# Patient Record
Sex: Female | Born: 1985 | Race: White | Hispanic: No | Marital: Single | Smoking: Never smoker
Health system: Southern US, Community
[De-identification: ages and names within clinical notes are randomized; demographics above are authoritative.]

## PROBLEM LIST (undated history)

## (undated) DIAGNOSIS — F319 Bipolar disorder, unspecified: Secondary | ICD-10-CM

## (undated) DIAGNOSIS — J189 Pneumonia, unspecified organism: Secondary | ICD-10-CM

## (undated) DIAGNOSIS — K219 Gastro-esophageal reflux disease without esophagitis: Secondary | ICD-10-CM

## (undated) DIAGNOSIS — D649 Anemia, unspecified: Secondary | ICD-10-CM

## (undated) DIAGNOSIS — T7840XA Allergy, unspecified, initial encounter: Secondary | ICD-10-CM

## (undated) DIAGNOSIS — F32A Depression, unspecified: Secondary | ICD-10-CM

## (undated) DIAGNOSIS — F419 Anxiety disorder, unspecified: Secondary | ICD-10-CM

## (undated) HISTORY — PX: TONSILLECTOMY: SUR1361

## (undated) HISTORY — DX: Depression, unspecified: F32.A

## (undated) HISTORY — PX: CHOLECYSTECTOMY: SHX55

## (undated) HISTORY — DX: Gastro-esophageal reflux disease without esophagitis: K21.9

## (undated) HISTORY — PX: TUBAL LIGATION: SHX77

## (undated) HISTORY — PX: APPENDECTOMY: SHX54

## (undated) HISTORY — PX: ABDOMINAL HYSTERECTOMY: SHX81

## (undated) HISTORY — DX: Allergy, unspecified, initial encounter: T78.40XA

---

## 2003-09-02 ENCOUNTER — Other Ambulatory Visit: Payer: Self-pay

## 2003-12-08 ENCOUNTER — Other Ambulatory Visit: Payer: Self-pay

## 2004-09-10 ENCOUNTER — Emergency Department: Payer: Self-pay | Admitting: Emergency Medicine

## 2004-10-05 ENCOUNTER — Emergency Department: Payer: Self-pay | Admitting: Internal Medicine

## 2004-12-16 ENCOUNTER — Emergency Department: Payer: Self-pay | Admitting: Emergency Medicine

## 2005-12-30 ENCOUNTER — Emergency Department: Payer: Self-pay | Admitting: Emergency Medicine

## 2007-02-09 ENCOUNTER — Emergency Department: Payer: Self-pay | Admitting: Emergency Medicine

## 2007-04-13 ENCOUNTER — Emergency Department: Payer: Self-pay | Admitting: Emergency Medicine

## 2007-04-15 ENCOUNTER — Emergency Department: Payer: Self-pay | Admitting: General Practice

## 2007-10-06 ENCOUNTER — Emergency Department: Payer: Self-pay | Admitting: Emergency Medicine

## 2008-01-13 ENCOUNTER — Ambulatory Visit: Payer: Self-pay | Admitting: Advanced Practice Midwife

## 2008-06-12 ENCOUNTER — Observation Stay: Payer: Self-pay | Admitting: Obstetrics and Gynecology

## 2008-07-03 ENCOUNTER — Inpatient Hospital Stay: Payer: Self-pay

## 2008-10-20 ENCOUNTER — Emergency Department: Payer: Self-pay | Admitting: Emergency Medicine

## 2008-12-23 ENCOUNTER — Ambulatory Visit: Payer: Self-pay | Admitting: Advanced Practice Midwife

## 2009-01-04 ENCOUNTER — Emergency Department: Payer: Self-pay | Admitting: Emergency Medicine

## 2009-04-18 ENCOUNTER — Observation Stay: Payer: Self-pay | Admitting: Unknown Physician Specialty

## 2009-05-14 ENCOUNTER — Observation Stay: Payer: Self-pay

## 2009-05-30 ENCOUNTER — Observation Stay: Payer: Self-pay | Admitting: Unknown Physician Specialty

## 2009-07-07 ENCOUNTER — Observation Stay: Payer: Self-pay | Admitting: Obstetrics & Gynecology

## 2009-07-13 ENCOUNTER — Observation Stay: Payer: Self-pay

## 2009-07-16 ENCOUNTER — Observation Stay: Payer: Self-pay | Admitting: Unknown Physician Specialty

## 2009-07-22 ENCOUNTER — Observation Stay: Payer: Self-pay | Admitting: Obstetrics and Gynecology

## 2009-07-26 ENCOUNTER — Inpatient Hospital Stay: Payer: Self-pay

## 2009-09-09 ENCOUNTER — Inpatient Hospital Stay: Payer: Self-pay | Admitting: Surgery

## 2009-10-06 ENCOUNTER — Emergency Department: Payer: Self-pay | Admitting: Emergency Medicine

## 2009-10-19 ENCOUNTER — Observation Stay: Payer: Self-pay | Admitting: Internal Medicine

## 2010-02-09 ENCOUNTER — Emergency Department: Payer: Self-pay | Admitting: Emergency Medicine

## 2010-03-03 ENCOUNTER — Emergency Department: Payer: Self-pay | Admitting: Emergency Medicine

## 2010-03-21 ENCOUNTER — Emergency Department: Payer: Self-pay | Admitting: Emergency Medicine

## 2010-03-22 ENCOUNTER — Emergency Department: Payer: Self-pay | Admitting: Emergency Medicine

## 2010-04-01 ENCOUNTER — Emergency Department: Payer: Self-pay | Admitting: Emergency Medicine

## 2010-05-01 ENCOUNTER — Emergency Department: Payer: Self-pay | Admitting: Emergency Medicine

## 2010-05-03 ENCOUNTER — Emergency Department: Payer: Self-pay | Admitting: Emergency Medicine

## 2010-05-07 ENCOUNTER — Emergency Department: Payer: Self-pay | Admitting: Emergency Medicine

## 2010-05-15 ENCOUNTER — Inpatient Hospital Stay: Payer: Self-pay | Admitting: Surgery

## 2010-05-19 LAB — PATHOLOGY REPORT

## 2010-05-22 ENCOUNTER — Emergency Department: Payer: Self-pay | Admitting: Unknown Physician Specialty

## 2010-05-31 ENCOUNTER — Emergency Department: Payer: Self-pay | Admitting: Emergency Medicine

## 2010-07-17 ENCOUNTER — Emergency Department: Payer: Self-pay | Admitting: Unknown Physician Specialty

## 2010-07-29 ENCOUNTER — Emergency Department: Payer: Self-pay | Admitting: Internal Medicine

## 2010-09-18 ENCOUNTER — Emergency Department: Payer: Self-pay | Admitting: Emergency Medicine

## 2010-10-29 ENCOUNTER — Emergency Department: Payer: Self-pay | Admitting: Emergency Medicine

## 2011-01-23 ENCOUNTER — Emergency Department: Payer: Self-pay | Admitting: Emergency Medicine

## 2011-02-02 ENCOUNTER — Emergency Department: Payer: Self-pay | Admitting: Emergency Medicine

## 2011-03-26 ENCOUNTER — Emergency Department: Payer: Self-pay | Admitting: Emergency Medicine

## 2011-03-31 ENCOUNTER — Emergency Department: Payer: Self-pay | Admitting: Emergency Medicine

## 2011-08-04 IMAGING — CT CT ABD-PELV W/ CM
1 of 3 series · 15 of 32 positions shown, 19 images · non-contrast
Comparison: none

REASON FOR EXAM: (1) r sided pain; (2) r sided pain
COMMENTS:

[Series 2: 3mm soft tissue · axial · 0.80mm/px · z∈[-769,-355]mm · 15 of 151 slices shown, 19 images]
[im 7/151  soft-tissue]
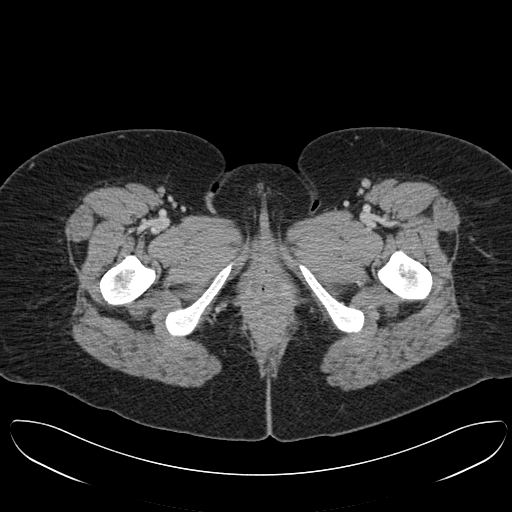
[im 7/151  bone]
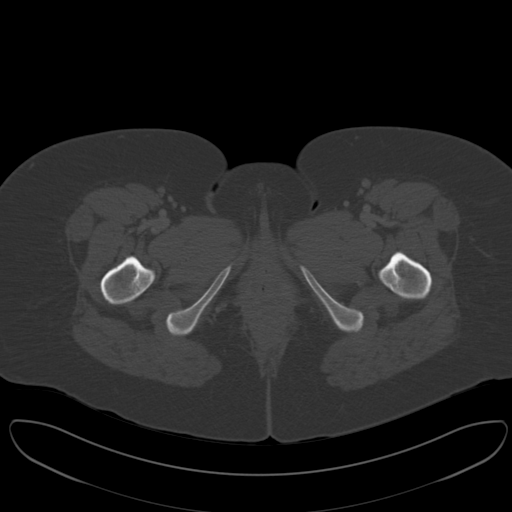
[im 19/151  soft-tissue]
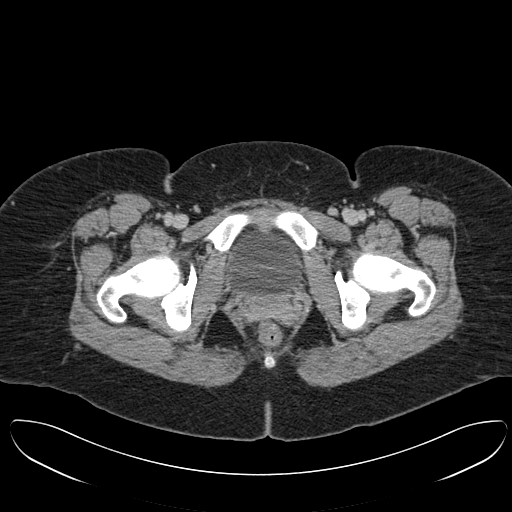
[im 31/151  soft-tissue]
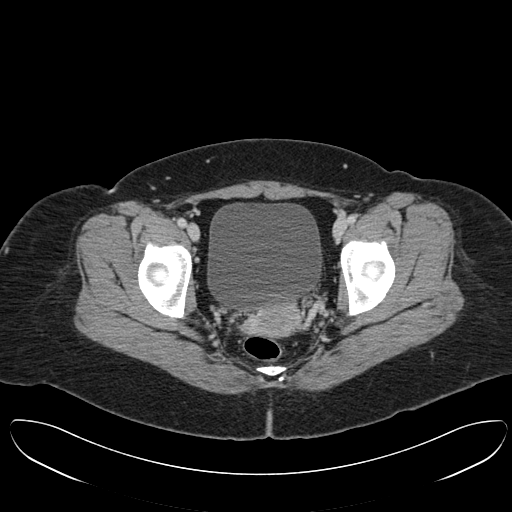
[im 43/151  soft-tissue]
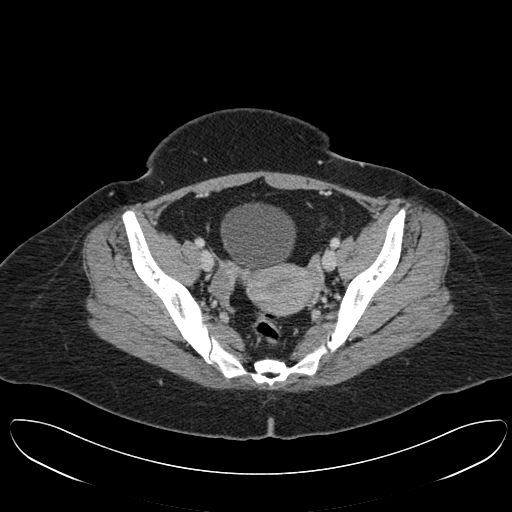
[im 55/151  soft-tissue]
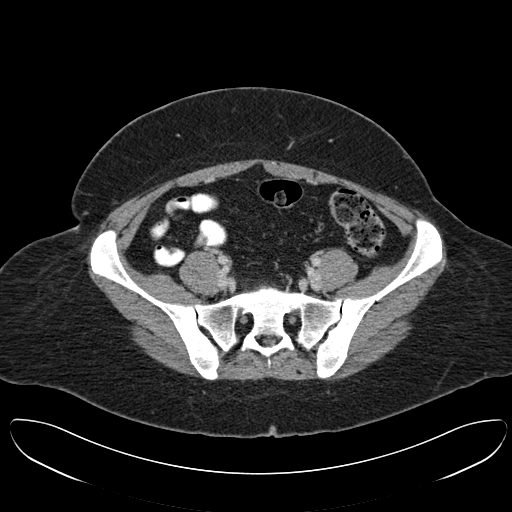
[im 67/151  soft-tissue]
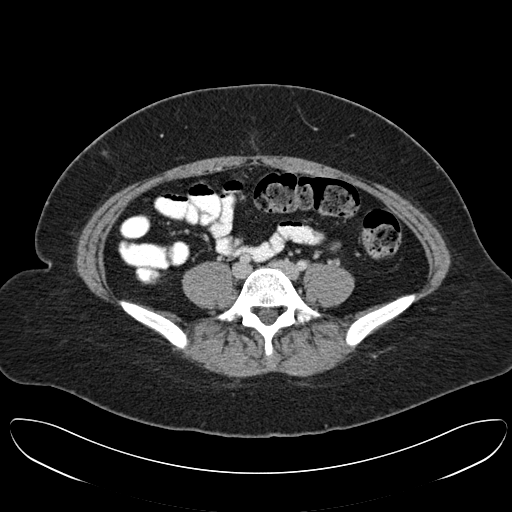
[im 79/151  soft-tissue]
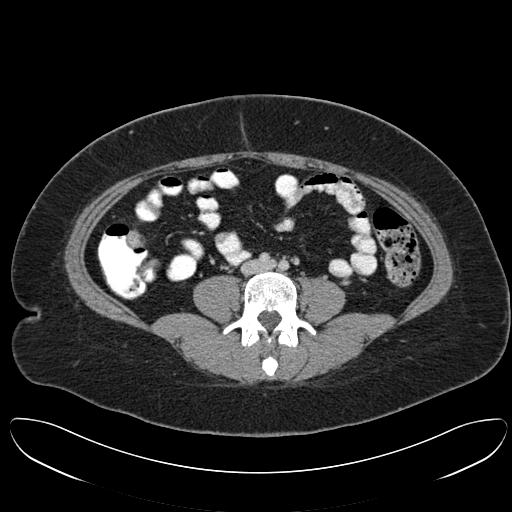
[im 85/151  soft-tissue]
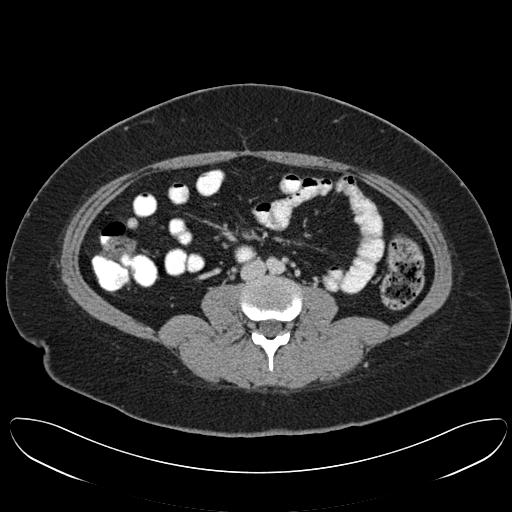
[im 97/151  soft-tissue]
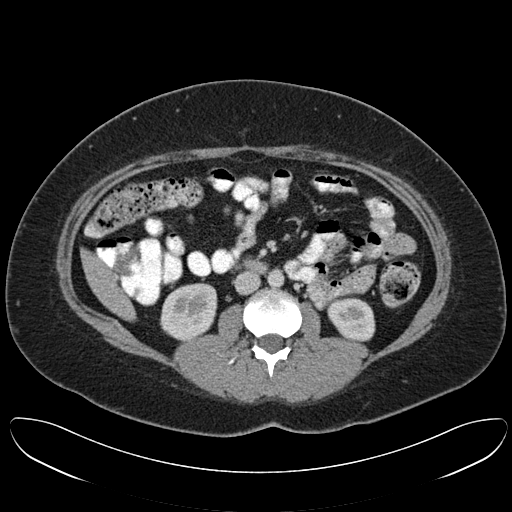
[im 97/151  bone]
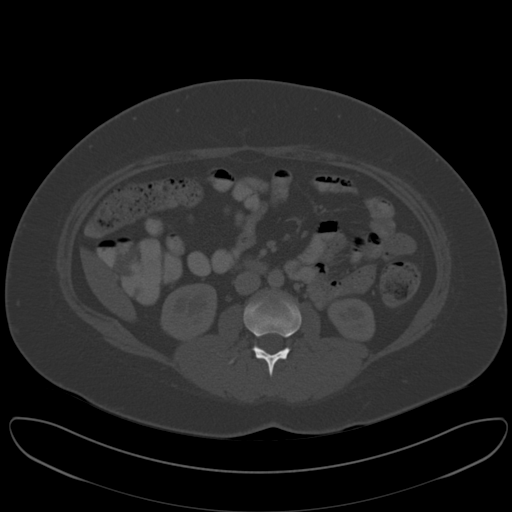
[im 109/151  soft-tissue]
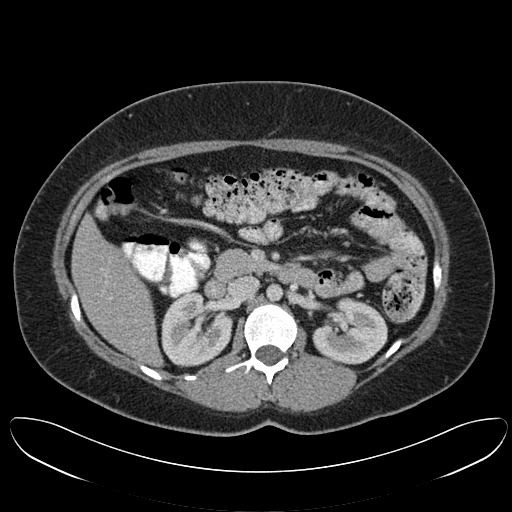
[im 121/151  soft-tissue]
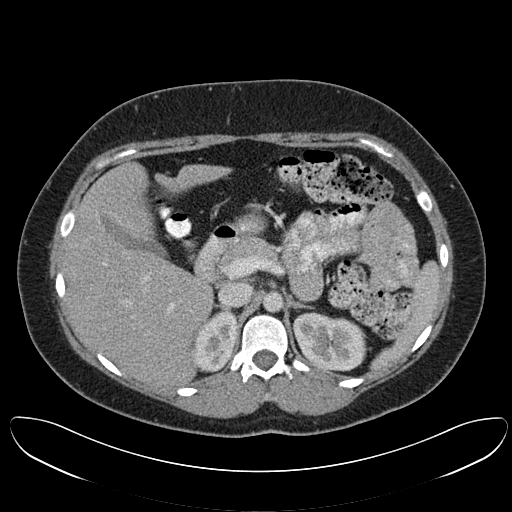
[im 127/151  lung]
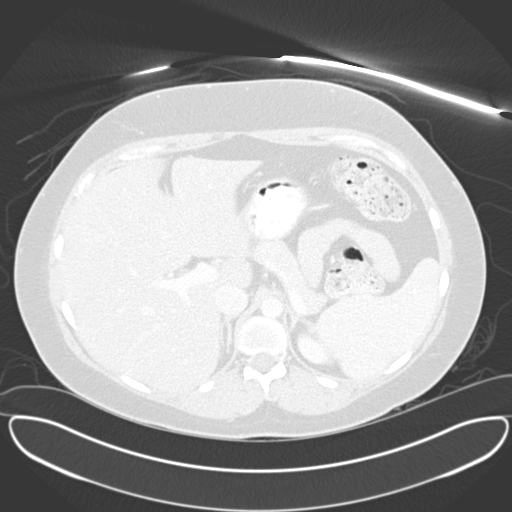
[im 133/151  soft-tissue]
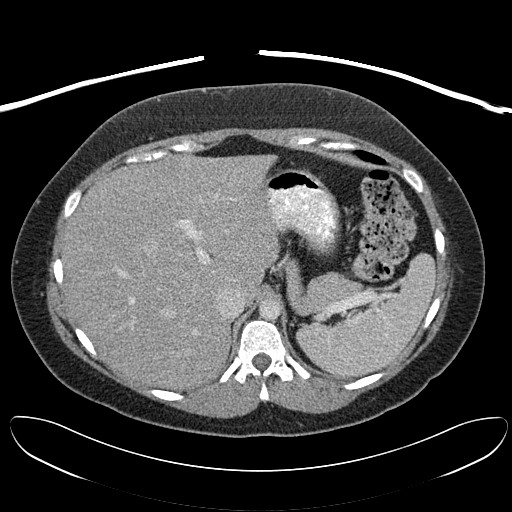
[im 133/151  lung]
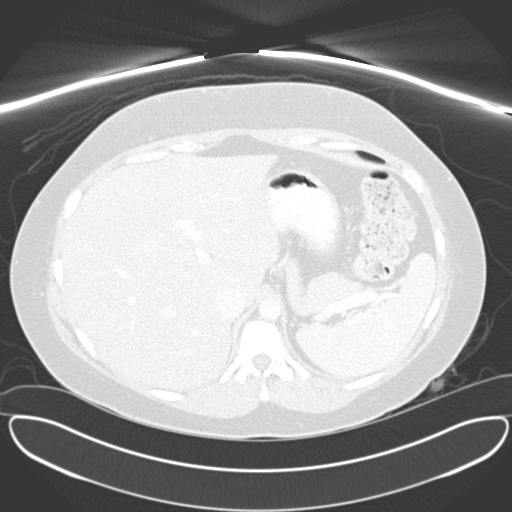
[im 139/151  lung]
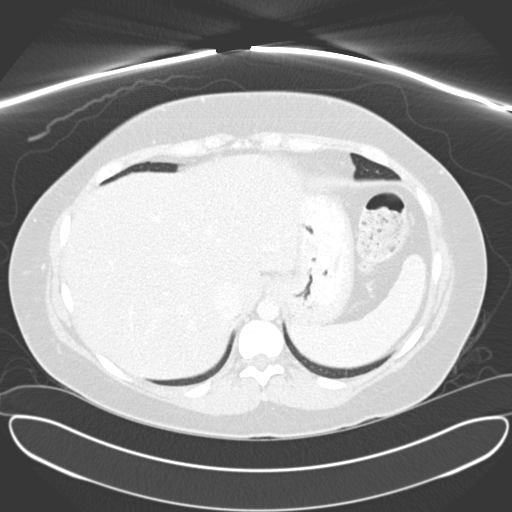
[im 145/151  soft-tissue]
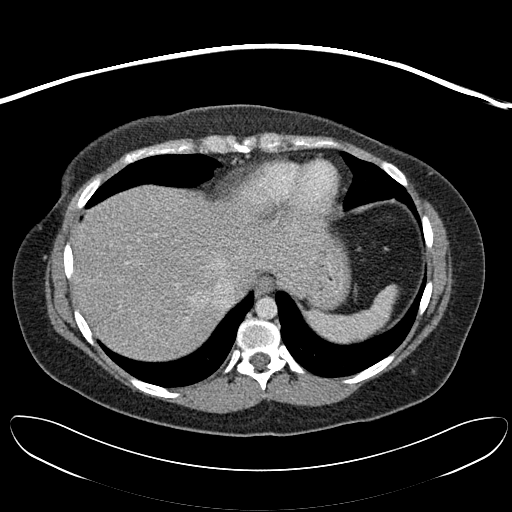
[im 145/151  lung]
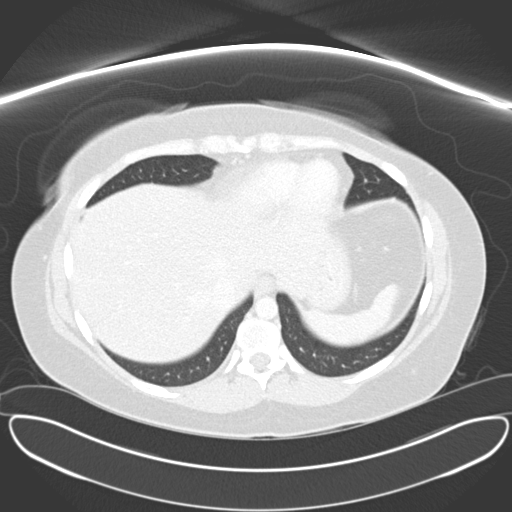

[15 of 32 positions shown; findings below may reference images not displayed]

PROCEDURE:     CT  - CT ABDOMEN / PELVIS  W  - October 19, 2009  [DATE]

RESULT:     CT of the abdomen and pelvis is performed utilizing oral
contrast and 100 ml of Msovue-LYL iodinated intravenous contrast. Images are
reconstructed in the axial plane at 3 mm slice thickness. Comparison is made
to the study of 09/09/2009.

The patient has undergone appendectomy since the previous exam. There is no
abscess or drainable loculated fluid collection. There is no significant
free fluid. The uterus and urinary bladder appear unremarkable. The adnexal
structures are within normal limits. The lung bases appear clear. The liver,
spleen, pancreas, gallbladder, adrenal glands and kidneys appear to be
unremarkable. There may be slightly decreased attenuation of the liver but
no diffuse severe fatty infiltration is present. No adrenal masses are seen.
The abdominal aorta is normal in caliber. There is no abnormal bowel
distention or abnormal bowel wall thickening. There is a moderate amount of
fecal material within the colon especially in the transverse and descending
colonic regions.
IMPRESSION: No significant abdominal or pelvic abnormality. There may
be some mild constipation. Minimal fatty infiltration of the liver is not
excluded.

## 2011-08-27 ENCOUNTER — Emergency Department: Payer: Self-pay | Admitting: *Deleted

## 2011-08-30 ENCOUNTER — Emergency Department: Payer: Self-pay | Admitting: Emergency Medicine

## 2011-09-07 ENCOUNTER — Emergency Department: Payer: Self-pay | Admitting: Emergency Medicine

## 2011-10-06 ENCOUNTER — Emergency Department: Payer: Self-pay | Admitting: Unknown Physician Specialty

## 2012-01-15 ENCOUNTER — Emergency Department: Payer: Self-pay | Admitting: Emergency Medicine

## 2012-01-15 LAB — BASIC METABOLIC PANEL
Anion Gap: 14 (ref 7–16)
BUN: 8 mg/dL (ref 7–18)
Calcium, Total: 8 mg/dL — ABNORMAL LOW (ref 8.5–10.1)
Chloride: 106 mmol/L (ref 98–107)
Creatinine: 0.44 mg/dL — ABNORMAL LOW (ref 0.60–1.30)
EGFR (African American): >60
EGFR (Non-African Amer.): >60
Potassium: 3.3 mmol/L — ABNORMAL LOW (ref 3.5–5.1)
Sodium: 139 mmol/L (ref 136–145)

## 2012-01-15 LAB — URINALYSIS, COMPLETE
Bacteria: NONE SEEN
Blood: NEGATIVE
Glucose,UR: NEGATIVE mg/dL (ref 0–75)
Leukocyte Esterase: NEGATIVE
Nitrite: NEGATIVE
Ph: 6 (ref 4.5–8.0)
Protein: 30
Specific Gravity: 1.028 (ref 1.003–1.030)
Squamous Epithelial: 3
WBC UR: 2 /HPF (ref 0–5)

## 2012-01-15 LAB — CBC
HCT: 33.9 % — ABNORMAL LOW (ref 35.0–47.0)
HGB: 11.6 g/dL — ABNORMAL LOW (ref 12.0–16.0)
MCV: 89 fL (ref 80–100)
Platelet: 247 10*3/uL (ref 150–440)
RBC: 3.82 10*6/uL (ref 3.80–5.20)
RDW: 13.9 % (ref 11.5–14.5)

## 2012-01-15 LAB — PREGNANCY, URINE: Pregnancy Test, Urine: POSITIVE m[IU]/mL

## 2012-01-16 LAB — TSH: Thyroid Stimulating Horm: 0.263 u[IU]/mL — ABNORMAL LOW

## 2012-02-07 ENCOUNTER — Observation Stay: Payer: Self-pay

## 2012-02-14 IMAGING — US ABDOMEN ULTRASOUND
1 series · 17 of 25 positions shown · non-contrast
Comparison: none

REASON FOR EXAM: FLAVIO ROBERTO abd pain
COMMENTS:   May transport without cardiac monitor

[Series 1: abdomen ultrasound · 17 of 50 slices shown]
[im 1/50]
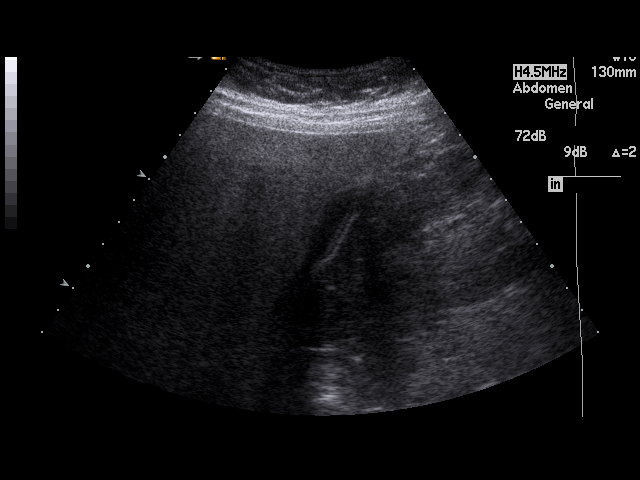
[im 5/50]
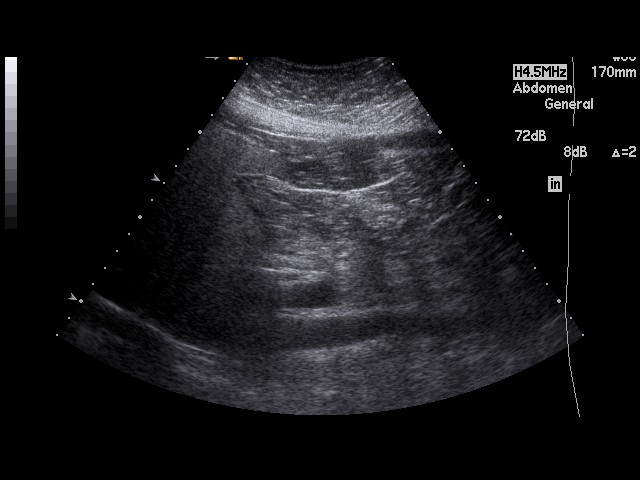
[im 7/50]
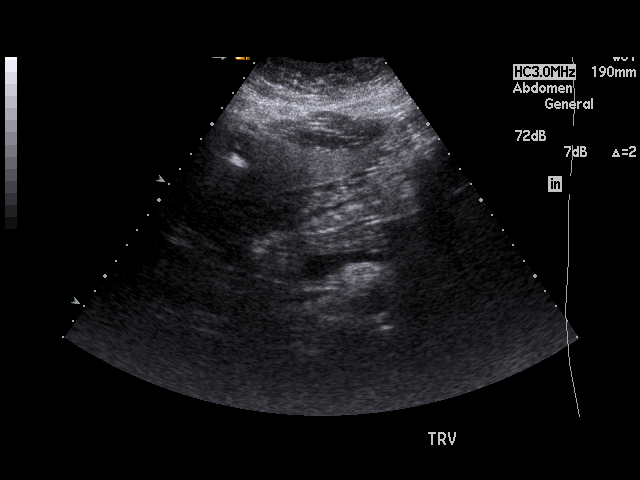
[im 11/50]
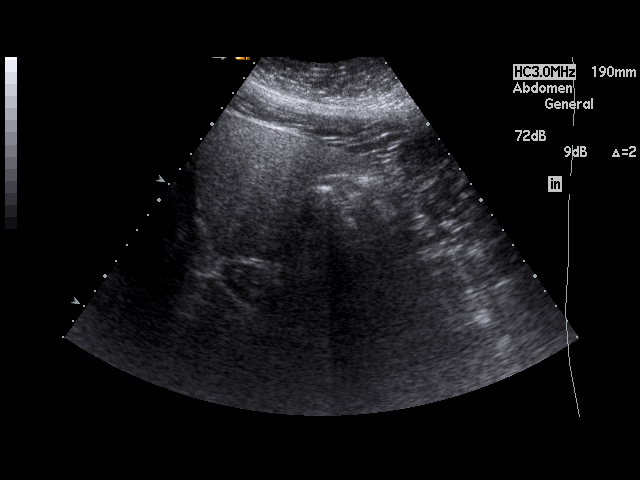
[im 13/50]
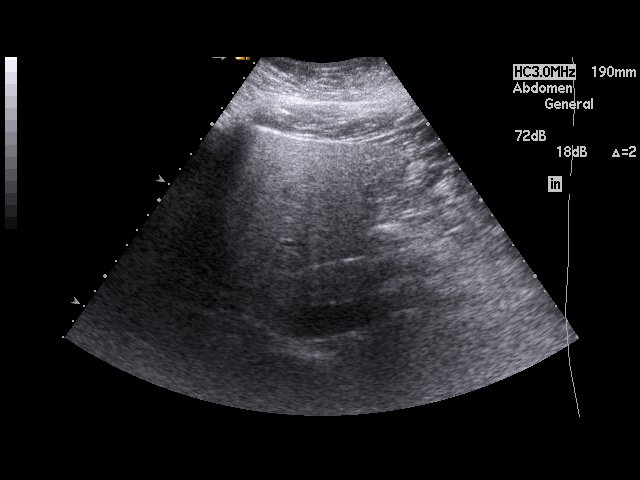
[im 17/50]
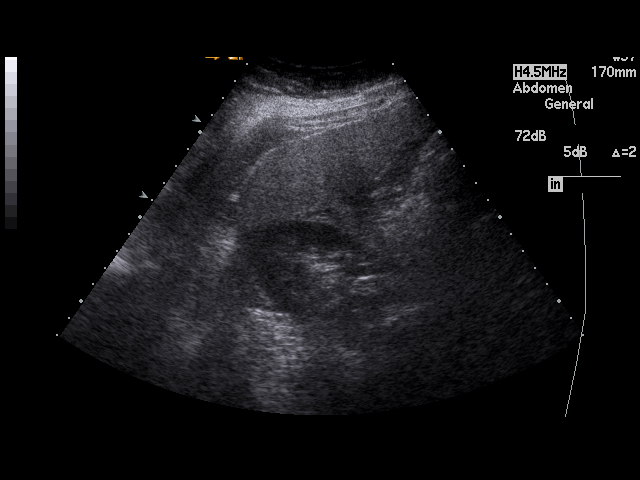
[im 19/50]
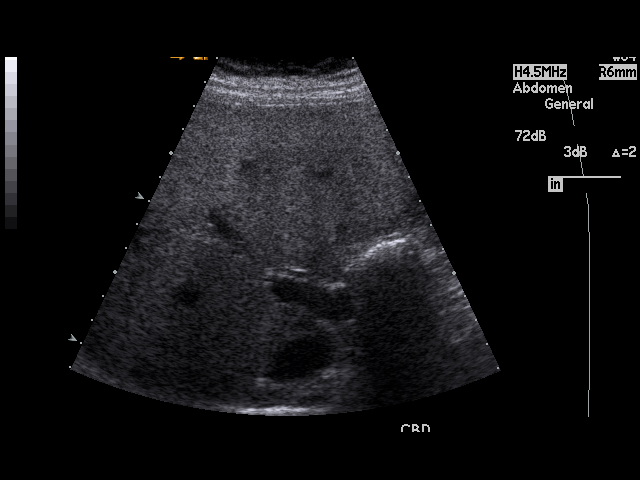
[im 23/50]
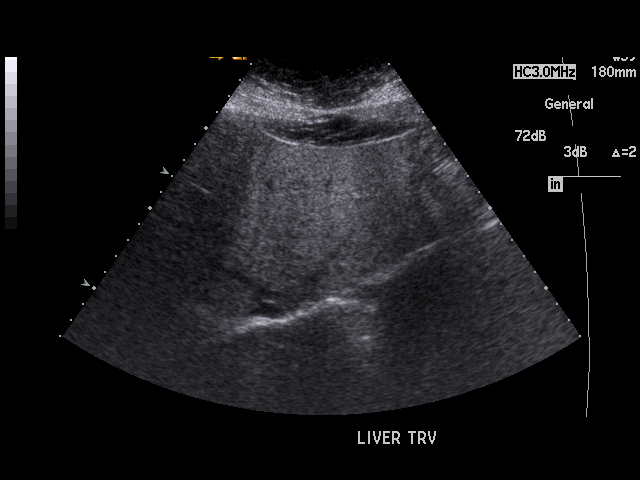
[im 25/50]
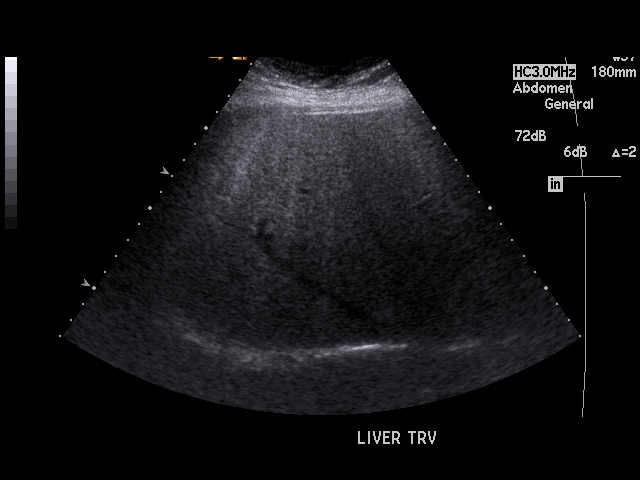
[im 27/50]
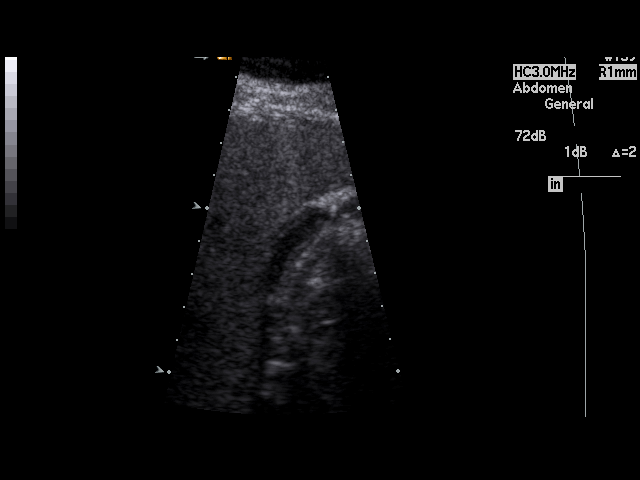
[im 31/50]
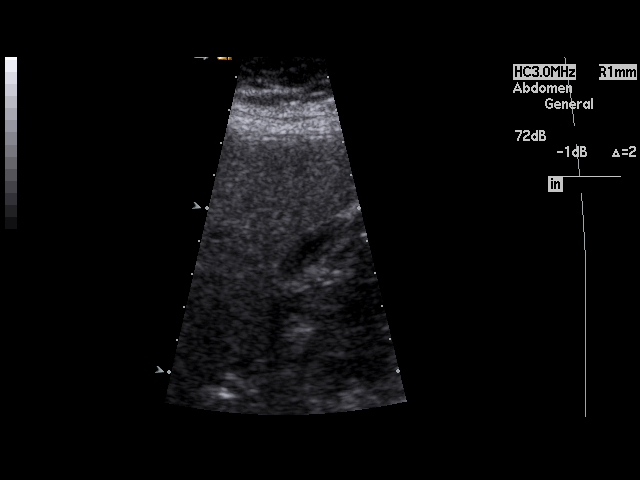
[im 33/50]
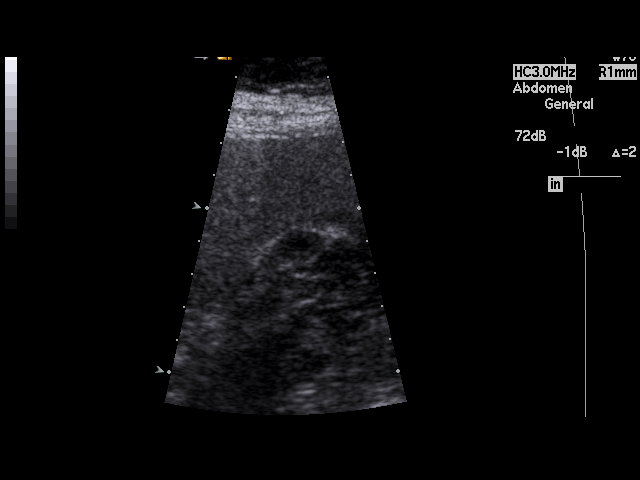
[im 37/50]
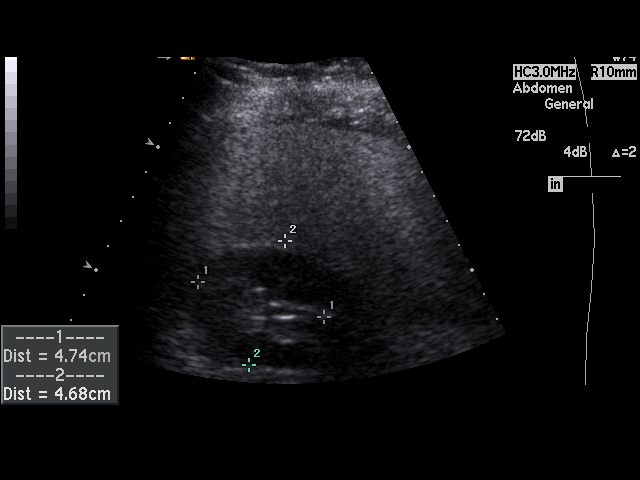
[im 39/50]
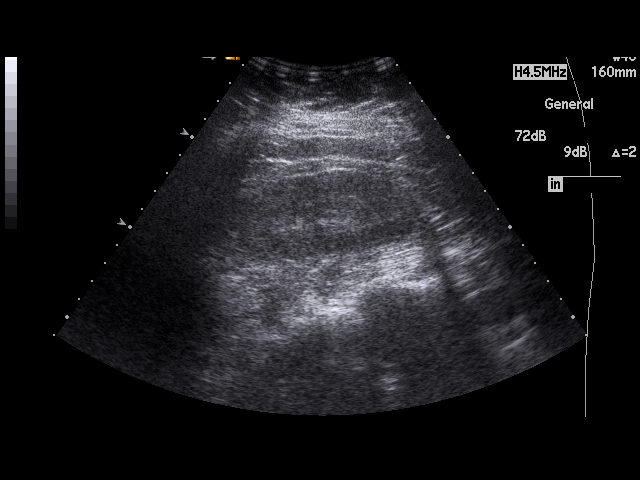
[im 43/50]
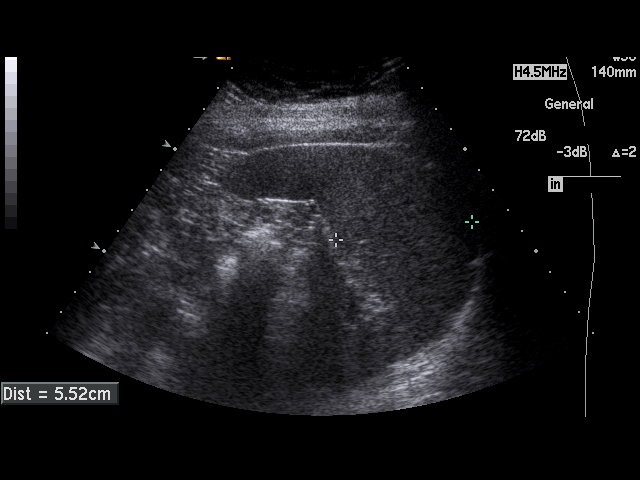
[im 45/50]
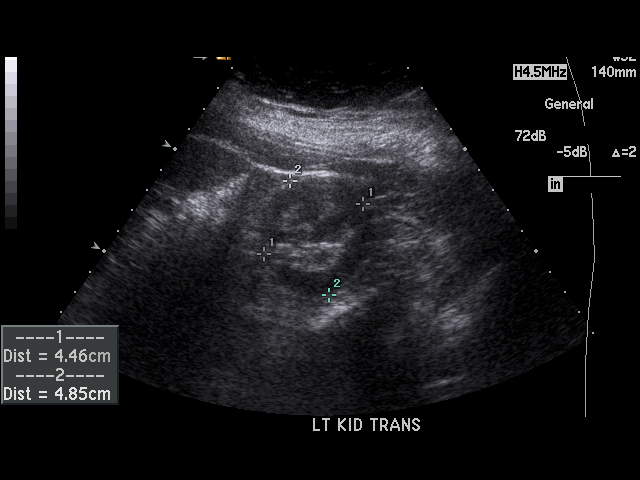
[im 50/50]
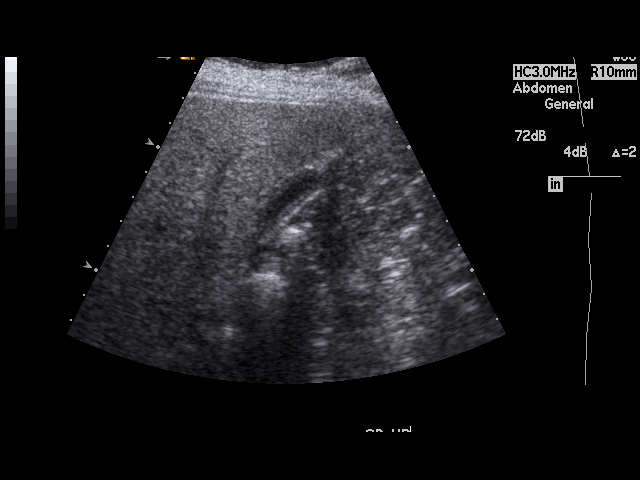

[17 of 25 positions shown; findings below may reference images not displayed]

PROCEDURE:     US  - US ABDOMEN GENERAL SURVEY  - May 01, 2010 [DATE]

RESULT:     Comparison: 09/09/2009.

Technique and Findings:
Multiple grayscale and color Doppler images were obtained of the abdomen.
The visualized aorta and IVC are normal. Visualized pancreas unremarkable.
The liver is relatively echogenic, which can be seen with fatty
infiltration. The common bile duct measures 4 mm in diameter. The
gallbladder is contracted which somewhat limits evaluation. However, there
is no gall bladder wall thickening. The sonographic Murphy's sign was
negative. Limited views of the kidneys show no hydronephrosis. Spleen is
normal in appearance.
IMPRESSION: The gallbladder is contracted, somewhat limiting evaluation. However, no
ultrasound evidence of cholecystitis.

## 2012-02-17 IMAGING — CT CT ABD-PELV W/ CM
1 of 2 series · 15 of 32 positions shown, 19 images · IV contrast (isovue)
Comparison: 10/19/2009

REASON FOR EXAM: (1) severe RUQ abdominal pain; (2) severe abdominal pain
COMMENTS:

PROCEDURE:     CT  - CT ABDOMEN / PELVIS  W  - May 04, 2010  [DATE]
RESULT:     History: Right upper quadrant pain
TECHNIQUE: Multiple axial images of the abdomen and pelvis were performed
from the lung bases to the pubic symphysis, with p.o. contrast and with 100
ml of Isovue 370 intravenous contrast.

[Series 2: abdomen · axial · 0.83mm/px · z∈[-503,-23]mm · 15 of 104 slices shown, 19 images]
[im 4/104  soft-tissue]
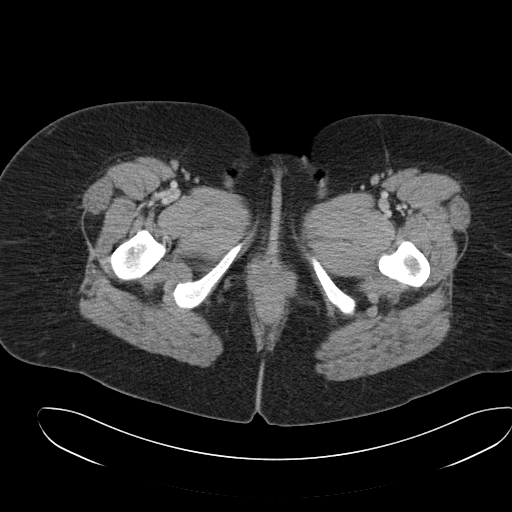
[im 4/104  bone]
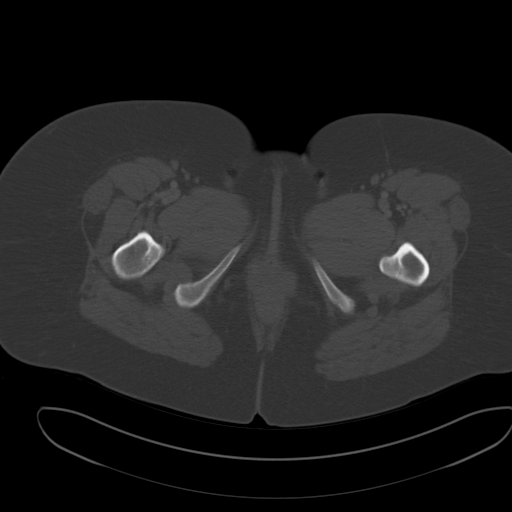
[im 12/104  soft-tissue]
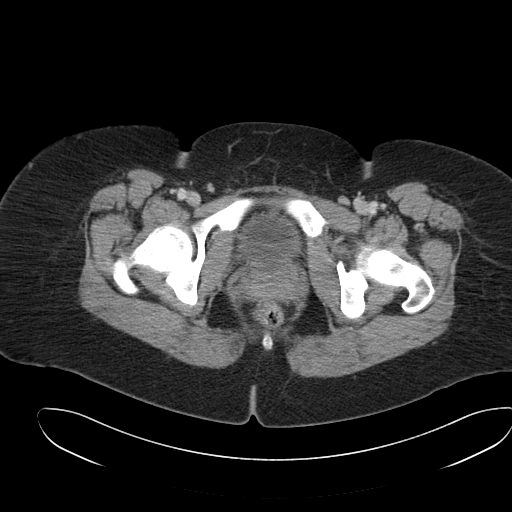
[im 20/104  soft-tissue]
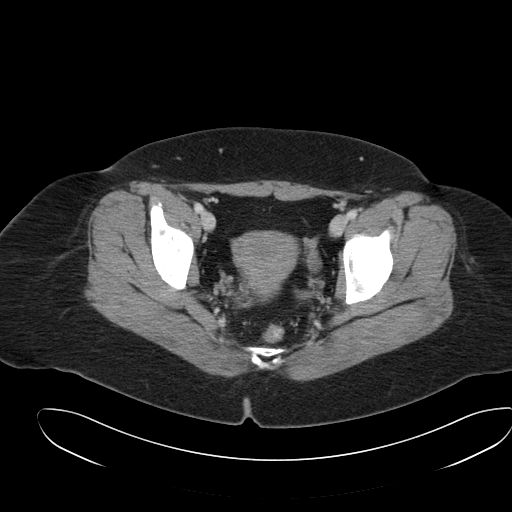
[im 28/104  soft-tissue]
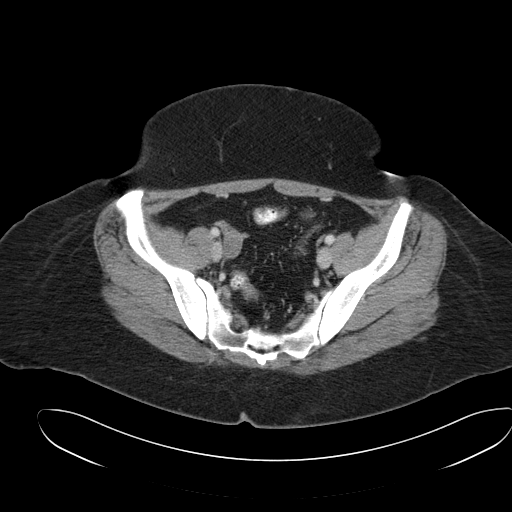
[im 36/104  soft-tissue]
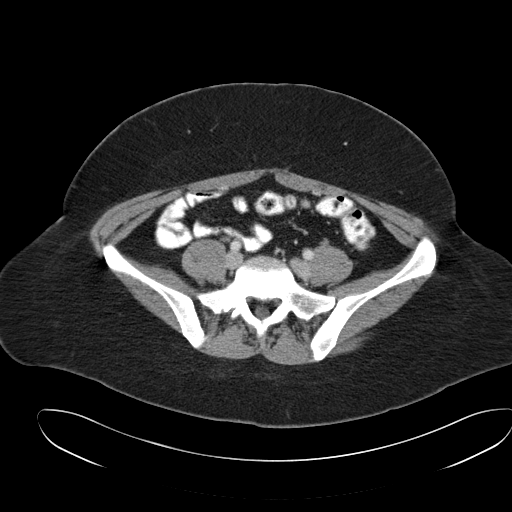
[im 44/104  soft-tissue]
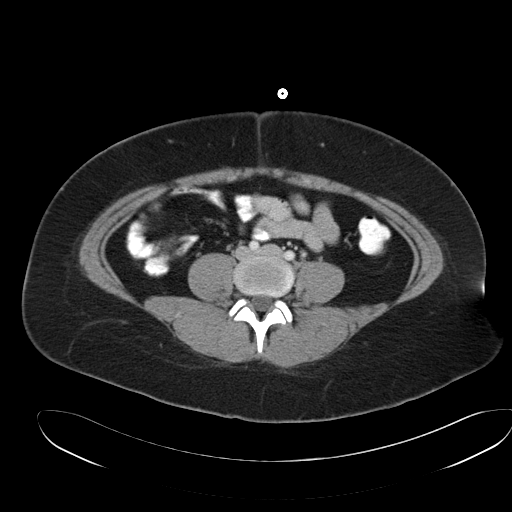
[im 52/104  soft-tissue]
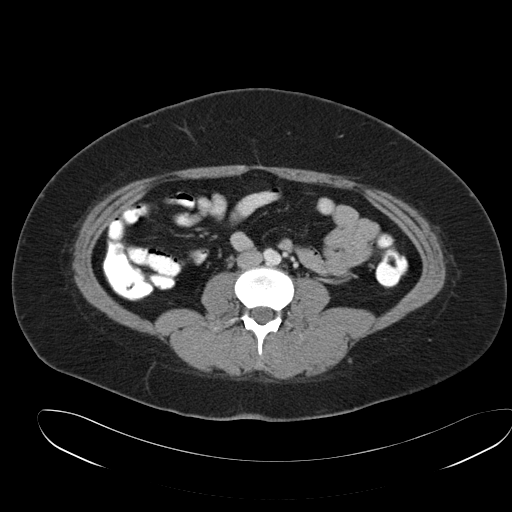
[im 60/104  soft-tissue]
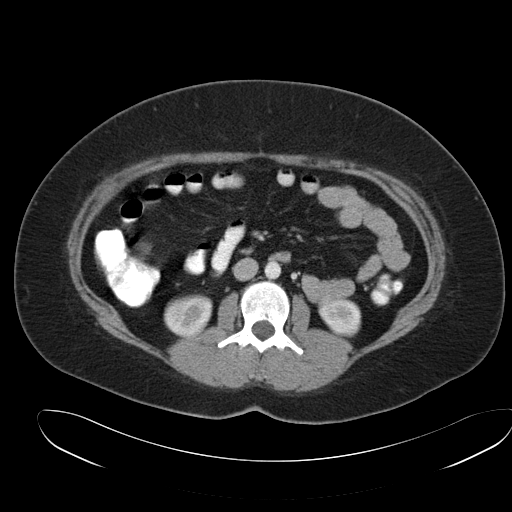
[im 68/104  soft-tissue]
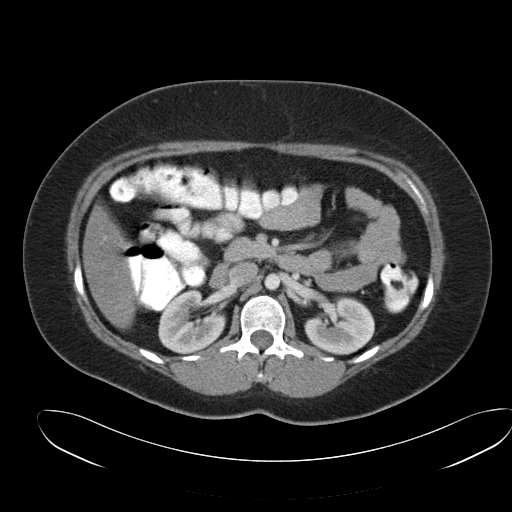
[im 68/104  bone]
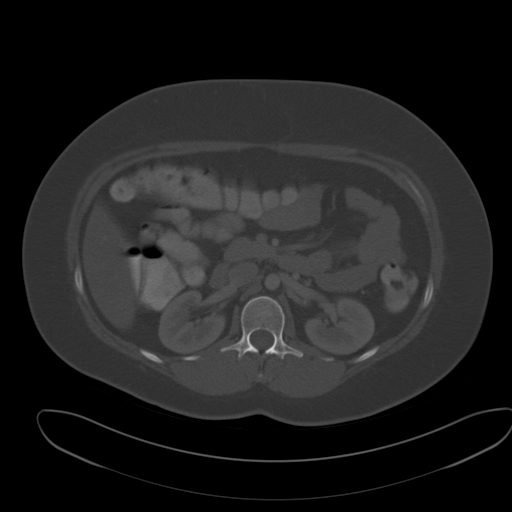
[im 76/104  soft-tissue]
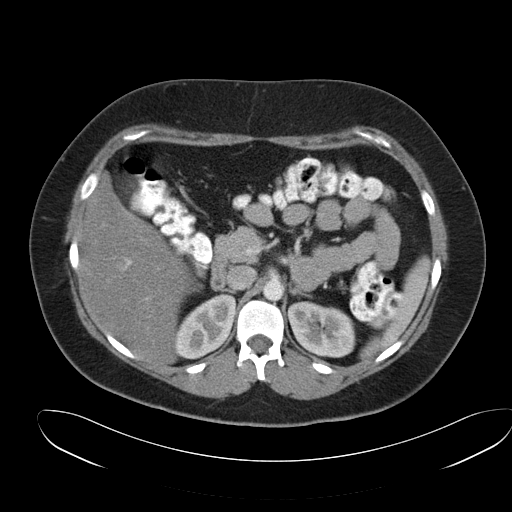
[im 84/104  soft-tissue]
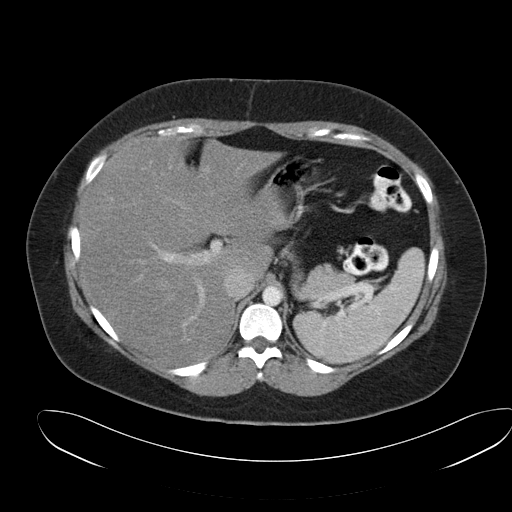
[im 88/104  lung]
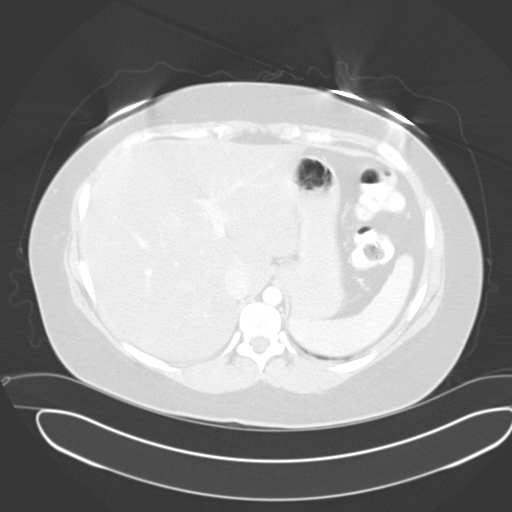
[im 92/104  soft-tissue]
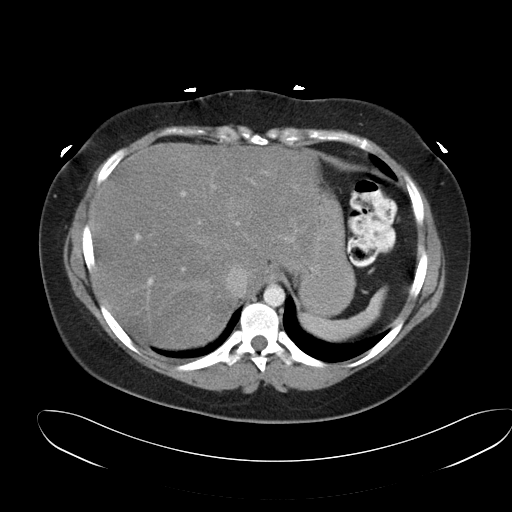
[im 92/104  lung]
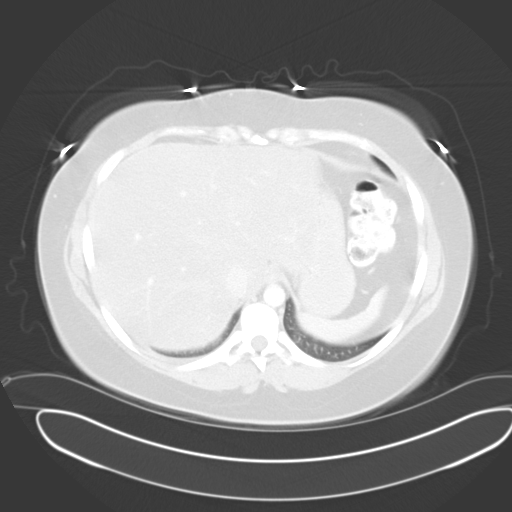
[im 96/104  lung]
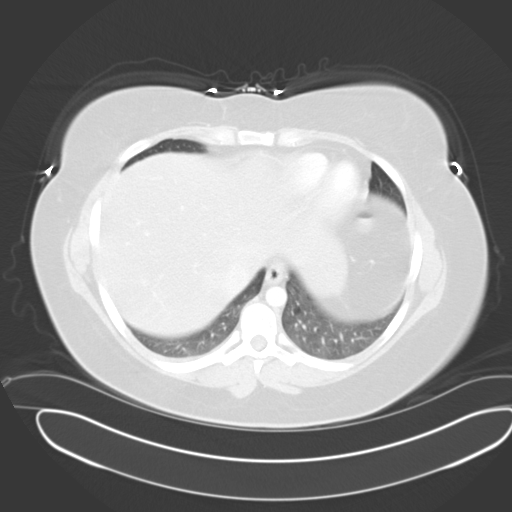
[im 100/104  soft-tissue]
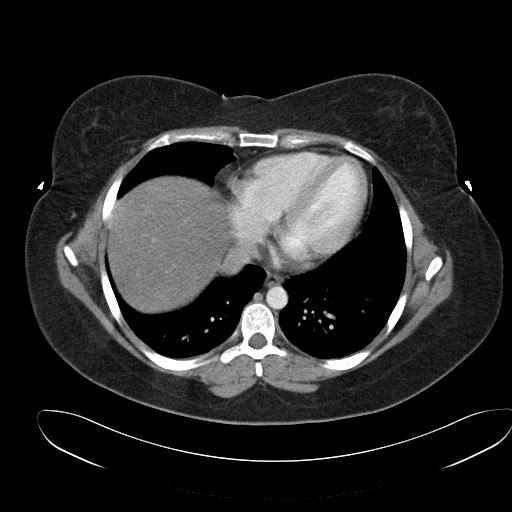
[im 100/104  lung]
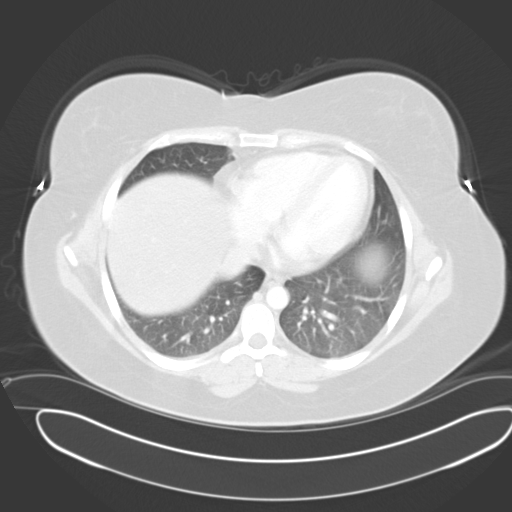

[15 of 32 positions shown; findings below may reference images not displayed]

FINDINGS: The lung bases are clear. There is no pneumothorax. The heart size is
normal.

The liver is diffusely low in attenuation likely secondary to hepatic
steatosis. There is a 5 mm echogenic focus within the distal common bile
duct at the level of the pancreatic head which may be artifactual versus
choledocholithiasis. There is no intrahepatic or extrahepatic biliary ductal
dilatation. The gallbladder is unremarkable. The spleen demonstrates no
focal abnormality. The kidneys, adrenal glands, and pancreas are normal. The
bladder is unremarkable.

The stomach, duodenum, small intestine, and large intestine demonstrate no
contrast extravasation or dilatation.  There is no pneumoperitoneum,
pneumatosis, or portal venous gas. There is no abdominal or pelvic free
fluid. There is no lymphadenopathy.

The abdominal aorta is normal in caliber.

The osseous structures are unremarkable.
IMPRESSION: There is a 5 mm echogenic focus within the distal common bile duct at the
level of the pancreatic head which may be artifactual versus
choledocholithiasis. Recommend further evaluation with M.R.C.P.

Hepatic steatosis.

## 2012-03-06 ENCOUNTER — Observation Stay: Payer: Self-pay | Admitting: Obstetrics and Gynecology

## 2012-03-23 ENCOUNTER — Observation Stay: Payer: Self-pay | Admitting: Advanced Practice Midwife

## 2012-03-23 LAB — URINALYSIS, COMPLETE
Bilirubin,UR: NEGATIVE
Blood: NEGATIVE
Glucose,UR: NEGATIVE mg/dL (ref 0–75)
Ketone: NEGATIVE
Leukocyte Esterase: NEGATIVE
Protein: NEGATIVE

## 2012-03-26 ENCOUNTER — Observation Stay: Payer: Self-pay | Admitting: Obstetrics and Gynecology

## 2012-03-30 ENCOUNTER — Observation Stay: Payer: Self-pay | Admitting: Obstetrics and Gynecology

## 2012-04-04 ENCOUNTER — Observation Stay: Payer: Self-pay

## 2012-04-08 ENCOUNTER — Observation Stay: Payer: Self-pay

## 2012-04-08 LAB — PIH PROFILE
Calcium, Total: 9.5 mg/dL (ref 8.5–10.1)
Creatinine: 0.54 mg/dL — ABNORMAL LOW (ref 0.60–1.30)
EGFR (African American): >60
EGFR (Non-African Amer.): >60
HGB: 11.4 g/dL — ABNORMAL LOW (ref 12.0–16.0)
MCH: 28.8 pg (ref 26.0–34.0)
MCHC: 32.9 g/dL (ref 32.0–36.0)
MCV: 88 fL (ref 80–100)
Osmolality: 275 (ref 275–301)
RBC: 3.95 10*6/uL (ref 3.80–5.20)
RDW: 15 % — ABNORMAL HIGH (ref 11.5–14.5)
SGOT(AST): 12 U/L — ABNORMAL LOW (ref 15–37)
Sodium: 140 mmol/L (ref 136–145)
Uric Acid: 4.3 mg/dL (ref 2.6–6.0)
WBC: 10.2 10*3/uL (ref 3.6–11.0)

## 2012-04-08 LAB — PROTEIN / CREATININE RATIO, URINE
Protein, Random Urine: 8 mg/dL (ref 0–12)
Protein/Creat. Ratio: 303 mg/gCREAT — ABNORMAL HIGH (ref 0–200)

## 2012-04-12 ENCOUNTER — Observation Stay: Payer: Self-pay

## 2012-04-17 ENCOUNTER — Observation Stay: Payer: Self-pay | Admitting: Obstetrics and Gynecology

## 2012-04-21 ENCOUNTER — Observation Stay: Payer: Self-pay | Admitting: Obstetrics and Gynecology

## 2012-04-26 ENCOUNTER — Inpatient Hospital Stay: Payer: Self-pay

## 2012-04-26 LAB — CBC WITH DIFFERENTIAL/PLATELET
Basophil #: 0 10*3/uL (ref 0.0–0.1)
Eosinophil #: 0.1 10*3/uL (ref 0.0–0.7)
Eosinophil %: 1.3 %
HGB: 11.3 g/dL — ABNORMAL LOW (ref 12.0–16.0)
Lymphocyte #: 2.9 10*3/uL (ref 1.0–3.6)
Lymphocyte %: 25.2 %
MCH: 28.8 pg (ref 26.0–34.0)
MCHC: 33.2 g/dL (ref 32.0–36.0)
MCV: 87 fL (ref 80–100)
Monocyte %: 6.6 %
Neutrophil #: 7.7 10*3/uL — ABNORMAL HIGH (ref 1.4–6.5)
Neutrophil %: 66.5 %
RBC: 3.92 10*6/uL (ref 3.80–5.20)
WBC: 11.6 10*3/uL — ABNORMAL HIGH (ref 3.6–11.0)

## 2012-04-27 LAB — HEMATOCRIT: HCT: 32.2 % — ABNORMAL LOW (ref 35.0–47.0)

## 2012-10-04 ENCOUNTER — Emergency Department: Payer: Self-pay | Admitting: Emergency Medicine

## 2012-10-04 LAB — CK TOTAL AND CKMB (NOT AT ARMC)
CK, Total: 77 U/L (ref 21–215)
CK-MB: <0.5 ng/mL — ABNORMAL LOW (ref 0.5–3.6)

## 2012-10-04 LAB — COMPREHENSIVE METABOLIC PANEL
Albumin: 4 g/dL (ref 3.4–5.0)
Alkaline Phosphatase: 114 U/L (ref 50–136)
Anion Gap: 8 (ref 7–16)
Bilirubin,Total: 0.3 mg/dL (ref 0.2–1.0)
Chloride: 108 mmol/L — ABNORMAL HIGH (ref 98–107)
Co2: 24 mmol/L (ref 21–32)
Creatinine: 0.69 mg/dL (ref 0.60–1.30)
Osmolality: 278 (ref 275–301)
Potassium: 3.9 mmol/L (ref 3.5–5.1)
SGOT(AST): 30 U/L (ref 15–37)
Total Protein: 7.9 g/dL (ref 6.4–8.2)

## 2012-10-04 LAB — CBC
HCT: 39.2 % (ref 35.0–47.0)
HGB: 13.9 g/dL (ref 12.0–16.0)
MCHC: 35.4 g/dL (ref 32.0–36.0)
Platelet: 296 10*3/uL (ref 150–440)
RBC: 4.61 10*6/uL (ref 3.80–5.20)

## 2012-10-04 LAB — TROPONIN I: Troponin-I: <0.02 ng/mL

## 2012-12-08 ENCOUNTER — Emergency Department: Payer: Self-pay | Admitting: Emergency Medicine

## 2012-12-08 LAB — URINALYSIS, COMPLETE
Bilirubin,UR: NEGATIVE
Nitrite: NEGATIVE
Ph: 7 (ref 4.5–8.0)
Protein: 100
Specific Gravity: 1.019 (ref 1.003–1.030)
Squamous Epithelial: 17

## 2012-12-08 LAB — CBC
HCT: 39.8 % (ref 35.0–47.0)
MCH: 29 pg (ref 26.0–34.0)
MCHC: 33.3 g/dL (ref 32.0–36.0)
Platelet: 307 10*3/uL (ref 150–440)
RBC: 4.57 10*6/uL (ref 3.80–5.20)
RDW: 14.6 % — ABNORMAL HIGH (ref 11.5–14.5)
WBC: 9.5 10*3/uL (ref 3.6–11.0)

## 2012-12-08 LAB — WET PREP, GENITAL

## 2013-02-08 ENCOUNTER — Emergency Department: Payer: Self-pay | Admitting: Emergency Medicine

## 2013-05-27 ENCOUNTER — Emergency Department: Payer: Self-pay | Admitting: Emergency Medicine

## 2013-07-17 ENCOUNTER — Emergency Department: Payer: Self-pay | Admitting: Emergency Medicine

## 2014-06-12 ENCOUNTER — Emergency Department: Payer: Self-pay | Admitting: Emergency Medicine

## 2014-08-01 ENCOUNTER — Emergency Department: Payer: Self-pay | Admitting: Emergency Medicine

## 2015-02-12 NOTE — Op Note (Signed)
PATIENT NAME:  Melanie Whitaker, Melanie Whitaker MR#:  [id number] DATE OF BIRTH:  Nov 03, 1985  DATE OF PROCEDURE:  04/27/2012  PREOPERATIVE DIAGNOSIS: Desires sterility.   POSTOPERATIVE DIAGNOSIS: Desires sterility.   PROCEDURE: Postpartum tubal ligation, modified [place] method.   SURGEON: Donzetta Matters, M.D.   ASSISTANT: None.   ANESTHESIA:  General.   ESTIMATED BLOOD LOSS: Minimal.  OPERATIVE FLUIDS: 300 mL.  COMPLICATIONS: None.   FINDINGS: Normal-appearing tubes.  SPECIMEN TYPE: Portion of right and left tube.   INDICATIONS: The patient is a 29 year old who is postpartum day one from vaginal delivery who desires sterility. Risks, benefits, indications, and alternatives of the procedure were explained and informed consent was obtained.   PROCEDURE: The patient was taken to the Operating Room with IV fluids running. She was prepped and draped in the usual sterile fashion in the supine position. The infraumbilical region was injected with 0.5% Sensorcaine. A horizontal infraumbilical incision was made. The fascia was grasped with Kocher's and entered sharply. Two pieces of #0 Vicryl were placed on the fascia for repair at the end of the case. The peritoneum was entered bluntly. The patient's left tube was grasped with a Babcock and followed up to the fimbriated end. The Kary Kos was used to grasp an area 2 to 3 cm lateral to the uterine cornu where an avascular window in the mesosalpinx was identified and entered with Bovie cautery. Two pieces of plain gut suture were passed through this opening and the tube was tied 2 to 3 cm lateral to the uterine cornu. The tube was cut. The cut edge was made hemostatic and the tube was returned to the abdomen. This was repeated on the patient's right tube. The previously placed #0 Vicryl was used to repair the fascia and the skin was closed with 4-0 Vicryl in a subcuticular fashion. The patient tolerated the procedure well. Sponge, needle and instrument counts were  correct x2. The patient was awakened from anesthesia and taken to the recovery room in stable condition.  ____________________________ Rolm Gala Ferne Reus, MD law:ap D: 04/27/2012 16:26:06 ET T: 04/28/2012 08:28:31 ET JOB#: [id number]  cc: Sherlynn Carbon A. Ferne Reus, MD, <Dictator>  Rolm Gala WEAVER LEE MD ELECTRONICALLY SIGNED 06/05/2012 10:22

## 2015-02-28 NOTE — H&P (Signed)
L&D Evaluation:  History Expanded:   HPI 29 yo G3P2002 with EDD of 04/24/12 per 17 week [place]. Presented with c/o contractions at 35 1/7 weeks. Per RN her cervix was 1.5 cm. Ctx resolved for about 1 hour after terbutaline injection and then resumed. IV fluid bolus given and stadol injection. Contractions are currently sporadic, pt feeling "drunk" from stadol, but does not feel intense contractions. Erythromycin 900 mg given IV for GBS prophylaxis. + FM, no VB.    Blood Type O positive    Group B Strep Results (Result >5wks must be treated as unknown) unknown/result > 5 weeks ago     Maternal HIV Negative    Maternal Syphilis Ab Nonreactive    Maternal Varicella Immune    Rubella Results immune    Maternal T-Dap Immune    Presents with contractions    Patient's Medical History depression with hospitalization, HSV     Patient's Surgical History tonsillectomy     Medications Pre Natal Vitamins     Allergies PCN, Sulfa, Amoxicillin, Phenergan    Social History none    ROS:   ROS see HPI   Exam:   Vital Signs stable     General no apparent distress    Mental Status clear     Abdomen gravid, non-tender    Pelvic no external lesions, 1.5-2/80/-2    Mebranes Intact    FHT normal rate with no decels    Ucx irregular   Impression:   Impression preterm labor - resolved   Plan:   Plan discharge   Electronic Signatures: Ander Purpura (CNM)  (Signed 03-Jun-13 08:52)  Authored: L&D Evaluation   Last Updated: 03-Jun-13 08:52 by Ander Purpura (CNM)

## 2015-02-28 NOTE — H&P (Signed)
L&D Evaluation:  History Expanded:   HPI 29 yo [facility] patient who comes in to labor and delivery thinking she is in labor cervix 2-3 but contracting. 37 mweeks Pt has a history of depression with suicide attempt.no prob since 20054, stable bow, but set up for PP depression, she haS a history of herpes, she has been on Valtrex for 2 weeks and does not currently have an outbreak Desires BTL,    [place] 3    Term 2    PreTerm 0    Abortion 0    Living 2    Blood Type O positive    Group B Strep Results (Result >5wks must be treated as unknown) negative    Maternal HIV Negative    Maternal Syphilis Ab Nonreactive    Maternal Varicella Immune    Rubella Results immune    Maternal T-Dap Immune    [facility] 24-Apr-2012    Presents with contractions    Patient's Medical History No Chronic Illness    Patient's Surgical History Appendectomy  Colecystectomy  tonsils    Medications Pre Natal Vitamins    Allergies PCN, Sulfa, phenergan causes hallucinations    Social History none    Family History Non-Contributory   ROS:   ROS All systems were reviewed.  HEENT, CNS, GI, GU, Respiratory, CV, Renal and Musculoskeletal systems were found to be normal.   Exam:   Vital Signs stable    General no apparent distress    Mental Status clear    Chest clear    Heart normal sinus rhythm    Abdomen gravid, tender with contractions    Estimated Fetal Weight Small for gestational age    Fetal Position vertex    Pelvic no external lesions, 2-3    Mebranes Intact    FHT normal rate with no decels   Impression:   Impression early labor   Plan:   Plan monitor contractions and for cervical change    Comments allow to labor and see if cervix changes.    Follow Up Appointment already scheduled   Electronic Signatures: Erik Obey (MD)  (Signed 07-Jul-13 17:24)  Authored: L&D Evaluation   Last Updated: 07-Jul-13 17:24 by Erik Obey (MD)

## 2015-02-28 NOTE — H&P (Signed)
L&D Evaluation:  History Expanded:   HPI 29 yo G3P2002 with EDD of 04/24/12 per 17 week [place]. Presented at 37 4/7 weeks for [facility] evaluation from [facility]. BP 140/100 and 136/90 today at office with 1+ protein urine dip, recent PC Ratio around 300 (per report, lab not available for review today). Pt denies ha, reports occasional "spots" in vision. Decreased but regular FM. Also c/o regular, mild-moderate contractions. No LOF of VB. PNC at [facility] notable for early entry to care, elevated TSH with normal repeat, elevated 1 hr with normal 3 hour and labile BPs, HSV - taking Acyclovir for prophylaxis. OB Hx: SVD x 2    Blood Type O positive    Group B Strep Results (Result >5wks must be treated as unknown) unknown/result > 5 weeks ago    Maternal HIV Negative    Maternal Syphilis Ab Nonreactive    Maternal Varicella Immune    Rubella Results immune    Maternal T-Dap Immune    Presents with contractions    Patient's Medical History depression with hospitalization, HSV    Patient's Surgical History Appendectomy  Colecystectomy  tonsillectomy    Medications Pre Natal Vitamins  Acyclovir    Allergies PCN, Sulfa, Amoxicillin, Phenergan    Social History none   ROS:   ROS see HPI   Exam:   Vital Signs BP 107/73, 4113/76, 115/75, 126/64, 141/91, 142/85    General no apparent distress    Mental Status clear    Chest clear    Heart no murmur/gallop/rubs    Abdomen gravid, non-tender    Edema no edema    Reflexes 2+    Clonus negative    Pelvic no external lesions, 3/75/-2, ballotable    Mebranes Intact    FHT normal rate with no decels, BL 135, mod variability, + accels    Ucx regular, q 2-3 min    Other PC Ratio: 303   Impression:   Impression early labor, evaluation for PIH   Plan:   Plan discharge    Comments Modified bedrest for the next 2 days Pre-e precautions    Follow Up Appointment need to schedule. [facility] on 6/21 for BP check   Electronic  Signatures: Ander Purpura (CNM)  (Signed 19-Jun-13 20:02)  Authored: L&D Evaluation   Last Updated: 19-Jun-13 20:02 by Ander Purpura (CNM)

## 2016-12-12 ENCOUNTER — Encounter: Payer: Self-pay | Admitting: Emergency Medicine

## 2016-12-12 ENCOUNTER — Emergency Department: Payer: Self-pay

## 2016-12-12 ENCOUNTER — Emergency Department
Admission: EM | Admit: 2016-12-12 | Discharge: 2016-12-13 | Disposition: A | Discharge status: Home / Self Care | Payer: Self-pay | Attending: Emergency Medicine | Admitting: Emergency Medicine

## 2016-12-12 DIAGNOSIS — J111 Influenza due to unidentified influenza virus with other respiratory manifestations: Secondary | ICD-10-CM | POA: Insufficient documentation

## 2016-12-12 NOTE — ED Triage Notes (Signed)
Pt ambulatory to triage in NAD, report cough since Monday, productive with yellow sputum, reports sore throat, rib and back pain.

## 2016-12-13 MED ORDER — HYDROCOD POLST-CPM POLST ER 10-8 MG/5ML PO SUER
5.0000 mL | Freq: Once | ORAL | Status: AC
  Administered 2016-12-13: 5 mL via ORAL

## 2016-12-13 MED ORDER — HYDROCOD POLST-CPM POLST ER 10-8 MG/5ML PO SUER: 5.0000 mL | Freq: Two times a day (BID) | ORAL | 0 refills | Status: DC | PRN

## 2016-12-13 MED ORDER — HYDROCOD POLST-CPM POLST ER 10-8 MG/5ML PO SUER
ORAL | Status: AC
  Filled 2016-12-13: qty 5

## 2016-12-13 NOTE — ED Notes (Addendum)
Pt. Verbalizes understanding of d/c instructions, prescriptions, and follow-up. VS stable.Pt. In NAD at time of d/c and denies further concerns regarding this visit. Pt. Stable at the time of departure from the unit, departing unit by the safest and most appropriate manner per that pt condition and limitations. Pt advised to return to the ED at any time for emergent concerns, or for new/worsening symptoms.   

## 2016-12-13 NOTE — ED Provider Notes (Signed)
[facility] Emergency Department Provider Note   First MD Initiated Contact with Patient 12/12/16 2343     (approximate)  I have reviewed the triage vital signs and the nursing notes.   HISTORY  Chief Complaint Cough    HPI Melanie Whitaker is a 31 y.o. female presents with 4 day history of cough congestion and fever. 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 Patient denies any vomiting or diarrhea. Patient admits to chest discomfort with coughing.   Past medical history Appendicitis Cholelithiasis There are no active problems to display for this patient.   History reviewed. No pertinent surgical history.  Prior to Admission medications   Not on File    Allergies Sulfa antibiotics and Phenergan [promethazine hcl]  History reviewed. No pertinent family history.  Social History Social History  Substance Use Topics  . Smoking status: Never Smoker  . Smokeless tobacco: Never Used  . Alcohol use No    Review of Systems Constitutional: Positive for fever/chills Eyes: No visual changes. ENT: No sore throat. Cardiovascular: Denies chest pain. Respiratory: Denies shortness of breath.Positive for cough Gastrointestinal: No abdominal pain.  No nausea, no vomiting.  No diarrhea.  No constipation. Genitourinary: Negative for dysuria. Musculoskeletal: Negative for back pain. Skin: Negative for rash. Neurological: Negative for headaches, focal weakness or numbness.  10-point ROS otherwise negative.  ____________________________________________   PHYSICAL EXAM:  VITAL SIGNS: ED Triage Vitals [12/12/16 2243]  Enc Vitals Group     BP 135/83     Pulse Rate (!) 102     Resp 18     Temp 98.7 F (37.1 C)     Temp Source Oral     SpO2 99 %     Weight 190 lb (86.2 kg)     Height 5\' 10"  (1.778 m)     Head Circumference      Peak Flow      Pain Score 10     Pain Loc      Pain Edu?      Excl. in [place]?     Constitutional:  Alert and oriented. Well appearing and in no acute distress. Eyes: Conjunctivae are normal. PERRL. EOMI. Head: Atraumatic. Ears:  Healthy appearing ear canals and TMs bilaterally Nose: No congestion/rhinnorhea. Mouth/Throat: Mucous membranes are moist. Oropharynx non-erythematous. Neck: No stridor.  No meningeal signs.  No cervical spine tenderness to palpation. Cardiovascular: Normal rate, regular rhythm. Good peripheral circulation. Grossly normal heart sounds. Respiratory: Normal respiratory effort.  No retractions. Lungs CTAB. Gastrointestinal: Soft and nontender. No distention.  Musculoskeletal: No lower extremity tenderness nor edema. No gross deformities of extremities. Neurologic:  Normal speech and language. No gross focal neurologic deficits are appreciated.  Skin:  Skin is warm, dry and intact. No rash noted. Psychiatric: Mood and affect are normal. Speech and behavior are normal.    RADIOLOGY I, [place], personally viewed and evaluated these images (plain radiographs) as part of my medical decision making, as well as reviewing the written report by the radiologist.  Dg Chest 2 View  Result Date: 12/12/2016 CLINICAL DATA:  Acute onset of productive cough and sore throat. Shortness of breath. Upper back pain and rib pain. Initial encounter. EXAM: CHEST  2 VIEW COMPARISON:  Chest radiograph performed 05/31/2010 FINDINGS: The lungs are well-aerated and clear. There is no evidence of focal opacification, pleural effusion or pneumothorax. The heart is normal in size; the mediastinal contour is within normal limits. No acute osseous  abnormalities are seen. Clips are noted within the right upper quadrant, reflecting prior cholecystectomy. IMPRESSION: No acute cardiopulmonary process seen. Electronically Signed   By: Garald Balding M.D.   On: 12/12/2016 23:09     Procedures    INITIAL IMPRESSION / ASSESSMENT AND PLAN / ED COURSE  Pertinent labs & imaging results that were  available during my care of the patient were reviewed by me and considered in my medical decision making (see chart for details).        ____________________________________________  FINAL CLINICAL IMPRESSION(S) / ED DIAGNOSES  Final diagnoses:  Influenza     MEDICATIONS GIVEN DURING THIS VISIT:  Medications  chlorpheniramine-HYDROcodone (TUSSIONEX) 10-8 MG/5ML suspension 5 mL (not administered)     NEW OUTPATIENT MEDICATIONS STARTED DURING THIS VISIT:  New Prescriptions   No medications on file    Modified Medications   No medications on file    Discontinued Medications   No medications on file     Note:  This document was prepared using Dragon voice recognition software and may include unintentional dictation errors.    Gregor Hams, MD 12/13/16 [phone]

## 2017-05-07 ENCOUNTER — Emergency Department
Admission: EM | Admit: 2017-05-07 | Discharge: 2017-05-07 | Disposition: A | Discharge status: Home / Self Care | Payer: Self-pay | Attending: Emergency Medicine | Admitting: Emergency Medicine

## 2017-05-07 ENCOUNTER — Encounter: Payer: Self-pay | Admitting: Emergency Medicine

## 2017-05-07 DIAGNOSIS — L237 Allergic contact dermatitis due to plants, except food: Secondary | ICD-10-CM | POA: Insufficient documentation

## 2017-05-07 MED ORDER — TRIAMCINOLONE ACETONIDE 40 MG/ML IJ SUSP
40.0000 mg | Freq: Once | INTRAMUSCULAR | Status: AC
  Administered 2017-05-07: 40 mg via INTRAMUSCULAR
  Filled 2017-05-07: qty 1

## 2017-05-07 MED ORDER — TRIAMCINOLONE ACETONIDE 0.1 % EX CREA: 1.0000 "application " | TOPICAL_CREAM | Freq: Four times a day (QID) | CUTANEOUS | 0 refills | Status: DC

## 2017-05-07 NOTE — ED Triage Notes (Signed)
Pt reports getting into poison ivy.  Rash to left arm and left leg. Itching in nature. No respiratory distress.Marland Kitchen

## 2017-05-07 NOTE — ED Provider Notes (Signed)
[facility] Emergency Department Provider Note  ____________________________________________  Time seen: Approximately 6:08 PM  I have reviewed the triage vital signs and the nursing notes.   HISTORY  Chief Complaint Rash    HPI Melanie Whitaker is a 31 y.o. female who presents emergency department for complaint of poison oak. Per the patient, she and her daughter were outside at her mother's place when they came into contact with poison oak. At that time, they were unsure of the plant became to contact with. Mother broke into a pruritic rash to the left forearm, left thigh. Patient has tried Benadryl, calamine lotion with minimal relief. No other complaints at this time.   History reviewed. No pertinent past medical history.  There are no active problems to display for this patient.   History reviewed. No pertinent surgical history.  Prior to Admission medications   Medication Sig Start Date End Date Taking? Authorizing Provider  chlorpheniramine-HYDROcodone (TUSSIONEX PENNKINETIC ER) 10-8 MG/5ML SUER Take 5 mLs by mouth every 12 (twelve) hours as needed. 12/13/16   Gregor Hams, MD    Allergies Sulfa antibiotics and Phenergan [promethazine hcl]  History reviewed. No pertinent family history.  Social History Social History  Substance Use Topics  . Smoking status: Never Smoker  . Smokeless tobacco: Never Used  . Alcohol use No     Review of Systems  Constitutional: No fever/chills Eyes: No visual changes. No discharge ENT: No upper respiratory complaints. Cardiovascular: no chest pain. Respiratory: no cough. No SOB. Gastrointestinal: No abdominal pain.  No nausea, no vomiting.   Musculoskeletal: Negative for musculoskeletal pain. Skin: Positive for pruritic rash to the left forearm and left thigh. Neurological: Negative for headaches, focal weakness or numbness. 10-point ROS otherwise  negative.  ____________________________________________   PHYSICAL EXAM:  VITAL SIGNS: ED Triage Vitals  Enc Vitals Group     BP 05/07/17 1738 118/86     Pulse Rate 05/07/17 1738 78     Resp 05/07/17 1738 18     Temp 05/07/17 1738 98.7 F (37.1 C)     Temp Source 05/07/17 1738 Oral     SpO2 05/07/17 1738 98 %     Weight 05/07/17 1739 190 lb (86.2 kg)     Height 05/07/17 1739 5\' 10"  (1.778 m)     Head Circumference --      Peak Flow --      Pain Score --      Pain Loc --      Pain Edu? --      Excl. in [place]? --      Constitutional: Alert and oriented. Well appearing and in no acute distress. Eyes: Conjunctivae are normal. PERRL. EOMI. Head: Atraumatic. Neck: No stridor.    Cardiovascular: Normal rate, regular rhythm. Normal S1 and S2.  Good peripheral circulation. Respiratory: Normal respiratory effort without tachypnea or retractions. Lungs CTAB. Good air entry to the bases with no decreased or absent breath sounds. Musculoskeletal: Full range of motion to all extremities. No gross deformities appreciated. Neurologic:  Normal speech and language. No gross focal neurologic deficits are appreciated.  Skin:  Skin is warm, dry and intact. Erythematous vesicular rash noted to the left forearm and left medial thigh. Excoriations from scratching. No pustular drainage. No surrounding cellulitis. Rash is consistent with poison oak/ivy Psychiatric: Mood and affect are normal. Speech and behavior are normal. Patient exhibits appropriate insight and judgement.   ____________________________________________   LABS (all labs ordered are listed, but only abnormal  results are displayed)  Labs Reviewed - No data to display ____________________________________________  EKG   ____________________________________________  RADIOLOGY   No results found.  ____________________________________________    PROCEDURES  Procedure(s) performed:    Procedures    Medications   triamcinolone acetonide (KENALOG-40) injection 40 mg (not administered)     ____________________________________________   INITIAL IMPRESSION / ASSESSMENT AND PLAN / ED COURSE  Pertinent labs & imaging results that were available during my care of the patient were reviewed by me and considered in my medical decision making (see chart for details).  Review of the [place] CSRS was performed in accordance of the [place] prior to dispensing any controlled drugs.     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 Patient is given a shot of Kenalog emergency department. Triamcinolone ointment is prescribed for at home use. Patient follow-up with primary care as needed..  Patient is given ED precautions to return to the ED for any worsening or new symptoms.     ____________________________________________  FINAL CLINICAL IMPRESSION(S) / ED DIAGNOSES  Final diagnoses:  None      NEW MEDICATIONS STARTED DURING THIS VISIT:  New Prescriptions   No medications on file        This chart was dictated using voice recognition software/Dragon. Despite best efforts to proofread, errors can occur which can change the meaning. Any change was purely unintentional.    Darletta Moll, PA-C 05/07/17 1840    Darel Hong, MD 05/07/17 [phone]

## 2017-11-02 ENCOUNTER — Emergency Department
Admission: EM | Admit: 2017-11-02 | Discharge: 2017-11-02 | Disposition: A | Discharge status: Home / Self Care | Payer: Self-pay | Attending: Emergency Medicine | Admitting: Emergency Medicine

## 2017-11-02 ENCOUNTER — Encounter: Payer: Self-pay | Admitting: Emergency Medicine

## 2017-11-02 ENCOUNTER — Other Ambulatory Visit: Payer: Self-pay

## 2017-11-02 DIAGNOSIS — Y9389 Activity, other specified: Secondary | ICD-10-CM | POA: Insufficient documentation

## 2017-11-02 DIAGNOSIS — Y998 Other external cause status: Secondary | ICD-10-CM | POA: Insufficient documentation

## 2017-11-02 DIAGNOSIS — T162XXA Foreign body in left ear, initial encounter: Secondary | ICD-10-CM | POA: Insufficient documentation

## 2017-11-02 DIAGNOSIS — Y929 Unspecified place or not applicable: Secondary | ICD-10-CM | POA: Insufficient documentation

## 2017-11-02 DIAGNOSIS — X58XXXA Exposure to other specified factors, initial encounter: Secondary | ICD-10-CM | POA: Insufficient documentation

## 2017-11-02 NOTE — ED Provider Notes (Signed)
[facility] Emergency Department Provider Note  ____________________________________________   First MD Initiated Contact with Patient 11/02/17 1234     (approximate)  I have reviewed the triage vital signs and the nursing notes.   HISTORY  Chief Complaint Foreign Body in Ear    HPI Melanie Whitaker is a 32 y.o. female complains of a Q-tip being in the left ear, she states she was cleaning her ears last night and the Q-tip got stuck, she states it does become painful overnight, she did try to use water to get it out without any relief, she denies any other problems  History reviewed. No pertinent past medical history.  There are no active problems to display for this patient.   History reviewed. No pertinent surgical history.  Prior to Admission medications   Medication Sig Start Date End Date Taking? Authorizing Provider  chlorpheniramine-HYDROcodone (TUSSIONEX PENNKINETIC ER) 10-8 MG/5ML SUER Take 5 mLs by mouth every 12 (twelve) hours as needed. 12/13/16   Gregor Hams, MD  triamcinolone cream (KENALOG) 0.1 % Apply 1 application topically 4 (four) times daily. 05/07/17   Cuthriell, Charline Bills, PA-C    Allergies Sulfa antibiotics and Phenergan [promethazine hcl]  No family history on file.  Social History Social History   Tobacco Use  . Smoking status: Never Smoker  . Smokeless tobacco: Never Used  Substance Use Topics  . Alcohol use: No  . Drug use: Not on file    Review of Systems  Constitutional: No fever/chills Eyes: No visual changes. ENT: No sore throat.  Positive for foreign body in the left ear Respiratory: Denies cough Genitourinary: Negative for dysuria. Musculoskeletal: Negative for back pain. Skin: Negative for rash.    ____________________________________________   PHYSICAL EXAM:  VITAL SIGNS: ED Triage Vitals  Enc Vitals Group     BP 11/02/17 1221 131/83     Pulse Rate 11/02/17 1221 82     Resp 11/02/17  1221 16     Temp 11/02/17 1221 98 F (36.7 C)     Temp Source 11/02/17 1221 Oral     SpO2 11/02/17 1221 99 %     Weight 11/02/17 1210 190 lb (86.2 kg)     Height 11/02/17 1210 5\' 8"  (1.727 m)     Head Circumference --      Peak Flow --      Pain Score 11/02/17 1210 6     Pain Loc --      Pain Edu? --      Excl. in [place]? --     Constitutional: Alert and oriented. Well appearing and in no acute distress. Eyes: Conjunctivae are normal.  EARS: Left ear has a white cotton tip in the ear canal, there is no blood or drainage noted Head: Atraumatic. Nose: No congestion/rhinnorhea. Mouth/Throat: Mucous membranes are moist.   Cardiovascular: Normal rate, regular rhythm. Respiratory: Normal respiratory effort.  No retractions GU: deferred Musculoskeletal: FROM all extremities, warm and well perfused Neurologic:  Normal speech and language.  Skin:  Skin is warm, dry and intact. No rash noted. Psychiatric: Mood and affect are normal. Speech and behavior are normal.  ____________________________________________   LABS (all labs ordered are listed, but only abnormal results are displayed)  Labs Reviewed - No data to display ____________________________________________   ____________________________________________  RADIOLOGY    ____________________________________________   PROCEDURES  Procedure(s) performed:   .Foreign Body Removal Date/Time: 11/02/2017 12:55 PM Performed by: Versie Starks, PA-C Authorized by: Versie Starks,  PA-C  Consent: Verbal consent obtained. Risks and benefits: risks, benefits and alternatives were discussed Consent given by: patient Patient understanding: patient states understanding of the procedure being performed Patient consent: the patient's understanding of the procedure matches consent given Procedure consent: procedure consent matches procedure scheduled Site marked: the operative site was marked Patient identity confirmed: verbally  with patient and arm band Time out: Immediately prior to procedure a "time out" was called to verify the correct patient, procedure, equipment, support staff and site/side marked as required. Body area: ear Location details: left ear Localization method: visualized Removal mechanism: alligator forceps Complexity: simple 1 objects recovered. Objects recovered: cotton head of q tip Post-procedure assessment: foreign body removed Patient tolerance: Patient tolerated the procedure well with no immediate complications      ____________________________________________   INITIAL IMPRESSION / ASSESSMENT AND PLAN / ED COURSE  Pertinent labs & imaging results that were available during my care of the patient were reviewed by me and considered in my medical decision making (see chart for details).  Patient is 33 year old female complaining of a Q-tip being in the left ear, on physical exam there is white cotton tip in the left ear, the cotton tip was removed with alligator forceps, patient tolerated procedure well, on recheck of the ear canal there is no damage, there is no bleeding or irritation in the canal  Patient was instructed to follow-up with her doctor if she has any problems, she is not to put Q-tips or any other objects in her ears, patient states she understands comply with instructions, she was discharged in stable condition     As part of my medical decision making, I reviewed the following data within the [place] chart reviewed, Notes from prior ED visits  ____________________________________________   FINAL CLINICAL IMPRESSION(S) / ED DIAGNOSES  Final diagnoses:  Foreign body of left ear, initial encounter      NEW MEDICATIONS STARTED DURING THIS VISIT:  New Prescriptions   No medications on file     Note:  This document was prepared using Dragon voice recognition software and may include unintentional dictation errors.    Versie Starks, PA-C 11/02/17 1258    Nena Polio, MD 11/02/17 1316

## 2017-11-02 NOTE — Discharge Instructions (Signed)
The Q-tip was removed from the ear, if you have any problems please follow-up with your regular doctor or return to the emergency department

## 2017-11-02 NOTE — ED Notes (Signed)

## 2017-11-02 NOTE — ED Triage Notes (Signed)
Arrives with c/o Q-Tip tip stuck in left ear.

## 2018-01-06 ENCOUNTER — Emergency Department: Payer: Self-pay

## 2018-01-06 ENCOUNTER — Other Ambulatory Visit: Payer: Self-pay

## 2018-01-06 ENCOUNTER — Emergency Department
Admission: EM | Admit: 2018-01-06 | Discharge: 2018-01-06 | Disposition: A | Discharge status: Home / Self Care | Payer: Self-pay | Attending: Student in an Organized Health Care Education/Training Program | Admitting: Student in an Organized Health Care Education/Training Program

## 2018-01-06 DIAGNOSIS — J181 Lobar pneumonia, unspecified organism: Secondary | ICD-10-CM | POA: Insufficient documentation

## 2018-01-06 DIAGNOSIS — Z79899 Other long term (current) drug therapy: Secondary | ICD-10-CM | POA: Insufficient documentation

## 2018-01-06 DIAGNOSIS — J189 Pneumonia, unspecified organism: Secondary | ICD-10-CM

## 2018-01-06 LAB — INFLUENZA PANEL BY PCR (TYPE A & B)
Influenza A By PCR: NEGATIVE
Influenza B By PCR: NEGATIVE

## 2018-01-06 MED ORDER — HYDROCOD POLST-CPM POLST ER 10-8 MG/5ML PO SUER: 5.0000 mL | Freq: Every evening | ORAL | 0 refills | Status: DC | PRN

## 2018-01-06 MED ORDER — AZITHROMYCIN 250 MG PO TABS: ORAL_TABLET | ORAL | 0 refills | Status: AC

## 2018-01-06 MED ORDER — IBUPROFEN 600 MG PO TABS: 600.0000 mg | ORAL_TABLET | Freq: Three times a day (TID) | ORAL | 0 refills | Status: DC | PRN

## 2018-01-06 NOTE — ED Notes (Signed)
Mask placed in triage.

## 2018-01-06 NOTE — ED Provider Notes (Addendum)
[facility] Emergency Department Provider Note   ____________________________________________   First MD Initiated Contact with Patient 01/06/18 1704     (approximate)  I have reviewed the triage vital signs and the nursing notes.   HISTORY  Chief Complaint Cough    HPI Melanie Whitaker is a 32 y.o. female patient complain of cough, nasal chest congestion, fever, body aches, and sore throat.  Patient denies vomiting, nausea, or diarrhea.  Patient states no relief over-the-counter cough preparations, Tylenol and ibuprofen.  Patient is a body aches secondary to coughing spells.  Patient is not taking flu shot for this season.  Patient rates her pain discomfort as a 7/10.  Patient described the pain as "generalized aching/tightness in chest".  Patient denies tobacco use.  History reviewed. No pertinent past medical history.  There are no active problems to display for this patient.   Past Surgical History:  Procedure Laterality Date  . APPENDECTOMY    . CHOLECYSTECTOMY    . TONSILLECTOMY    . TUBAL LIGATION      Prior to Admission medications   Medication Sig Start Date End Date Taking? Authorizing Provider  azithromycin (ZITHROMAX Z-PAK) 250 MG tablet Take 2 tablets (500 mg) on  Day 1,  followed by 1 tablet (250 mg) once daily on Days 2 through 5. 01/06/18 01/11/18  Sable Feil, PA-C  chlorpheniramine-HYDROcodone (TUSSIONEX PENNKINETIC ER) 10-8 MG/5ML SUER Take 5 mLs by mouth every 12 (twelve) hours as needed. 12/13/16   Gregor Hams, MD  chlorpheniramine-HYDROcodone [facility] PENNKINETIC ER) 10-8 MG/5ML SUER Take 5 mLs by mouth at bedtime as needed for cough. 01/06/18   Sable Feil, PA-C  ibuprofen (ADVIL,MOTRIN) 600 MG tablet Take 1 tablet (600 mg total) by mouth every 8 (eight) hours as needed. 01/06/18   Sable Feil, PA-C  triamcinolone cream (KENALOG) 0.1 % Apply 1 application topically 4 (four) times daily. 05/07/17   Cuthriell, Charline Bills, PA-C    Allergies Sulfa antibiotics; Penicillins; and Phenergan [promethazine hcl]  History reviewed. No pertinent family history.  Social History Social History   Tobacco Use  . Smoking status: Never Smoker  . Smokeless tobacco: Never Used  Substance Use Topics  . Alcohol use: No  . Drug use: Not on file    Review of Systems Constitutional: No fever/chills Eyes: No visual changes. ENT: No sore throat. Cardiovascular: Denies chest pain. Respiratory: Denies shortness of breath. Gastrointestinal: No abdominal pain.  No nausea, no vomiting.  No diarrhea.  No constipation. Genitourinary: Negative for dysuria. Musculoskeletal: Negative for back pain. Skin: Negative for rash. Neurological: Negative for headaches, focal weakness or numbness. Allergic/Immunilogical: Sulfa, penicillin, and Phenergan. ____________________________________________   PHYSICAL EXAM:  VITAL SIGNS: ED Triage Vitals  Enc Vitals Group     BP 01/06/18 1641 137/77     Pulse Rate 01/06/18 1641 (!) 106     Resp 01/06/18 1641 20     Temp 01/06/18 1641 98.6 F (37 C)     Temp Source 01/06/18 1641 Oral     SpO2 01/06/18 1641 98 %     Weight 01/06/18 1642 200 lb (90.7 kg)     Height 01/06/18 1642 5\' 10"  (1.778 m)     Head Circumference --      Peak Flow --      Pain Score 01/06/18 1641 7     Pain Loc --      Pain Edu? --      Excl. in [place]? --  Constitutional: Alert and oriented. Well appearing and in no acute distress. Nose: Edematous nasal turbinates with thick rhinorrhea. Mouth/Throat: Mucous membranes are moist.  Oropharynx non-erythematous.  Postnasal drainage and decreased voice volume. Neck: No stridor. Hematological/Lymphatic/Immunilogical: No cervical lymphadenopathy. Cardiovascular: Normal rate, regular rhythm. Grossly normal heart sounds.  Good peripheral circulation. Respiratory: Normal respiratory effort.  No retractions. Lungs CTAB. Skin:  Skin is warm, dry and intact. No rash  noted. ____________________________________________   LABS (all labs ordered are listed, but only abnormal results are displayed)  Labs Reviewed  INFLUENZA PANEL BY PCR (TYPE A & B)   ____________________________________________  EKG EKG read by Dr. Quentin Cornwall showing sinus tachycardia,otherwise normal EKG.  ____________________________________________  RADIOLOGY  Left lung infiltrate.  No other acute findings.  Official radiology report(s): Dg Chest 2 View  Result Date: 01/06/2018 CLINICAL DATA:  Cough, congestion, fever, body aches. EXAM: CHEST - 2 VIEW COMPARISON:  12/12/2016. FINDINGS: Mediastinum and hilar structures are normal. Heart size normal. Minimal infiltrate left mid lung field. No pleural effusion or pneumothorax. No acute bony abnormality. IMPRESSION: Minimal infiltrate left mid lung field suggesting mild pneumonia. Electronically Signed   By: Marcello Moores  Register   On: 01/06/2018 17:06    ____________________________________________   PROCEDURES  Procedure(s) performed: None  Procedures  Critical Care performed: No  ____________________________________________   INITIAL IMPRESSION / ASSESSMENT AND PLAN / ED COURSE  As part of my medical decision making, I reviewed the following data within the [place]   Patient presents 1 week of body aches, nonproductive cough and chest congestion.  Discussed chest x-ray findings consistent with left upper lobe pneumonia.  Patient given discharge care instructions and a work note.  Patient advised take medication as directed.  Patient [facility] no [facility] in 3-5 days.      ____________________________________________   FINAL CLINICAL IMPRESSION(S) / ED DIAGNOSES  Final diagnoses:  Community acquired pneumonia of left upper lobe of lung [facility])     ED Discharge Orders        Ordered    azithromycin (ZITHROMAX Z-PAK) 250 MG tablet     01/06/18 1724     chlorpheniramine-HYDROcodone (TUSSIONEX PENNKINETIC ER) 10-8 MG/5ML SUER  At bedtime PRN     01/06/18 1724    ibuprofen (ADVIL,MOTRIN) 600 MG tablet  Every 8 hours PRN     01/06/18 1724       Note:  This document was prepared using Dragon voice recognition software and may include unintentional dictation errors.    Sable Feil, PA-C 01/06/18 1727    Sable Feil, PA-C 01/06/18 1728    Merlyn Lot, MD 01/06/18 [phone]

## 2018-01-06 NOTE — ED Triage Notes (Signed)
Pt states last week started with cough, congestion, fever, body aches in chest and back. Pt has been taking OTC medicine. Tylenol and ibuprofen and cough medicine as well. Pt coughing in triage, weak cough. Alert, oriented, ambulatory. States muscle aches and tightness from coughing so much.

## 2018-03-29 ENCOUNTER — Emergency Department
Admission: EM | Admit: 2018-03-29 | Discharge: 2018-03-29 | Disposition: A | Discharge status: Home / Self Care | Payer: Self-pay | Attending: Emergency Medicine | Admitting: Emergency Medicine

## 2018-03-29 ENCOUNTER — Emergency Department: Payer: Self-pay

## 2018-03-29 ENCOUNTER — Other Ambulatory Visit: Payer: Self-pay

## 2018-03-29 DIAGNOSIS — R091 Pleurisy: Secondary | ICD-10-CM

## 2018-03-29 DIAGNOSIS — B349 Viral infection, unspecified: Secondary | ICD-10-CM | POA: Insufficient documentation

## 2018-03-29 DIAGNOSIS — Z79899 Other long term (current) drug therapy: Secondary | ICD-10-CM | POA: Insufficient documentation

## 2018-03-29 LAB — BASIC METABOLIC PANEL
Anion gap: 9 (ref 5–15)
BUN: 13 mg/dL (ref 6–20)
CALCIUM: 9.2 mg/dL (ref 8.9–10.3)
CO2: 23 mmol/L (ref 22–32)
CREATININE: 0.79 mg/dL (ref 0.44–1.00)
Chloride: 107 mmol/L (ref 101–111)
GFR calc non Af Amer: >60 mL/min (ref >60)
Glucose, Bld: 112 mg/dL — ABNORMAL HIGH (ref 65–99)
Potassium: 3.8 mmol/L (ref 3.5–5.1)
SODIUM: 139 mmol/L (ref 135–145)

## 2018-03-29 LAB — CBC
HCT: 41 % (ref 35.0–47.0)
Hemoglobin: 13.8 g/dL (ref 12.0–16.0)
MCH: 29.1 pg (ref 26.0–34.0)
MCHC: 33.6 g/dL (ref 32.0–36.0)
MCV: 86.8 fL (ref 80.0–100.0)
PLATELETS: 245 10*3/uL (ref 150–440)
RBC: 4.72 MIL/uL (ref 3.80–5.20)
RDW: 14.3 % (ref 11.5–14.5)
WBC: 9.2 10*3/uL (ref 3.6–11.0)

## 2018-03-29 LAB — TROPONIN I: Troponin I: <0.03 ng/mL (ref <0.03)

## 2018-03-29 MED ORDER — SODIUM CHLORIDE 0.9 % IV BOLUS
1000.0000 mL | Freq: Once | INTRAVENOUS | Status: AC
  Administered 2018-03-29: 1000 mL via INTRAVENOUS

## 2018-03-29 MED ORDER — ALBUTEROL SULFATE HFA 108 (90 BASE) MCG/ACT IN AERS: 2.0000 | INHALATION_SPRAY | Freq: Four times a day (QID) | RESPIRATORY_TRACT | 0 refills | Status: DC | PRN

## 2018-03-29 MED ORDER — KETOROLAC TROMETHAMINE 30 MG/ML IJ SOLN
30.0000 mg | Freq: Once | INTRAMUSCULAR | Status: AC
  Administered 2018-03-29: 30 mg via INTRAVENOUS
  Filled 2018-03-29: qty 1

## 2018-03-29 MED ORDER — ALBUTEROL SULFATE (2.5 MG/3ML) 0.083% IN NEBU
2.5000 mg | INHALATION_SOLUTION | Freq: Once | RESPIRATORY_TRACT | Status: AC
  Administered 2018-03-29: 2.5 mg via RESPIRATORY_TRACT
  Filled 2018-03-29: qty 3

## 2018-03-29 NOTE — ED Triage Notes (Signed)
First Nurse NOte:  C/O right pleuritic pain since Tuesday.  States had similar symptoms 2 months ago and was diagnosed with pneumonia.  States has had a cough x 1 week.    AAOx3.  Skin warm and dry. No SOB/ DOE.  NAD

## 2018-03-29 NOTE — ED Provider Notes (Signed)
[facility] Emergency Department Provider Note  ___________________________________________   First MD Initiated Contact with Patient 03/29/18 1717     (approximate)  I have reviewed the triage vital signs and the nursing notes.   HISTORY  Chief Complaint Chest Pain and Cough  HPI Melanie Whitaker is a 32 y.o. female with a history of pneumonia in March of this year treated as an outpatient was presenting to the emergency department with 1 week of cough, productive sputum as well as bilateral lower chest pain.  She is not reporting fever.  Says that she has had a cough over the past 2 weeks and then started having the chest pain just 1 day ago.  Says that the pain is sharp and worsened with coughing and deep breathing.  She also reports sore throat but no runny nose or pressure in her ears.  Says that she is having one episode of diarrhea as well over the past week with this illness.  Denies any known sick contacts.  Has tried cough medicine at home without relief.  Michela Pitcher that this feels similar to when she had pneumonia 2 months ago.  Says that her symptoms did resolve after treatment for the pneumonia.  Does not report any radiation of the pain.  Says that she is nauseous but has not vomited.  History reviewed. No pertinent past medical history.  There are no active problems to display for this patient.   Past Surgical History:  Procedure Laterality Date  . APPENDECTOMY    . CHOLECYSTECTOMY    . TONSILLECTOMY    . TUBAL LIGATION      Prior to Admission medications   Medication Sig Start Date End Date Taking? Authorizing Provider  chlorpheniramine-HYDROcodone (TUSSIONEX PENNKINETIC ER) 10-8 MG/5ML SUER Take 5 mLs by mouth every 12 (twelve) hours as needed. 12/13/16   Gregor Hams, MD  chlorpheniramine-HYDROcodone [facility] PENNKINETIC ER) 10-8 MG/5ML SUER Take 5 mLs by mouth at bedtime as needed for cough. 01/06/18   Sable Feil, PA-C  ibuprofen  (ADVIL,MOTRIN) 600 MG tablet Take 1 tablet (600 mg total) by mouth every 8 (eight) hours as needed. 01/06/18   Sable Feil, PA-C  triamcinolone cream (KENALOG) 0.1 % Apply 1 application topically 4 (four) times daily. 05/07/17   Cuthriell, Charline Bills, PA-C    Allergies Sulfa antibiotics; Penicillins; and Phenergan [promethazine hcl]  History reviewed. No pertinent family history.  Social History Social History   Tobacco Use  . Smoking status: Never Smoker  . Smokeless tobacco: Never Used  Substance Use Topics  . Alcohol use: No  . Drug use: Not on file    Review of Systems  Constitutional: No fever/chills Eyes: No visual changes. ENT: No sore throat. Cardiovascular: As above Respiratory: As above Gastrointestinal: No abdominal pain.  no vomiting.  No diarrhea.  No constipation. Genitourinary: Negative for dysuria. Musculoskeletal: Negative for back pain. Skin: Negative for rash. Neurological: Negative for headaches, focal weakness or numbness.   ____________________________________________   PHYSICAL EXAM:  VITAL SIGNS: ED Triage Vitals [03/29/18 1714]  Enc Vitals Group     BP 120/77     Pulse Rate (!) 109     Resp 18     Temp 97.6 F (36.4 C)     Temp Source Oral     SpO2 99 %     Weight 200 lb (90.7 kg)     Height 5\' 10"  (1.778 m)     Head Circumference  Peak Flow      Pain Score 6     Pain Loc      Pain Edu?      Excl. in [place]?     Constitutional: Alert and oriented. Well appearing and in no acute distress. Eyes: Conjunctivae are normal.  Head: Atraumatic. Nose: No congestion/rhinnorhea. Mouth/Throat: Mucous membranes are moist.  No erythema nor is there any swelling to the tonsils nor uvula. Neck: No stridor.   Cardiovascular: Normal rate, regular rhythm. Grossly normal heart sounds.   Chest pain strong reproducible to palpation to the anterior and lateral bilateral lower chest walls.  No crepitus.  No deformity palpated. Respiratory: Normal  respiratory effort.  No retractions. Lungs CTAB. Gastrointestinal: Soft and nontender. No distention. No CVA tenderness. Musculoskeletal: No lower extremity tenderness nor edema.  No joint effusions. Neurologic:  Normal speech and language. No gross focal neurologic deficits are appreciated. Skin:  Skin is warm, dry and intact. No rash noted. Psychiatric: Mood and affect are normal. Speech and behavior are normal.  ____________________________________________   LABS (all labs ordered are listed, but only abnormal results are displayed)  Labs Reviewed  BASIC METABOLIC PANEL - Abnormal; Notable for the following components:      Result Value   Glucose, Bld 112 (*)    All other components within normal limits  CBC  TROPONIN I  POC URINE PREG, ED   ____________________________________________  EKG  ED ECG REPORT I, Doran Stabler, the attending physician, personally viewed and interpreted this ECG.   Date: 03/29/2018  EKG Time: 1720  Rate: 98  Rhythm: normal sinus rhythm  Axis: Normal  Intervals:none  ST&T Change: No ST segment elevation or depression.  No abnormal T wave inversion.  ____________________________________________  RADIOLOGY  No acute process on the chest x-ray. ____________________________________________   PROCEDURES  Procedure(s) performed:   Procedures  Critical Care performed:   ____________________________________________   INITIAL IMPRESSION / ASSESSMENT AND PLAN / ED COURSE  Pertinent labs & imaging results that were available during my care of the patient were reviewed by me and considered in my medical decision making (see chart for details).  Differential diagnosis includes, but is not limited to, ACS, aortic dissection, pulmonary embolism, cardiac tamponade, pneumothorax, pneumonia, pericarditis, myocarditis, GI-related causes including esophagitis/gastritis, and musculoskeletal chest wall pain.   As part of my medical decision  making, I reviewed the following data within the electronic MEDICAL RECORD NUMBER Notes from prior ED visits  ----------------------------------------- 7:17 PM on 03/29/2018 -----------------------------------------  Patient at this time says that her symptoms are relieved after Toradol and albuterol.  Patient's heart rate is 77.  Normal pulse ox.  Patient likely with chest wall pain/pleurisy from a viral syndrome.  Do not suspect PE.  Does not clinically fit with patient's diarrhea and also cough followed by chest pain.  Much more likely to be viral syndrome/benign cause.  Initially tachycardic but now heart rate of 77 and relieved with symptomatic treatment.  Will be discharged with an inhaler.  Patient will take ibuprofen at home for pain control.  She is understanding of the diagnosis as well as treatment plan willing to comply. ____________________________________________   FINAL CLINICAL IMPRESSION(S) / ED DIAGNOSES  Viral syndrome.  Pleurisy.    NEW MEDICATIONS STARTED DURING THIS VISIT:  New Prescriptions   No medications on file     Note:  This document was prepared using Dragon voice recognition software and may include unintentional dictation errors.     Kelley Knoth, Randall An,  MD 03/29/18 1918

## 2018-03-29 NOTE — ED Triage Notes (Signed)
Pt arrives to ED c/o of symptoms similar to when she had pneumonia last (about 2 months ago). States cough x 2 weeks. States Tuesday started with rib pain on both sides. States productive cough. Alert, oriented, ambulatory. Afebrile in triage.

## 2018-12-08 ENCOUNTER — Inpatient Hospital Stay
Admission: EM | Admit: 2018-12-08 | Discharge: 2018-12-09 | DRG: 193 | Disposition: A | Discharge status: Home / Self Care | Payer: Self-pay | Attending: Internal Medicine | Admitting: Internal Medicine

## 2018-12-08 ENCOUNTER — Emergency Department: Payer: Self-pay

## 2018-12-08 ENCOUNTER — Other Ambulatory Visit: Payer: Self-pay

## 2018-12-08 DIAGNOSIS — R0602 Shortness of breath: Secondary | ICD-10-CM

## 2018-12-08 DIAGNOSIS — Z882 Allergy status to sulfonamides status: Secondary | ICD-10-CM

## 2018-12-08 DIAGNOSIS — J9601 Acute respiratory failure with hypoxia: Secondary | ICD-10-CM | POA: Diagnosis present

## 2018-12-08 DIAGNOSIS — J101 Influenza due to other identified influenza virus with other respiratory manifestations: Principal | ICD-10-CM | POA: Diagnosis present

## 2018-12-08 DIAGNOSIS — F329 Major depressive disorder, single episode, unspecified: Secondary | ICD-10-CM | POA: Diagnosis present

## 2018-12-08 DIAGNOSIS — Z880 Allergy status to penicillin: Secondary | ICD-10-CM

## 2018-12-08 DIAGNOSIS — R42 Dizziness and giddiness: Secondary | ICD-10-CM | POA: Diagnosis present

## 2018-12-08 DIAGNOSIS — Z79899 Other long term (current) drug therapy: Secondary | ICD-10-CM

## 2018-12-08 DIAGNOSIS — J209 Acute bronchitis, unspecified: Secondary | ICD-10-CM | POA: Diagnosis present

## 2018-12-08 DIAGNOSIS — Z888 Allergy status to other drugs, medicaments and biological substances status: Secondary | ICD-10-CM

## 2018-12-08 LAB — BASIC METABOLIC PANEL
Anion gap: 10 (ref 5–15)
BUN: 13 mg/dL (ref 6–20)
CHLORIDE: 104 mmol/L (ref 98–111)
CO2: 23 mmol/L (ref 22–32)
Calcium: 9.4 mg/dL (ref 8.9–10.3)
Creatinine, Ser: 0.75 mg/dL (ref 0.44–1.00)
GFR calc Af Amer: >60 mL/min (ref >60)
GFR calc non Af Amer: >60 mL/min (ref >60)
Glucose, Bld: 130 mg/dL — ABNORMAL HIGH (ref 70–99)
POTASSIUM: 3.5 mmol/L (ref 3.5–5.1)
Sodium: 137 mmol/L (ref 135–145)

## 2018-12-08 LAB — INFLUENZA PANEL BY PCR (TYPE A & B)
Influenza A By PCR: POSITIVE — AB
Influenza B By PCR: NEGATIVE

## 2018-12-08 LAB — CBC
HEMATOCRIT: 40.7 % (ref 36.0–46.0)
Hemoglobin: 13.3 g/dL (ref 12.0–15.0)
MCH: 28.6 pg (ref 26.0–34.0)
MCHC: 32.7 g/dL (ref 30.0–36.0)
MCV: 87.5 fL (ref 80.0–100.0)
Platelets: 278 10*3/uL (ref 150–400)
RBC: 4.65 MIL/uL (ref 3.87–5.11)
RDW: 13.7 % (ref 11.5–15.5)
WBC: 6.3 10*3/uL (ref 4.0–10.5)
nRBC: 0 % (ref 0.0–0.2)

## 2018-12-08 MED ORDER — IPRATROPIUM-ALBUTEROL 0.5-2.5 (3) MG/3ML IN SOLN
3.0000 mL | Freq: Four times a day (QID) | RESPIRATORY_TRACT | Status: DC
  Administered 2018-12-09 (×2): 3 mL via RESPIRATORY_TRACT
  Filled 2018-12-08 (×2): qty 3

## 2018-12-08 MED ORDER — METHYLPREDNISOLONE SODIUM SUCC 125 MG IJ SOLR
60.0000 mg | Freq: Two times a day (BID) | INTRAMUSCULAR | Status: DC
  Administered 2018-12-08: 60 mg via INTRAVENOUS
  Filled 2018-12-08: qty 2

## 2018-12-08 MED ORDER — ALBUTEROL SULFATE (2.5 MG/3ML) 0.083% IN NEBU: 2.5000 mg | INHALATION_SOLUTION | RESPIRATORY_TRACT | Status: DC | PRN

## 2018-12-08 MED ORDER — ONDANSETRON HCL 4 MG/2ML IJ SOLN: 4.0000 mg | Freq: Four times a day (QID) | INTRAMUSCULAR | Status: DC | PRN

## 2018-12-08 MED ORDER — DEXAMETHASONE SODIUM PHOSPHATE 10 MG/ML IJ SOLN
10.0000 mg | Freq: Once | INTRAMUSCULAR | Status: AC
  Administered 2018-12-08: 10 mg via INTRAMUSCULAR
  Filled 2018-12-08: qty 1

## 2018-12-08 MED ORDER — DOCUSATE SODIUM 100 MG PO CAPS
100.0000 mg | ORAL_CAPSULE | Freq: Two times a day (BID) | ORAL | Status: DC
  Administered 2018-12-08 – 2018-12-09 (×2): 100 mg via ORAL
  Filled 2018-12-08 (×2): qty 1

## 2018-12-08 MED ORDER — ONDANSETRON HCL 4 MG PO TABS: 4.0000 mg | ORAL_TABLET | Freq: Four times a day (QID) | ORAL | Status: DC | PRN

## 2018-12-08 MED ORDER — OSELTAMIVIR PHOSPHATE 75 MG PO CAPS
75.0000 mg | ORAL_CAPSULE | Freq: Two times a day (BID) | ORAL | Status: DC
  Administered 2018-12-08 – 2018-12-09 (×2): 75 mg via ORAL
  Filled 2018-12-08 (×2): qty 1

## 2018-12-08 MED ORDER — ACETAMINOPHEN 325 MG PO TABS: 650.0000 mg | ORAL_TABLET | Freq: Four times a day (QID) | ORAL | Status: DC | PRN

## 2018-12-08 MED ORDER — IPRATROPIUM-ALBUTEROL 0.5-2.5 (3) MG/3ML IN SOLN
3.0000 mL | Freq: Once | RESPIRATORY_TRACT | Status: AC
  Administered 2018-12-08: 3 mL via RESPIRATORY_TRACT
  Filled 2018-12-08: qty 3

## 2018-12-08 MED ORDER — SODIUM CHLORIDE 0.9 % IV SOLN
INTRAVENOUS | Status: DC
  Administered 2018-12-08: 23:00:00 via INTRAVENOUS

## 2018-12-08 MED ORDER — ACETAMINOPHEN 650 MG RE SUPP: 650.0000 mg | Freq: Four times a day (QID) | RECTAL | Status: DC | PRN

## 2018-12-08 MED ORDER — IBUPROFEN 400 MG PO TABS: 400.0000 mg | ORAL_TABLET | Freq: Four times a day (QID) | ORAL | Status: DC | PRN

## 2018-12-08 MED ORDER — ENOXAPARIN SODIUM 40 MG/0.4ML ~~LOC~~ SOLN
40.0000 mg | SUBCUTANEOUS | Status: DC
  Administered 2018-12-08: 40 mg via SUBCUTANEOUS
  Filled 2018-12-08: qty 0.4

## 2018-12-08 MED ORDER — CITALOPRAM HYDROBROMIDE 20 MG PO TABS
10.0000 mg | ORAL_TABLET | Freq: Every day | ORAL | Status: DC
  Administered 2018-12-08 – 2018-12-09 (×2): 10 mg via ORAL
  Filled 2018-12-08 (×2): qty 1

## 2018-12-08 MED ORDER — OSELTAMIVIR PHOSPHATE 75 MG PO CAPS
75.0000 mg | ORAL_CAPSULE | Freq: Once | ORAL | Status: AC
  Administered 2018-12-08: 75 mg via ORAL
  Filled 2018-12-08: qty 1

## 2018-12-08 NOTE — ED Triage Notes (Signed)
Pt states dizziness and wheezing since yesterday. Wheezing heard with cough. A&O, ambulatory. No distress noted. Denies eating anything today. Took ibu and cough medicine earlier today. Cough noted on triage. States "i'll get a fever and it'll break and then it will come back."

## 2018-12-08 NOTE — ED Notes (Signed)
Patient laying in bed resting. Medications given per orders. Will continue to monitor.

## 2018-12-08 NOTE — ED Notes (Signed)
Pt placed on 2L via [place], O2 sat 93% on 2L.

## 2018-12-08 NOTE — H&P (Signed)
[facility] [place] NAME: Melanie Whitaker    MR#:  [id number]  DATE OF BIRTH:  [birth year]  DATE OF ADMISSION:  12/08/2018  PRIMARY CARE PHYSICIAN: Patient, No Pcp Per   REQUESTING/REFERRING PHYSICIAN: Lavonia Drafts, MD  CHIEF COMPLAINT:  Shortness of breath with  dizziness  HISTORY OF PRESENT ILLNESS:  Melanie Whitaker  is a 33 y.o. female with a known history of depression and other significant medical history is presenting to the ED with a chief complaint of cough, body aches chills associated with dizziness.  The symptoms have been going on for the past 2 days and she is reporting that her son is sick with influenza A and patient is requiring 2 L of oxygen via nasal cannula.  Chest x-ray is negative but flu test is positive.  Patient is started on Tamiflu and hospitalist team is called admit the patient.  Patient reports generalized weakness and chest tenderness with cough.  Denies any history of smoking  PAST MEDICAL HISTORY:  Depression  PAST SURGICAL HISTOIRY:   Past Surgical History:  Procedure Laterality Date  . APPENDECTOMY    . CHOLECYSTECTOMY    . TONSILLECTOMY    . TUBAL LIGATION      SOCIAL HISTORY:   Social History   Tobacco Use  . Smoking status: Never Smoker  . Smokeless tobacco: Never Used  Substance Use Topics  . Alcohol use: No    FAMILY HISTORY:  History reviewed. No pertinent family history.  DRUG ALLERGIES:   Allergies  Allergen Reactions  . Sulfa Antibiotics Anaphylaxis  . Penicillins   . Phenergan [Promethazine Hcl] Other (See Comments)    halluciations     REVIEW OF SYSTEMS:  CONSTITUTIONAL: No fever, fatigue.  Reporting generalized weakness.  Feeling dizzy EYES: No blurred or double vision.  EARS, NOSE, AND THROAT: No tinnitus or ear pain.  RESPIRATORY: Endorsing cough, shortness of breath, wheezing, denies hemoptysis.  CARDIOVASCULAR: No chest pain, orthopnea, edema.  GASTROINTESTINAL:  No nausea, vomiting, diarrhea or abdominal pain.  GENITOURINARY: No dysuria, hematuria.  ENDOCRINE: No polyuria, nocturia,  HEMATOLOGY: No anemia, easy bruising or bleeding SKIN: No rash or lesion. MUSCULOSKELETAL: No joint pain or arthritis.   NEUROLOGIC: No tingling, numbness, weakness.  PSYCHIATRY: No anxiety or depression.   MEDICATIONS AT HOME:   Prior to Admission medications   Medication Sig Start Date End Date Taking? Authorizing Provider  citalopram (CELEXA) 10 MG tablet Take 10 mg by mouth at bedtime. 12/04/18  Yes [provider]  albuterol (PROVENTIL HFA;VENTOLIN HFA) 108 (90 Base) MCG/ACT inhaler Inhale 2 puffs into the lungs every 6 (six) hours as needed. Patient not taking: Reported on 12/08/2018 03/29/18   Orbie Pyo, MD  albuterol (PROVENTIL HFA;VENTOLIN HFA) 108 (90 Base) MCG/ACT inhaler Inhale 2 puffs into the lungs every 6 (six) hours as needed. Patient not taking: Reported on 12/08/2018 03/29/18   Orbie Pyo, MD  chlorpheniramine-HYDROcodone [facility] PENNKINETIC ER) 10-8 MG/5ML SUER Take 5 mLs by mouth every 12 (twelve) hours as needed. Patient not taking: Reported on 12/08/2018 12/13/16   Gregor Hams, MD  chlorpheniramine-HYDROcodone [facility] ER) 10-8 MG/5ML SUER Take 5 mLs by mouth at bedtime as needed for cough. Patient not taking: Reported on 12/08/2018 01/06/18   Sable Feil, PA-C  ibuprofen (ADVIL,MOTRIN) 600 MG tablet Take 1 tablet (600 mg total) by mouth every 8 (eight) hours as needed. Patient not taking: Reported on 12/08/2018 01/06/18   Tamala Julian,  Hinda Lenis, PA-C  triamcinolone cream (KENALOG) 0.1 % Apply 1 application topically 4 (four) times daily. Patient not taking: Reported on 12/08/2018 05/07/17   Cuthriell, Charline Bills, PA-C      VITAL SIGNS:  Blood pressure 119/85, pulse (!) 104, temperature 99.8 F (37.7 C), temperature source Oral, resp. rate (!) 22, height 5\' 10"  (1.778 m), weight 90.7 kg, last  menstrual period 11/25/2018, SpO2 (!) 86 %.  PHYSICAL EXAMINATION:  GENERAL:  33 y.o.-year-old patient lying in the bed with no acute distress.  EYES: Pupils equal, round, reactive to light and accommodation. No scleral icterus. Extraocular muscles intact.  HEENT: Head atraumatic, normocephalic. Oropharynx and nasopharynx clear.  NECK:  Supple, no jugular venous distention. No thyroid enlargement, no tenderness.  LUNGS: Normal breath sounds bilaterally, positive minimal diffuse wheezing, no rales,rhonchi or crepitation. No use of accessory muscles of respiration.  CARDIOVASCULAR: S1, S2 normal. No murmurs, rubs, or gallops.  ABDOMEN: Soft, nontender, nondistended. Bowel sounds present.  EXTREMITIES: No pedal edema, cyanosis, or clubbing.  NEUROLOGIC: Cranial nerves II through XII are intact. Muscle strength 5/5 in all extremities. Sensation intact. Gait not checked.  PSYCHIATRIC: The patient is alert and oriented x 3.  SKIN: No obvious rash, lesion, or ulcer.   LABORATORY PANEL:   CBC Recent Labs  Lab 12/08/18 1630  WBC 6.3  HGB 13.3  HCT 40.7  PLT 278   ------------------------------------------------------------------------------------------------------------------  Chemistries  Recent Labs  Lab 12/08/18 1630  NA 137  K 3.5  CL 104  CO2 23  GLUCOSE 130*  BUN 13  CREATININE 0.75  CALCIUM 9.4   ------------------------------------------------------------------------------------------------------------------  Cardiac Enzymes No results for input(s): TROPONINI in the last 168 hours. ------------------------------------------------------------------------------------------------------------------  RADIOLOGY:  Dg Chest 2 View  Result Date: 12/08/2018 CLINICAL DATA:  Dizziness, cough and wheezing. EXAM: CHEST - 2 VIEW COMPARISON:  03/29/2018 FINDINGS: The heart size and mediastinal contours are within normal limits. There is no evidence of pulmonary edema, consolidation,  pneumothorax, nodule or pleural fluid. The visualized skeletal structures are unremarkable. IMPRESSION: No active cardiopulmonary disease. Electronically Signed   By: Aletta Edouard M.D.   On: 12/08/2018 18:57    EKG:   Orders placed or performed during the hospital encounter of 03/29/18  . ED EKG within 10 minutes  . ED EKG within 10 minutes  . EKG 12-Lead  . EKG 12-Lead  . EKG    IMPRESSION AND PLAN:  #Acute hypoxic respiratory failure from flu, acute bronchitis with bronchoconstriction Admit to MedSurg unit, oxygen via nasal cannula, bronchodilator treatments and Tamiflu  #Acute bronchitis with bronchoconstriction secondary to underlying flu Steroids IV, bronchodilator treatments  #Influenza positive Chest x-ray negative for pneumonia Tamiflu Hydrate with IV fluids and supportive treatment  #Chronic depression no suicidal or homicidal ideation Resume home medication Celexa 10 mg p.o. nightly  DVT prophylaxis Lovenox SQ  All the records are reviewed and case discussed with ED provider. Management plans discussed with the patient, family and they are in agreement.  CODE STATUS: fc   TOTAL TIME TAKING CARE OF THIS PATIENT: 41  minutes.   Note: This dictation was prepared with Dragon dictation along with smaller phrase technology. Any transcriptional errors that result from this process are unintentional.  Nicholes Mango M.D on 12/08/2018 at 10:18 PM  Between 7am to 6pm - Pager - [phone]  After 6pm go to [email or web address] - password [email or web address] [place] [facility]  Office  [phone]  CC: Primary care physician; Patient, No Pcp Per

## 2018-12-08 NOTE — ED Provider Notes (Addendum)
[facility] Emergency Department Provider Note   ____________________________________________    I have reviewed the triage vital signs and the nursing notes.   HISTORY  Chief Complaint Dizziness     HPI Melanie Whitaker is a 33 y.o. female who presents with complaints of cough, dizziness, body aches, chills.  Patient reports symptom started 2 days ago.  She reports her son was recently diagnosed with influenza A.  She denies shortness of breath but does complain of productive cough.  Has not taken anything except over-the-counter medications with little relief.  She reports when she stands up she feels lightheaded.  No chest pain or palpitations.  No nausea or vomiting but decreased p.o. appetite  History reviewed. No pertinent past medical history.  There are no active problems to display for this patient.   Past Surgical History:  Procedure Laterality Date  . APPENDECTOMY    . CHOLECYSTECTOMY    . TONSILLECTOMY    . TUBAL LIGATION      Prior to Admission medications   Medication Sig Start Date End Date Taking? Authorizing Provider  albuterol (PROVENTIL HFA;VENTOLIN HFA) 108 (90 Base) MCG/ACT inhaler Inhale 2 puffs into the lungs every 6 (six) hours as needed. 03/29/18   Schaevitz, Randall An, MD  albuterol (PROVENTIL HFA;VENTOLIN HFA) 108 (90 Base) MCG/ACT inhaler Inhale 2 puffs into the lungs every 6 (six) hours as needed. 03/29/18   Orbie Pyo, MD  chlorpheniramine-HYDROcodone (TUSSIONEX PENNKINETIC ER) 10-8 MG/5ML SUER Take 5 mLs by mouth every 12 (twelve) hours as needed. 12/13/16   Gregor Hams, MD  chlorpheniramine-HYDROcodone [facility] PENNKINETIC ER) 10-8 MG/5ML SUER Take 5 mLs by mouth at bedtime as needed for cough. 01/06/18   Sable Feil, PA-C  ibuprofen (ADVIL,MOTRIN) 600 MG tablet Take 1 tablet (600 mg total) by mouth every 8 (eight) hours as needed. 01/06/18   Sable Feil, PA-C  triamcinolone cream (KENALOG)  0.1 % Apply 1 application topically 4 (four) times daily. 05/07/17   Cuthriell, Charline Bills, PA-C     Allergies Sulfa antibiotics; Penicillins; and Phenergan [promethazine hcl]  History reviewed. No pertinent family history.  Social History Social History   Tobacco Use  . Smoking status: Never Smoker  . Smokeless tobacco: Never Used  Substance Use Topics  . Alcohol use: No  . Drug use: Not on file    Review of Systems  Constitutional: As above Eyes: No visual changes.  ENT: No sore throat. Cardiovascular: Denies chest pain. Respiratory: Denies shortness of breath.  Positive cough Gastrointestinal: No abdominal pain.  No nausea, no vomiting.   Genitourinary: Negative for dysuria. Musculoskeletal: Positive myalgias Skin: Negative for rash. Neurological: Negative for headaches   ____________________________________________   PHYSICAL EXAM:  VITAL SIGNS: ED Triage Vitals [12/08/18 1628]  Enc Vitals Group     BP (!) 109/59     Pulse Rate (!) 115     Resp 18     Temp 99.8 F (37.7 C)     Temp Source Oral     SpO2 95 %     Weight 90.7 kg (200 lb)     Height 1.778 m (5\' 10" )     Head Circumference      Peak Flow      Pain Score 10     Pain Loc      Pain Edu?      Excl. in [place]?     Constitutional: Alert and oriented. No acute distress.   Nose:  No congestion/rhinnorhea. Mouth/Throat: Mucous membranes are moist.    Cardiovascular: Tachycardia regular rhythm. Grossly normal heart sounds.  Good peripheral circulation. Respiratory: Normal respiratory effort.  No retractions. Lungs CTAB. Gastrointestinal: Soft and nontender. No distention.  No CVA tenderness.  Musculoskeletal: No lower extremity tenderness nor edema.  Warm and well perfused Neurologic:  Normal speech and language. No gross focal neurologic deficits are appreciated.  Skin:  Skin is warm, dry and intact. No rash noted. Psychiatric: Mood and affect are normal. Speech and behavior are  normal.  ____________________________________________   LABS (all labs ordered are listed, but only abnormal results are displayed)  Labs Reviewed  BASIC METABOLIC PANEL - Abnormal; Notable for the following components:      Result Value   Glucose, Bld 130 (*)    All other components within normal limits  INFLUENZA PANEL BY PCR (TYPE A & B) - Abnormal; Notable for the following components:   Influenza A By PCR POSITIVE (*)    All other components within normal limits  CBC  URINALYSIS, COMPLETE (UACMP) WITH MICROSCOPIC  CBG MONITORING, ED  POC URINE PREG, ED   ____________________________________________  EKG  None ____________________________________________  RADIOLOGY  Chest x-ray negative for pneumonia ____________________________________________   PROCEDURES  Procedure(s) performed: No  Procedures   Critical Care performed: yes  CRITICAL CARE Performed by: Lavonia Drafts   Total critical care time: 30 minutes  Critical care time was exclusive of separately billable procedures and treating other patients.  Critical care was necessary to treat or prevent imminent or life-threatening deterioration.  Critical care was time spent personally by me on the following activities: development of treatment plan with patient and/or surrogate as well as nursing, discussions with consultants, evaluation of patient's response to treatment, examination of patient, obtaining history from patient or surrogate, ordering and performing treatments and interventions, ordering and review of laboratory studies, ordering and review of radiographic studies, pulse oximetry and re-evaluation of patient's condition.  ____________________________________________   INITIAL IMPRESSION / ASSESSMENT AND PLAN / ED COURSE  Pertinent labs & imaging results that were available during my care of the patient were reviewed by me and considered in my medical decision making (see chart for  details).  Patient presents with complaints of cough, weakness, dizziness, myalgias.  Influenza a positive which likely explains her symptoms.  Will treat with DuoNeb for wheezing likely bronchitis as well, chest x-ray negative for pneumonia.  ----------------------------------------- 8:54 PM on 12/08/2018 -----------------------------------------  Patient without significant improvement with nebulizers and steroids, in fact now has new oxygen requirement, room air sat of 86% still significant wheezing.  Will discuss with the hospitalist for admission    ____________________________________________   FINAL CLINICAL IMPRESSION(S) / ED DIAGNOSES  Final diagnoses:  Bronchitis with bronchospasm  Influenza A  SOB (shortness of breath)  Lightheadedness        Note:  This document was prepared using Dragon voice recognition software and may include unintentional dictation errors.   Lavonia Drafts, MD 12/08/18 2055    Lavonia Drafts, MD 12/08/18 2055

## 2018-12-08 NOTE — ED Notes (Signed)
Pt remains with significant wheezing noted at this time. States she vomited prior to this RN coming to bedside. MD made aware.

## 2018-12-08 NOTE — ED Notes (Signed)
Pt desat to 90% on 2L, turned up to 3L via [place], O2 sats 96%.

## 2018-12-09 LAB — COMPREHENSIVE METABOLIC PANEL
ALT: 55 U/L — ABNORMAL HIGH (ref 0–44)
AST: 37 U/L (ref 15–41)
Albumin: 4 g/dL (ref 3.5–5.0)
Alkaline Phosphatase: 73 U/L (ref 38–126)
Anion gap: 9 (ref 5–15)
BUN: 13 mg/dL (ref 6–20)
CHLORIDE: 106 mmol/L (ref 98–111)
CO2: 24 mmol/L (ref 22–32)
Calcium: 9.1 mg/dL (ref 8.9–10.3)
Creatinine, Ser: 0.55 mg/dL (ref 0.44–1.00)
GFR calc Af Amer: >60 mL/min (ref >60)
GFR calc non Af Amer: >60 mL/min (ref >60)
GLUCOSE: 148 mg/dL — AB (ref 70–99)
Potassium: 4.1 mmol/L (ref 3.5–5.1)
Sodium: 139 mmol/L (ref 135–145)
Total Bilirubin: 0.5 mg/dL (ref 0.3–1.2)
Total Protein: 7.9 g/dL (ref 6.5–8.1)

## 2018-12-09 LAB — CBC
HEMATOCRIT: 40.2 % (ref 36.0–46.0)
Hemoglobin: 12.8 g/dL (ref 12.0–15.0)
MCH: 28.1 pg (ref 26.0–34.0)
MCHC: 31.8 g/dL (ref 30.0–36.0)
MCV: 88.4 fL (ref 80.0–100.0)
Platelets: 275 10*3/uL (ref 150–400)
RBC: 4.55 MIL/uL (ref 3.87–5.11)
RDW: 13.5 % (ref 11.5–15.5)
WBC: 6.3 10*3/uL (ref 4.0–10.5)
nRBC: 0 % (ref 0.0–0.2)

## 2018-12-09 MED ORDER — OSELTAMIVIR PHOSPHATE 75 MG PO CAPS: 75.0000 mg | ORAL_CAPSULE | Freq: Two times a day (BID) | ORAL | 0 refills | Status: DC

## 2018-12-09 MED ORDER — BENZONATATE 100 MG PO CAPS: 100.0000 mg | ORAL_CAPSULE | Freq: Three times a day (TID) | ORAL | Status: DC | PRN

## 2018-12-09 MED ORDER — IPRATROPIUM-ALBUTEROL 0.5-2.5 (3) MG/3ML IN SOLN
3.0000 mL | Freq: Three times a day (TID) | RESPIRATORY_TRACT | Status: DC
  Filled 2018-12-09: qty 3

## 2018-12-09 MED ORDER — BENZONATATE 100 MG PO CAPS: 100.0000 mg | ORAL_CAPSULE | Freq: Three times a day (TID) | ORAL | 0 refills | Status: DC | PRN

## 2018-12-09 NOTE — Progress Notes (Signed)
Patient discharged home per MD order. All discharge instructions given and all questions answered. 

## 2018-12-09 NOTE — Discharge Instructions (Signed)
Follow-up urgent care/ER if needed

## 2018-12-09 NOTE — ED Notes (Signed)
ED TO INPATIENT HANDOFF REPORT  ED Nurse Name and Phone #:  Maudie Mercury 66  S Name/Age/Gender Melanie Whitaker 33 y.o. female Room/Bed: ED03A/ED03A  Code Status   Code Status: Full Code  Home/SNF/Other Home Patient oriented to: self, place, time and situation Is this baseline? Yes   Triage Complete: Triage complete  Chief Complaint cough  Triage Note Pt states dizziness and wheezing since yesterday. Wheezing heard with cough. A&O, ambulatory. No distress noted. Denies eating anything today. Took ibu and cough medicine earlier today. Cough noted on triage. States "i'll get a fever and it'll break and then it will come back."    Allergies Allergies  Allergen Reactions  . Sulfa Antibiotics Anaphylaxis  . Penicillins   . Phenergan [Promethazine Hcl] Other (See Comments)    halluciations     Level of Care/Admitting Diagnosis ED Disposition    ED Disposition Condition [facility] Area: [place] [[id number]]  Level of Care: Med-Surg [16]  Diagnosis: Acute bronchitis [466.0.ICD-9-CM]  Admitting Physician: Nicholes Mango [[id number]]  Attending Physician: Nicholes Mango [[id number]]  Estimated length of stay: 3 - 4 days  Certification:: I certify this patient will need inpatient services for at least 2 midnights  PT Class (Do Not Modify): Inpatient [101]  PT Acc Code (Do Not Modify): Private [1]       B Medical/Surgery History History reviewed. No pertinent past medical history. Past Surgical History:  Procedure Laterality Date  . APPENDECTOMY    . CHOLECYSTECTOMY    . TONSILLECTOMY    . TUBAL LIGATION       A IV Location/Drains/Wounds Patient Lines/Drains/Airways Status   Active Line/Drains/Airways    Name:   Placement date:   Placement time:   Site:   Days:   Peripheral IV 12/08/18 Right Hand   12/08/18    2109    Hand   1          Intake/Output Last 24 hours No intake or output data in the 24 hours ending 12/09/18  0013  Labs/Imaging Results for orders placed or performed during the hospital encounter of 12/08/18 (from the past 48 hour(s))  Basic metabolic panel     Status: Abnormal   Collection Time: 12/08/18  4:30 PM  Result Value Ref Range   Sodium 137 135 - 145 mmol/L   Potassium 3.5 3.5 - 5.1 mmol/L   Chloride 104 98 - 111 mmol/L   CO2 23 22 - 32 mmol/L   Glucose, Bld 130 (H) 70 - 99 mg/dL   BUN 13 6 - 20 mg/dL   Creatinine, Ser 0.75 0.44 - 1.00 mg/dL   Calcium 9.4 8.9 - 10.3 mg/dL   GFR calc non Af Amer >60 >60 mL/min   GFR calc Af Amer >60 >60 mL/min   Anion gap 10 5 - 15    Comment: Performed at [facility], [address] [place], [place] [address]  CBC     Status: None   Collection Time: 12/08/18  4:30 PM  Result Value Ref Range   WBC 6.3 4.0 - 10.5 K/uL   RBC 4.65 3.87 - 5.11 MIL/uL   Hemoglobin 13.3 12.0 - 15.0 g/dL   HCT 40.7 36.0 - 46.0 %   MCV 87.5 80.0 - 100.0 fL   MCH 28.6 26.0 - 34.0 pg   MCHC 32.7 30.0 - 36.0 g/dL   RDW 13.7 11.5 - 15.5 %   Platelets 278 150 - 400 K/uL   nRBC  0.0 0.0 - 0.2 %    Comment: Performed at [facility], [address] [place], [place] [address]  Influenza panel by PCR (type A & B)     Status: Abnormal   Collection Time: 12/08/18  4:30 PM  Result Value Ref Range   Influenza A By PCR POSITIVE (A) NEGATIVE   Influenza B By PCR NEGATIVE NEGATIVE    Comment: (NOTE) The Xpert Xpress Flu assay is intended as an aid in the diagnosis of  influenza and should not be used as a sole basis for treatment.  This  assay is FDA approved for nasopharyngeal swab specimens only. Nasal  washings and aspirates are unacceptable for Xpert Xpress Flu testing. Performed at [facility], [address], [place], [place] [address]    Dg Chest 2 View  Result Date: 12/08/2018 CLINICAL DATA:  Dizziness, cough and wheezing. EXAM: CHEST - 2 VIEW COMPARISON:  03/29/2018 FINDINGS: The heart size and mediastinal contours are within  normal limits. There is no evidence of pulmonary edema, consolidation, pneumothorax, nodule or pleural fluid. The visualized skeletal structures are unremarkable. IMPRESSION: No active cardiopulmonary disease. Electronically Signed   By: Aletta Edouard M.D.   On: 12/08/2018 18:57    Pending Labs Unresulted Labs (From admission, onward)    Start     Ordered   12/15/18 0500  Creatinine, serum  (enoxaparin (LOVENOX)    CrCl >/= 30 ml/min)  Weekly,   STAT    Comments:  while on enoxaparin therapy    12/08/18 2234   12/09/18 0500  Comprehensive metabolic panel  Tomorrow morning,   STAT     12/08/18 2234   12/09/18 0500  CBC  Tomorrow morning,   STAT     12/08/18 2234   12/08/18 2235  HIV antibody (Routine Testing)  Once,   STAT     12/08/18 2234   12/08/18 2235  CBC  (enoxaparin (LOVENOX)    CrCl >/= 30 ml/min)  Once,   STAT    Comments:  Baseline for enoxaparin therapy IF NOT ALREADY DRAWN.  Notify MD if PLT < 100 K.    12/08/18 2234   12/08/18 2235  Creatinine, serum  (enoxaparin (LOVENOX)    CrCl >/= 30 ml/min)  Once,   STAT    Comments:  Baseline for enoxaparin therapy IF NOT ALREADY DRAWN.    12/08/18 2234   12/08/18 2235  Culture, sputum-assessment  Once,   STAT     12/08/18 2234   12/08/18 1629  Urinalysis, Complete w Microscopic  ONCE - STAT,   STAT     12/08/18 1629          Vitals/Pain Today's Vitals   12/08/18 2226 12/08/18 2227 12/08/18 2228 12/08/18 2229  BP: 116/79     Pulse:  93 (!) 103 99  Resp:      Temp:      TempSrc:      SpO2:  96% 97% 92%  Weight:      Height:      PainSc:        Isolation Precautions Droplet precaution  Medications Medications  oseltamivir (TAMIFLU) capsule 75 mg (75 mg Oral Given 12/08/18 2258)  albuterol (PROVENTIL) (2.5 MG/3ML) 0.083% nebulizer solution 2.5 mg (has no administration in time range)  ipratropium-albuterol (DUONEB) 0.5-2.5 (3) MG/3ML nebulizer solution 3 mL (3 mLs Nebulization Not Given 12/08/18 2235)  citalopram  (CELEXA) tablet 10 mg (10 mg Oral Given 12/08/18 2258)  enoxaparin (LOVENOX) injection 40  mg (40 mg Subcutaneous Given 12/08/18 2258)  0.9 %  sodium chloride infusion ( Intravenous New Bag/Given 12/08/18 2300)  acetaminophen (TYLENOL) tablet 650 mg (has no administration in time range)    Or  acetaminophen (TYLENOL) suppository 650 mg (has no administration in time range)  ibuprofen (ADVIL,MOTRIN) tablet 400 mg (has no administration in time range)  ondansetron (ZOFRAN) tablet 4 mg (has no administration in time range)    Or  ondansetron (ZOFRAN) injection 4 mg (has no administration in time range)  docusate sodium (COLACE) capsule 100 mg (100 mg Oral Given 12/08/18 2258)  methylPREDNISolone sodium succinate (SOLU-MEDROL) 125 mg/2 mL injection 60 mg (60 mg Intravenous Given 12/08/18 2237)  ipratropium-albuterol (DUONEB) 0.5-2.5 (3) MG/3ML nebulizer solution 3 mL (3 mLs Nebulization Given 12/08/18 1820)  ipratropium-albuterol (DUONEB) 0.5-2.5 (3) MG/3ML nebulizer solution 3 mL (3 mLs Nebulization Given 12/08/18 1820)  dexamethasone (DECADRON) injection 10 mg (10 mg Intramuscular Given 12/08/18 1820)  oseltamivir (TAMIFLU) capsule 75 mg (75 mg Oral Given 12/08/18 1820)  ipratropium-albuterol (DUONEB) 0.5-2.5 (3) MG/3ML nebulizer solution 3 mL (3 mLs Nebulization Given 12/08/18 1943)    Mobility walks Low fall risk   Focused Assessments Pulmonary Assessment Handoff:  Lung sounds: Bilateral Breath Sounds: Expiratory wheezes O2 Device: Room Air     ,    R Recommendations: See Admitting Provider Note  Report given to:   Additional Notes:  n/a

## 2018-12-09 NOTE — ED Notes (Signed)
Attempted to call report. RN unable to take report at time. Will call back in 10 mins.

## 2018-12-09 NOTE — Discharge Summary (Signed)
[facility] NAME: Melanie Whitaker    MR#:  [id number]  DATE OF BIRTH:  July 08, 1986  DATE OF ADMISSION:  12/08/2018 ADMITTING PHYSICIAN: Nicholes Mango, MD  DATE OF DISCHARGE: 12/09/2018  PRIMARY CARE PHYSICIAN: Melanie Whitaker, No Pcp Per    ADMISSION DIAGNOSIS:  Lightheadedness [R42] SOB (shortness of breath) [R06.02] Influenza A [J10.1] Bronchitis with bronchospasm [J20.9]  DISCHARGE DIAGNOSIS:  influenza A  SECONDARY DIAGNOSIS:  History reviewed. No pertinent past medical history.  HOSPITAL COURSE:  Melanie Whitaker  is a 33 y.o. female with a known history of depression and other significant medical history is presenting to the ED with a chief complaint of cough, body aches chills associated with dizziness.  The symptoms have been going on for the past 2 days and she is reporting that her son is sick with influenza   #Acute hypoxic respiratory failure from flu with bronchoconstriction-- now resolved. -Received oxygen via nasal cannula--- wean to room air. Sats are 90 through 92% on room air. -PRN bronchodilator treatments and Tamiflu  #Influenza A positive Chest x-ray negative for pneumonia Tamiflu for five days received hydrate with IV fluids and supportive treatment  Pt feels better denies any dizziness  #Chronic depression  Resume home medication Celexa 10 mg p.o. nightly  DVT prophylaxis Lovenox SQ  Overall stable. Discharge to home. Melanie Whitaker agreeable.   CONSULTS OBTAINED:    DRUG ALLERGIES:   Allergies  Allergen Reactions  . Sulfa Antibiotics Anaphylaxis  . Penicillins   . Phenergan [Promethazine Hcl] Other (See Comments)    halluciations     DISCHARGE MEDICATIONS:   Allergies as of 12/09/2018      Reactions   Sulfa Antibiotics Anaphylaxis   Penicillins    Phenergan [promethazine Hcl] Other (See Comments)   halluciations      Medication List    STOP taking these medications   albuterol 108 (90  Base) MCG/ACT inhaler Commonly known as:  PROVENTIL HFA;VENTOLIN HFA   chlorpheniramine-HYDROcodone 10-8 MG/5ML Suer Commonly known as:  TUSSIONEX PENNKINETIC ER   ibuprofen 600 MG tablet Commonly known as:  ADVIL,MOTRIN   triamcinolone cream 0.1 % Commonly known as:  KENALOG     TAKE these medications   benzonatate 100 MG capsule Commonly known as:  TESSALON Take 1 capsule (100 mg total) by mouth 3 (three) times daily as needed for cough.   citalopram 10 MG tablet Commonly known as:  CELEXA Take 10 mg by mouth at bedtime.   oseltamivir 75 MG capsule Commonly known as:  TAMIFLU Take 1 capsule (75 mg total) by mouth 2 (two) times daily.       If you experience worsening of your admission symptoms, develop shortness of breath, life threatening emergency, suicidal or homicidal thoughts you must seek medical attention immediately by calling 911 or calling your MD immediately  if symptoms less severe.  You Must read complete instructions/literature along with all the possible adverse reactions/side effects for all the Medicines you take and that have been prescribed to you. Take any new Medicines after you have completely understood and accept all the possible adverse reactions/side effects.   Please note  You were cared for by a hospitalist during your hospital stay. If you have any questions about your discharge medications or the care you received while you were in the hospital after you are discharged, you can call the unit and asked to speak with the hospitalist on call if the hospitalist that took care  of you is not available. Once you are discharged, your primary care physician will handle any further medical issues. Please note that NO REFILLS for any discharge medications will be authorized once you are discharged, as it is imperative that you return to your primary care physician (or establish a relationship with a primary care physician if you do not have one) for your  aftercare needs so that they can reassess your need for medications and monitor your lab values. Today   SUBJECTIVE   Feels better today. Trying to eat some breakfast. No fever.  VITAL SIGNS:  Blood pressure 114/70, pulse 77, temperature 98.1 F (36.7 C), temperature source Oral, resp. rate 20, height 5\' 10"  (1.778 m), weight 90.7 kg, last menstrual period 11/25/2018, SpO2 92 %.  I/O:    Intake/Output Summary (Last 24 hours) at 12/09/2018 0939 Last data filed at 12/09/2018 2423 Gross per 24 hour  Intake 1242.16 ml  Output 0 ml  Net 1242.16 ml    PHYSICAL EXAMINATION:  GENERAL:  33 y.o.-year-old Melanie Whitaker lying in the bed with no acute distress.  EYES: Pupils equal, round, reactive to light and accommodation. No scleral icterus. Extraocular muscles intact.  HEENT: Head atraumatic, normocephalic. Oropharynx and nasopharynx clear.  NECK:  Supple, no jugular venous distention. No thyroid enlargement, no tenderness.  LUNGS: Normal breath sounds bilaterally, no wheezing, rales,rhonchi or crepitation. No use of accessory muscles of respiration.  CARDIOVASCULAR: S1, S2 normal. No murmurs, rubs, or gallops.  ABDOMEN: Soft, non-tender, non-distended. Bowel sounds present. No organomegaly or mass.  EXTREMITIES: No pedal edema, cyanosis, or clubbing.  NEUROLOGIC: Cranial nerves II through XII are intact. Muscle strength 5/5 in all extremities. Sensation intact. Gait not checked.  PSYCHIATRIC: The Melanie Whitaker is alert and oriented x 3.  SKIN: No obvious rash, lesion, or ulcer.   DATA REVIEW:   CBC  Recent Labs  Lab 12/09/18 0431  WBC 6.3  HGB 12.8  HCT 40.2  PLT 275    Chemistries  Recent Labs  Lab 12/09/18 0431  NA 139  K 4.1  CL 106  CO2 24  GLUCOSE 148*  BUN 13  CREATININE 0.55  CALCIUM 9.1  AST 37  ALT 55*  ALKPHOS 73  BILITOT 0.5    Microbiology Results   No results found for this or any previous visit (from the past 240 hour(s)).  RADIOLOGY:  Dg Chest 2  View  Result Date: 12/08/2018 CLINICAL DATA:  Dizziness, cough and wheezing. EXAM: CHEST - 2 VIEW COMPARISON:  03/29/2018 FINDINGS: The heart size and mediastinal contours are within normal limits. There is no evidence of pulmonary edema, consolidation, pneumothorax, nodule or pleural fluid. The visualized skeletal structures are unremarkable. IMPRESSION: No active cardiopulmonary disease. Electronically Signed   By: Aletta Edouard M.D.   On: 12/08/2018 18:57     CODE STATUS:     Code Status Orders  (From admission, onward)         Start     Ordered   12/08/18 2235  Full code  Continuous     12/08/18 2234        Code Status History    This Melanie Whitaker has a current code status but no historical code status.      TOTAL TIME TAKING CARE OF THIS Melanie Whitaker: *40  minutes.    Fritzi Mandes M.D on 12/09/2018 at 9:39 AM  Between 7am to 6pm - Pager - [phone] After 6pm go to [email or web address] - password [email or web address]  [employer]  Office  [phone]  CC: Primary care physician; Melanie Whitaker, No Pcp Per

## 2018-12-09 NOTE — Care Management (Signed)
No discharge needs identified by members of the care team 

## 2018-12-10 LAB — HIV ANTIBODY (ROUTINE TESTING W REFLEX): HIV Screen 4th Generation wRfx: NONREACTIVE

## 2019-04-05 ENCOUNTER — Ambulatory Visit: Payer: Self-pay | Admitting: *Deleted

## 2019-04-05 ENCOUNTER — Other Ambulatory Visit: Payer: Self-pay

## 2019-04-05 DIAGNOSIS — Z20822 Contact with and (suspected) exposure to covid-19: Secondary | ICD-10-CM

## 2019-04-05 NOTE — Telephone Encounter (Signed)
I received a call from [place] with the [facility] Dept requesting testing for the COVID-19.   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  I scheduled her for today at 12:45 at the [facility] location in [place].  Pt made aware to stay in car and wear a mask that it was a drive thru testing site.  Order entered  No insurance.

## 2019-04-06 LAB — NOVEL CORONAVIRUS, NAA: SARS-CoV-2, NAA: NOT DETECTED

## 2019-06-06 ENCOUNTER — Emergency Department
Admission: EM | Admit: 2019-06-06 | Discharge: 2019-06-06 | Disposition: A | Discharge status: Home / Self Care | Payer: Self-pay | Attending: Emergency Medicine | Admitting: Emergency Medicine

## 2019-06-06 ENCOUNTER — Other Ambulatory Visit: Payer: Self-pay

## 2019-06-06 ENCOUNTER — Encounter: Payer: Self-pay | Admitting: Emergency Medicine

## 2019-06-06 DIAGNOSIS — Z79899 Other long term (current) drug therapy: Secondary | ICD-10-CM | POA: Insufficient documentation

## 2019-06-06 DIAGNOSIS — R102 Pelvic and perineal pain: Secondary | ICD-10-CM | POA: Insufficient documentation

## 2019-06-06 DIAGNOSIS — N939 Abnormal uterine and vaginal bleeding, unspecified: Secondary | ICD-10-CM | POA: Insufficient documentation

## 2019-06-06 LAB — BASIC METABOLIC PANEL
Anion gap: 8 (ref 5–15)
BUN: 10 mg/dL (ref 6–20)
CO2: 27 mmol/L (ref 22–32)
Calcium: 9.3 mg/dL (ref 8.9–10.3)
Chloride: 102 mmol/L (ref 98–111)
Creatinine, Ser: 0.56 mg/dL (ref 0.44–1.00)
GFR calc Af Amer: >60 mL/min (ref >60)
GFR calc non Af Amer: >60 mL/min (ref >60)
Glucose, Bld: 86 mg/dL (ref 70–99)
Potassium: 4 mmol/L (ref 3.5–5.1)
Sodium: 137 mmol/L (ref 135–145)

## 2019-06-06 LAB — CBC WITH DIFFERENTIAL/PLATELET
Abs Immature Granulocytes: 0.03 10*3/uL (ref 0.00–0.07)
Basophils Absolute: 0 10*3/uL (ref 0.0–0.1)
Basophils Relative: 1 %
Eosinophils Absolute: 0.3 10*3/uL (ref 0.0–0.5)
Eosinophils Relative: 3 %
HCT: 39.7 % (ref 36.0–46.0)
Hemoglobin: 12.8 g/dL (ref 12.0–15.0)
Immature Granulocytes: 0 %
Lymphocytes Relative: 30 %
Lymphs Abs: 2.6 10*3/uL (ref 0.7–4.0)
MCH: 28.8 pg (ref 26.0–34.0)
MCHC: 32.2 g/dL (ref 30.0–36.0)
MCV: 89.4 fL (ref 80.0–100.0)
Monocytes Absolute: 0.6 10*3/uL (ref 0.1–1.0)
Monocytes Relative: 7 %
Neutro Abs: 5.1 10*3/uL (ref 1.7–7.7)
Neutrophils Relative %: 59 %
Platelets: 279 10*3/uL (ref 150–400)
RBC: 4.44 MIL/uL (ref 3.87–5.11)
RDW: 13.8 % (ref 11.5–15.5)
WBC: 8.6 10*3/uL (ref 4.0–10.5)
nRBC: 0 % (ref 0.0–0.2)

## 2019-06-06 LAB — WET PREP, GENITAL
Clue Cells Wet Prep HPF POC: NONE SEEN
Sperm: NONE SEEN
Trich, Wet Prep: NONE SEEN
Yeast Wet Prep HPF POC: NONE SEEN

## 2019-06-06 LAB — POCT PREGNANCY, URINE: Preg Test, Ur: NEGATIVE

## 2019-06-06 NOTE — ED Notes (Signed)
Pt given urine specimen cup to obtain dirty sample for GC/CT

## 2019-06-06 NOTE — ED Provider Notes (Signed)
[facility] Emergency Department Provider Note   ____________________________________________   First MD Initiated Contact with Patient 06/06/19 1323     (approximate)  I have reviewed the triage vital signs and the nursing notes.   HISTORY  Chief Complaint Vaginal Bleeding    HPI Melanie Whitaker is a 33 y.o. female with no significant past medical history who presents to the ED for vaginal bleeding.  Patient reports approximately 2 weeks of heavy vaginal bleeding similar to a period.  She states she goes through a pad about every hour, has not noticed any discharge.  Her LMP was approximately 2 months ago, she does not believe she is pregnant because she is status post tubal ligation.  She denies any history of heavy periods, she does not currently have a gynecologist.  She complains of some lower abdominal cramping, but denies any overt pain.        History reviewed. No pertinent past medical history.  Patient Active Problem List   Diagnosis Date Noted  . Acute bronchitis 12/08/2018    Past Surgical History:  Procedure Laterality Date  . APPENDECTOMY    . CHOLECYSTECTOMY    . TONSILLECTOMY    . TUBAL LIGATION      Prior to Admission medications   Medication Sig Start Date End Date Taking? Authorizing Provider  benzonatate (TESSALON) 100 MG capsule Take 1 capsule (100 mg total) by mouth 3 (three) times daily as needed for cough. 12/09/18   Fritzi Mandes, MD  citalopram (CELEXA) 10 MG tablet Take 10 mg by mouth at bedtime. 12/04/18   [provider]  oseltamivir (TAMIFLU) 75 MG capsule Take 1 capsule (75 mg total) by mouth 2 (two) times daily. 12/09/18   Fritzi Mandes, MD    Allergies Sulfa antibiotics, Penicillins, and Phenergan [promethazine hcl]  No family history on file.  Social History Social History   Tobacco Use  . Smoking status: Never Smoker  . Smokeless tobacco: Never Used  Substance Use Topics  . Alcohol use: No  . Drug  use: Never    Review of Systems  Constitutional: No fever/chills Eyes: No visual changes. ENT: No sore throat. Cardiovascular: Denies chest pain. Respiratory: Denies shortness of breath. Gastrointestinal: No abdominal pain.  No nausea, no vomiting.  No diarrhea.  No constipation. Genitourinary: Negative for dysuria.  Positive for vaginal bleeding. Musculoskeletal: Negative for back pain. Skin: Negative for rash. Neurological: Negative for headaches, focal weakness or numbness.  ____________________________________________   PHYSICAL EXAM:  VITAL SIGNS: ED Triage Vitals  Enc Vitals Group     BP 06/06/19 1302 121/87     Pulse Rate 06/06/19 1302 72     Resp 06/06/19 1302 16     Temp 06/06/19 1302 99 F (37.2 C)     Temp Source 06/06/19 1302 Oral     SpO2 06/06/19 1302 99 %     Weight 06/06/19 1258 210 lb (95.3 kg)     Height 06/06/19 1258 5\' 10"  (1.778 m)     Head Circumference --      Peak Flow --      Pain Score 06/06/19 1258 7     Pain Loc --      Pain Edu? --      Excl. in [place]? --     Constitutional: Alert and oriented. Eyes: Conjunctivae are normal. Head: Atraumatic. Nose: No congestion/rhinnorhea. Mouth/Throat: Mucous membranes are moist. Neck: Normal ROM Cardiovascular: Normal rate, regular rhythm. Grossly normal heart sounds. Respiratory: Normal  respiratory effort.  No retractions. Lungs CTAB. Gastrointestinal: Soft and nontender. No distention. Genitourinary: Moderate amount of bleeding from cervical os, no cervical motion adnexal tenderness. Musculoskeletal: No lower extremity tenderness nor edema. Neurologic:  Normal speech and language. No gross focal neurologic deficits are appreciated. Skin:  Skin is warm, dry and intact. No rash noted. Psychiatric: Mood and affect are normal. Speech and behavior are normal.  ____________________________________________   LABS (all labs ordered are listed, but only abnormal results are displayed)  Labs Reviewed   WET PREP, GENITAL - Abnormal; Notable for the following components:      Result Value   WBC, Wet Prep HPF POC FEW (*)    All other components within normal limits  GC/CHLAMYDIA PROBE AMP  CBC WITH DIFFERENTIAL/PLATELET  BASIC METABOLIC PANEL  POC URINE PREG, ED  POCT PREGNANCY, URINE    PROCEDURES  Procedure(s) performed (including Critical Care):  Procedures   ____________________________________________   INITIAL IMPRESSION / ASSESSMENT AND PLAN / ED COURSE       Patient presenting for 2 weeks of heavy vaginal bleeding.  Pregnancy testing negative and H&H stable, remainder of labs unremarkable.  She will need to establish care with gynecology for further evaluation, referral provided.  Counseled patient to return to the ED for new or worsening symptoms, patient agrees with plan.      ____________________________________________   FINAL CLINICAL IMPRESSION(S) / ED DIAGNOSES  Final diagnoses:  Abnormal uterine bleeding (AUB)  Pelvic pain     ED Discharge Orders    None       Note:  This document was prepared using Dragon voice recognition software and may include unintentional dictation errors.   Blake Divine, MD 06/06/19 1537

## 2019-06-06 NOTE — ED Triage Notes (Signed)
Pt to ED via POV, pt states that she is having heavy vaginal bleeding and cramping. Pt states that she has been bleeding x 3 weeks. Pt states that her periods are not normally very heavy and she does not normally have a lot of pain with them. Pt is in NAD, playing on cell phone in triage.

## 2019-06-10 LAB — GC/CHLAMYDIA PROBE AMP
Chlamydia trachomatis, NAA: NEGATIVE
Neisseria Gonorrhoeae by PCR: NEGATIVE

## 2019-09-09 ENCOUNTER — Emergency Department: Payer: Self-pay

## 2019-09-09 ENCOUNTER — Other Ambulatory Visit: Payer: Self-pay

## 2019-09-09 ENCOUNTER — Encounter: Payer: Self-pay | Admitting: *Deleted

## 2019-09-09 DIAGNOSIS — Z5321 Procedure and treatment not carried out due to patient leaving prior to being seen by health care provider: Secondary | ICD-10-CM | POA: Insufficient documentation

## 2019-09-09 DIAGNOSIS — R062 Wheezing: Secondary | ICD-10-CM | POA: Insufficient documentation

## 2019-09-09 LAB — BASIC METABOLIC PANEL WITH GFR
Anion gap: 11 (ref 5–15)
BUN: 19 mg/dL (ref 6–20)
CO2: 22 mmol/L (ref 22–32)
Calcium: 9.2 mg/dL (ref 8.9–10.3)
Chloride: 105 mmol/L (ref 98–111)
Creatinine, Ser: 0.72 mg/dL (ref 0.44–1.00)
GFR calc Af Amer: >60 mL/min (ref >60)
GFR calc non Af Amer: >60 mL/min (ref >60)
Glucose, Bld: 117 mg/dL — ABNORMAL HIGH (ref 70–99)
Potassium: 3.9 mmol/L (ref 3.5–5.1)
Sodium: 138 mmol/L (ref 135–145)

## 2019-09-09 LAB — CBC
HCT: 35.4 % — ABNORMAL LOW (ref 36.0–46.0)
Hemoglobin: 11.9 g/dL — ABNORMAL LOW (ref 12.0–15.0)
MCH: 29.3 pg (ref 26.0–34.0)
MCHC: 33.6 g/dL (ref 30.0–36.0)
MCV: 87.2 fL (ref 80.0–100.0)
Platelets: 300 K/uL (ref 150–400)
RBC: 4.06 MIL/uL (ref 3.87–5.11)
RDW: 13.6 % (ref 11.5–15.5)
WBC: 12.3 K/uL — ABNORMAL HIGH (ref 4.0–10.5)
nRBC: 0 % (ref 0.0–0.2)

## 2019-09-09 MED ORDER — SODIUM CHLORIDE 0.9% FLUSH: 3.0000 mL | Freq: Once | INTRAVENOUS | Status: DC

## 2019-09-09 NOTE — ED Triage Notes (Signed)
Pt reports wheezing since yesterday with a cough.  Negative Covid today per patient.  Nonsmoker.  Hx pneumonia.  Pt alert  Speech clear.

## 2019-09-09 NOTE — ED Notes (Signed)
Pt noted ambulating in lobby with no difficulty or distress noted, eating chips

## 2019-09-10 ENCOUNTER — Emergency Department
Admission: EM | Admit: 2019-09-10 | Discharge: 2019-09-10 | Disposition: A | Discharge status: Home / Self Care | Payer: Self-pay | Attending: Emergency Medicine | Admitting: Emergency Medicine

## 2019-09-10 NOTE — ED Notes (Signed)
No answer when called several times from lobby 

## 2019-09-19 ENCOUNTER — Encounter: Payer: Self-pay | Admitting: Emergency Medicine

## 2019-09-19 ENCOUNTER — Other Ambulatory Visit: Payer: Self-pay

## 2019-09-19 ENCOUNTER — Emergency Department
Admission: EM | Admit: 2019-09-19 | Discharge: 2019-09-19 | Disposition: A | Discharge status: Home / Self Care | Payer: Self-pay | Attending: Emergency Medicine | Admitting: Emergency Medicine

## 2019-09-19 DIAGNOSIS — Z20828 Contact with and (suspected) exposure to other viral communicable diseases: Secondary | ICD-10-CM | POA: Insufficient documentation

## 2019-09-19 DIAGNOSIS — R05 Cough: Secondary | ICD-10-CM | POA: Insufficient documentation

## 2019-09-19 DIAGNOSIS — Z79899 Other long term (current) drug therapy: Secondary | ICD-10-CM | POA: Insufficient documentation

## 2019-09-19 DIAGNOSIS — R110 Nausea: Secondary | ICD-10-CM | POA: Insufficient documentation

## 2019-09-19 DIAGNOSIS — Z20822 Contact with and (suspected) exposure to covid-19: Secondary | ICD-10-CM

## 2019-09-19 NOTE — ED Notes (Signed)
Pt presents to ED via POV with 3 kids, pt states Covid exposure, states wants Covid testing for herself and her 3 children at this time.

## 2019-09-19 NOTE — ED Triage Notes (Signed)
Patient states that she was exposed to covid 2 days ago. Patient states that she became short of breath and started vomiting two days ago.

## 2019-09-19 NOTE — ED Provider Notes (Signed)
[facility] Emergency Department Provider Note   ____________________________________________    I have reviewed the triage vital signs and the nursing notes.   HISTORY  Chief Complaint Coronavirus exposure    HPI Melanie Whitaker is a 33 y.o. female who presents after recent exposure to coronavirus.  She reports that her exhusband's family has recently come down with a coronavirus and she was exposed to him as they spent the holidays together and their kids have both been exposed to him.  She reports she has developed a mild cough, denies body aches.  No fevers reported.  Did have some nausea now improved.  No loss of taste or smell  History reviewed. No pertinent past medical history.  Patient Active Problem List   Diagnosis Date Noted  . Acute bronchitis 12/08/2018    Past Surgical History:  Procedure Laterality Date  . APPENDECTOMY    . CHOLECYSTECTOMY    . TONSILLECTOMY    . TUBAL LIGATION      Prior to Admission medications   Medication Sig Start Date End Date Taking? Authorizing Provider  benzonatate (TESSALON) 100 MG capsule Take 1 capsule (100 mg total) by mouth 3 (three) times daily as needed for cough. 12/09/18   Fritzi Mandes, MD  citalopram (CELEXA) 10 MG tablet Take 10 mg by mouth at bedtime. 12/04/18   [provider]  oseltamivir (TAMIFLU) 75 MG capsule Take 1 capsule (75 mg total) by mouth 2 (two) times daily. 12/09/18   Fritzi Mandes, MD     Allergies Sulfa antibiotics, Penicillins, and Phenergan [promethazine hcl]  No family history on file.  Social History Social History   Tobacco Use  . Smoking status: Never Smoker  . Smokeless tobacco: Never Used  Substance Use Topics  . Alcohol use: No  . Drug use: Never    Review of Systems  Constitutional: No fever/chills  ENT: No sore throat.   Gastrointestinal: No abdominal pain. .   Genitourinary: Negative for dysuria. Musculoskeletal: Negative for back pain.  Skin: Negative for rash. Neurological: Negative for headaches     ____________________________________________   PHYSICAL EXAM:  VITAL SIGNS: ED Triage Vitals [09/19/19 2102]  Enc Vitals Group     BP 129/70     Pulse Rate (!) 102     Resp 18     Temp 99.7 F (37.6 C)     Temp Source Oral     SpO2 97 %     Weight 95.3 kg (210 lb)     Height 1.778 m (5\' 10" )     Head Circumference      Peak Flow      Pain Score 5     Pain Loc      Pain Edu?      Excl. in [place]?      Constitutional: Alert and oriented. No acute distress. Pleasant and interactive  Nose: No congestion/rhinnorhea. Mouth/Throat: Mucous membranes are moist.   Cardiovascular: Normal rate, regular rhythm.  Respiratory: Normal respiratory effort.  No retractions.  Clear to auscultation bilaterally Genitourinary: deferred Musculoskeletal: No lower extremity tenderness nor edema.   Neurologic:  Normal speech and language. No gross focal neurologic deficits are appreciated.   Skin:  Skin is warm, dry and intact. No rash noted.   ____________________________________________   LABS (all labs ordered are listed, but only abnormal results are displayed)  Labs Reviewed  SARS CORONAVIRUS 2 (TAT 6-24 HRS)   ____________________________________________  EKG   ____________________________________________  RADIOLOGY  ____________________________________________   PROCEDURES  Procedure(s) performed: No  Procedures   Critical Care performed: No ____________________________________________   INITIAL IMPRESSION / ASSESSMENT AND PLAN / ED COURSE  Pertinent labs & imaging results that were available during my care of the patient were reviewed by me and considered in my medical decision making (see chart for details).  Patient well-appearing and in no acute distress, vital signs notable for mildly elevated temperature, normal oxygenation and respiratory rate.  We will send Covid swab, contact cautions  discussed.  Melanie Whitaker was evaluated in Emergency Department on 09/19/2019 for the symptoms described in the history of present illness. She was evaluated in the context of the global COVID-19 pandemic, which necessitated consideration that the patient might be at risk for infection with the SARS-CoV-2 virus that causes COVID-19. Institutional protocols and algorithms that pertain to the evaluation of patients at risk for COVID-19 are in a state of rapid change based on information released by regulatory bodies including the CDC and federal and state organizations. These policies and algorithms were followed during the patient's care in the ED.    ____________________________________________   FINAL CLINICAL IMPRESSION(S) / ED DIAGNOSES  Final diagnoses:  Close exposure to COVID-19 virus      NEW MEDICATIONS STARTED DURING THIS VISIT:  Discharge Medication List as of 09/19/2019  9:24 PM       Note:  This document was prepared using Dragon voice recognition software and may include unintentional dictation errors.   Lavonia Drafts, MD 09/19/19 2320

## 2019-09-19 NOTE — ED Notes (Signed)
NAD noted at time of D/C. Pt denies questions or concerns. Pt ambulatory to the lobby at this time.  

## 2019-09-20 LAB — SARS CORONAVIRUS 2 (TAT 6-24 HRS): SARS Coronavirus 2: NEGATIVE

## 2020-08-12 ENCOUNTER — Other Ambulatory Visit: Payer: Self-pay

## 2020-08-12 ENCOUNTER — Encounter: Payer: Self-pay | Admitting: Emergency Medicine

## 2020-08-12 ENCOUNTER — Emergency Department: Payer: Self-pay

## 2020-08-12 ENCOUNTER — Emergency Department
Admission: EM | Admit: 2020-08-12 | Discharge: 2020-08-12 | Disposition: A | Discharge status: Home / Self Care | Payer: Self-pay | Attending: Emergency Medicine | Admitting: Emergency Medicine

## 2020-08-12 DIAGNOSIS — A084 Viral intestinal infection, unspecified: Secondary | ICD-10-CM | POA: Insufficient documentation

## 2020-08-12 DIAGNOSIS — Z20822 Contact with and (suspected) exposure to covid-19: Secondary | ICD-10-CM | POA: Insufficient documentation

## 2020-08-12 DIAGNOSIS — R519 Headache, unspecified: Secondary | ICD-10-CM | POA: Insufficient documentation

## 2020-08-12 DIAGNOSIS — R059 Cough, unspecified: Secondary | ICD-10-CM | POA: Insufficient documentation

## 2020-08-12 DIAGNOSIS — R439 Unspecified disturbances of smell and taste: Secondary | ICD-10-CM | POA: Insufficient documentation

## 2020-08-12 DIAGNOSIS — R0602 Shortness of breath: Secondary | ICD-10-CM | POA: Insufficient documentation

## 2020-08-12 LAB — URINALYSIS, COMPLETE (UACMP) WITH MICROSCOPIC
Bacteria, UA: NONE SEEN
Bilirubin Urine: NEGATIVE
Glucose, UA: NEGATIVE mg/dL
Hgb urine dipstick: NEGATIVE
Ketones, ur: NEGATIVE mg/dL
Leukocytes,Ua: NEGATIVE
Nitrite: NEGATIVE
Protein, ur: NEGATIVE mg/dL
Specific Gravity, Urine: 1.013 (ref 1.005–1.030)
pH: 6 (ref 5.0–8.0)

## 2020-08-12 LAB — CBC WITH DIFFERENTIAL/PLATELET
Abs Immature Granulocytes: 0.05 10*3/uL (ref 0.00–0.07)
Basophils Absolute: 0 10*3/uL (ref 0.0–0.1)
Basophils Relative: 0 %
Eosinophils Absolute: 0.4 10*3/uL (ref 0.0–0.5)
Eosinophils Relative: 3 %
HCT: 39.9 % (ref 36.0–46.0)
Hemoglobin: 13.4 g/dL (ref 12.0–15.0)
Immature Granulocytes: 1 %
Lymphocytes Relative: 24 %
Lymphs Abs: 2.6 10*3/uL (ref 0.7–4.0)
MCH: 28.9 pg (ref 26.0–34.0)
MCHC: 33.6 g/dL (ref 30.0–36.0)
MCV: 86.2 fL (ref 80.0–100.0)
Monocytes Absolute: 0.8 10*3/uL (ref 0.1–1.0)
Monocytes Relative: 8 %
Neutro Abs: 6.7 10*3/uL (ref 1.7–7.7)
Neutrophils Relative %: 64 %
Platelets: 288 10*3/uL (ref 150–400)
RBC: 4.63 MIL/uL (ref 3.87–5.11)
RDW: 13.4 % (ref 11.5–15.5)
WBC: 10.5 10*3/uL (ref 4.0–10.5)
nRBC: 0 % (ref 0.0–0.2)

## 2020-08-12 LAB — COMPREHENSIVE METABOLIC PANEL
ALT: 36 U/L (ref 0–44)
AST: 22 U/L (ref 15–41)
Albumin: 4.1 g/dL (ref 3.5–5.0)
Alkaline Phosphatase: 85 U/L (ref 38–126)
Anion gap: 9 (ref 5–15)
BUN: 12 mg/dL (ref 6–20)
CO2: 22 mmol/L (ref 22–32)
Calcium: 9.2 mg/dL (ref 8.9–10.3)
Chloride: 106 mmol/L (ref 98–111)
Creatinine, Ser: 0.57 mg/dL (ref 0.44–1.00)
GFR, Estimated: >60 mL/min (ref >60)
Glucose, Bld: 89 mg/dL (ref 70–99)
Potassium: 3.8 mmol/L (ref 3.5–5.1)
Sodium: 137 mmol/L (ref 135–145)
Total Bilirubin: 0.6 mg/dL (ref 0.3–1.2)
Total Protein: 7.8 g/dL (ref 6.5–8.1)

## 2020-08-12 LAB — RESPIRATORY PANEL BY RT PCR (FLU A&B, COVID)
Influenza A by PCR: NEGATIVE
Influenza B by PCR: NEGATIVE
SARS Coronavirus 2 by RT PCR: NEGATIVE

## 2020-08-12 LAB — LACTIC ACID, PLASMA: Lactic Acid, Venous: 1.1 mmol/L (ref 0.5–1.9)

## 2020-08-12 LAB — POCT PREGNANCY, URINE: Preg Test, Ur: NEGATIVE

## 2020-08-12 MED ORDER — PREDNISONE 20 MG PO TABS: 40.0000 mg | ORAL_TABLET | Freq: Every day | ORAL | 0 refills | Status: DC

## 2020-08-12 MED ORDER — LOPERAMIDE HCL 2 MG PO TABS: 4.0000 mg | ORAL_TABLET | Freq: Four times a day (QID) | ORAL | 0 refills | Status: DC | PRN

## 2020-08-12 MED ORDER — NAPROXEN 500 MG PO TABS: 500.0000 mg | ORAL_TABLET | Freq: Two times a day (BID) | ORAL | 0 refills | Status: DC

## 2020-08-12 MED ORDER — ONDANSETRON 4 MG PO TBDP: 4.0000 mg | ORAL_TABLET | Freq: Three times a day (TID) | ORAL | 0 refills | Status: DC | PRN

## 2020-08-12 MED ORDER — PREDNISONE 20 MG PO TABS
60.0000 mg | ORAL_TABLET | Freq: Once | ORAL | Status: AC
  Administered 2020-08-12: 60 mg via ORAL
  Filled 2020-08-12: qty 3

## 2020-08-12 MED ORDER — LOPERAMIDE HCL 2 MG PO CAPS
4.0000 mg | ORAL_CAPSULE | Freq: Once | ORAL | Status: AC
  Administered 2020-08-12: 4 mg via ORAL
  Filled 2020-08-12: qty 2

## 2020-08-12 MED ORDER — FAMOTIDINE 20 MG PO TABS: 20.0000 mg | ORAL_TABLET | Freq: Two times a day (BID) | ORAL | 0 refills | Status: DC

## 2020-08-12 MED ORDER — ONDANSETRON 4 MG PO TBDP
8.0000 mg | ORAL_TABLET | Freq: Once | ORAL | Status: AC
  Administered 2020-08-12: 8 mg via ORAL
  Filled 2020-08-12: qty 2

## 2020-08-12 NOTE — Discharge Instructions (Signed)
Your lab tests, chest xray, and covid test today were all okay.  Take medications as prescribed to control your symptoms and help maintain good hydration while this illness runs its course.

## 2020-08-12 NOTE — ED Notes (Signed)
Patient provided with iced water and graham crackers per request for PO challenge.

## 2020-08-12 NOTE — ED Notes (Addendum)
Attempted blood draw, unable to obtain labs.  Lab called to draw, spoke with Nevin Bloodgood who will send someone.

## 2020-08-12 NOTE — ED Triage Notes (Signed)
Pt arrived via POV with reports of vomtiing, diarrhea x 2 days, HA, and shortness of breath. Pt denies any COVID exposure and states she also has loss of taste and loss of smell.

## 2020-08-12 NOTE — ED Provider Notes (Signed)
[facility] Emergency Department Provider Note  ____________________________________________  Time seen: Approximately 8:21 PM  I have reviewed the triage vital signs and the nursing notes.   HISTORY  Chief Complaint COVID Sxs    HPI Melanie Whitaker is a 34 y.o. female with no significant past medical history who comes the ED complaining of a week of gradual onset symptoms including body aches, fatigue, nonproductive cough, generalized headache, shortness of breath, vomiting and diarrhea, loss of sense of taste and smell.  This has been persistent over the past week, gradually worsening.  Denies dyspnea on exertion.  No significant chest pain, no pleuritic pain.  No aggravating or alleviating factors.  Body ache pain is mild.      History reviewed. No pertinent past medical history.   Patient Active Problem List   Diagnosis Date Noted  . Acute bronchitis 12/08/2018     Past Surgical History:  Procedure Laterality Date  . APPENDECTOMY    . CHOLECYSTECTOMY    . TONSILLECTOMY    . TUBAL LIGATION       Prior to Admission medications   Medication Sig Start Date End Date Taking? Authorizing Provider  benzonatate (TESSALON) 100 MG capsule Take 1 capsule (100 mg total) by mouth 3 (three) times daily as needed for cough. 12/09/18   Fritzi Mandes, MD  citalopram (CELEXA) 10 MG tablet Take 10 mg by mouth at bedtime. 12/04/18   [provider]  famotidine (PEPCID) 20 MG tablet Take 1 tablet (20 mg total) by mouth 2 (two) times daily. 08/12/20   Carrie Mew, MD  loperamide (IMODIUM A-D) 2 MG tablet Take 2 tablets (4 mg total) by mouth 4 (four) times daily as needed for diarrhea or loose stools. 08/12/20   Carrie Mew, MD  naproxen (NAPROSYN) 500 MG tablet Take 1 tablet (500 mg total) by mouth 2 (two) times daily with a meal. 08/12/20   Carrie Mew, MD  ondansetron (ZOFRAN ODT) 4 MG disintegrating tablet Take 1 tablet (4 mg total) by mouth  every 8 (eight) hours as needed for nausea or vomiting. 08/12/20   Carrie Mew, MD  oseltamivir (TAMIFLU) 75 MG capsule Take 1 capsule (75 mg total) by mouth 2 (two) times daily. 12/09/18   Fritzi Mandes, MD  predniSONE (DELTASONE) 20 MG tablet Take 2 tablets (40 mg total) by mouth daily. 08/12/20   Carrie Mew, MD     Allergies Sulfa antibiotics, Penicillins, and Phenergan [promethazine hcl]   No family history on file.  Social History Social History   Tobacco Use  . Smoking status: Never Smoker  . Smokeless tobacco: Never Used  Substance Use Topics  . Alcohol use: No  . Drug use: Never    Review of Systems  Constitutional:   No fever or chills.  ENT:   Positive sore throat. No rhinorrhea. Cardiovascular:   No chest pain or syncope. Respiratory:   Positive shortness of breath and nonproductive cough. Gastrointestinal:   Negative for abdominal pain, positive vomiting and diarrhea.  Musculoskeletal:   Negative for focal pain or swelling All other systems reviewed and are negative except as documented above in ROS and HPI.  ____________________________________________   PHYSICAL EXAM:  VITAL SIGNS: ED Triage Vitals  Enc Vitals Group     BP 08/12/20 1744 132/81     Pulse Rate 08/12/20 1744 (!) 117     Resp 08/12/20 1744 (!) 22     Temp 08/12/20 1744 98.6 F (37 C)     Temp  Source 08/12/20 1744 Oral     SpO2 08/12/20 1744 99 %     Weight 08/12/20 1741 210 lb (95.3 kg)     Height 08/12/20 1741 5\' 10"  (1.778 m)     Head Circumference --      Peak Flow --      Pain Score --      Pain Loc --      Pain Edu? --      Excl. in [place]? --     Vital signs reviewed, nursing assessments reviewed.   Constitutional:   Alert and oriented. Non-toxic appearance. Eyes:   Conjunctivae are normal. EOMI. PERRL. ENT      Head:   Normocephalic and atraumatic.      Nose:   Normal.      Mouth/Throat: Moist mucosa, no pharyngeal erythema or swelling.      Neck:   No  meningismus. Full ROM. Hematological/Lymphatic/Immunilogical:   No cervical lymphadenopathy. Cardiovascular:   Tachycardia heart rate 110. Symmetric bilateral radial and DP pulses.  No murmurs. Cap refill less than 2 seconds. Respiratory:   Normal respiratory effort without tachypnea/retractions. Breath sounds are clear and equal bilaterally. No wheezes/rales/rhonchi. Gastrointestinal:   Soft with mild left upper quadrant tenderness. Non distended. There is no CVA tenderness.  No rebound, rigidity, or guarding.  Musculoskeletal:   Normal range of motion in all extremities. No joint effusions.  No lower extremity tenderness.  No edema. Neurologic:   Normal speech and language.  Motor grossly intact. No acute focal neurologic deficits are appreciated.  Skin:    Skin is warm, dry and intact. No rash noted.  No petechiae, purpura, or bullae.  ____________________________________________    LABS (pertinent positives/negatives) (all labs ordered are listed, but only abnormal results are displayed) Labs Reviewed  URINALYSIS, COMPLETE (UACMP) WITH MICROSCOPIC - Abnormal; Notable for the following components:      Result Value   Color, Urine YELLOW (*)    APPearance HAZY (*)    All other components within normal limits  RESPIRATORY PANEL BY RT PCR (FLU A&B, COVID)  LACTIC ACID, PLASMA  COMPREHENSIVE METABOLIC PANEL  CBC WITH DIFFERENTIAL/PLATELET  POC URINE PREG, ED  POCT PREGNANCY, URINE   ____________________________________________   EKG    ____________________________________________    RADIOLOGY  DG Chest 2 View  Result Date: 08/12/2020 CLINICAL DATA:  Shortness of breath with COVID symptoms. EXAM: CHEST - 2 VIEW COMPARISON:  Chest radiograph September 09, 2019 FINDINGS: The heart size and mediastinal contours are within normal limits. Both lungs are clear. No pleural effusions or pneumothorax. No acute osseous abnormality. IMPRESSION: No acute cardiopulmonary disease.  Electronically Signed   By: Margaretha Sheffield MD   On: 08/12/2020 18:59    ____________________________________________   PROCEDURES Procedures  ____________________________________________  DIFFERENTIAL DIAGNOSIS   Viral gastroenteritis, Covid, dehydration, electrolyte abnormality  CLINICAL IMPRESSION / ASSESSMENT AND PLAN / ED COURSE  Medications ordered in the ED: Medications  ondansetron (ZOFRAN-ODT) disintegrating tablet 8 mg (8 mg Oral Given 08/12/20 2026)  loperamide (IMODIUM) capsule 4 mg (4 mg Oral Given 08/12/20 2025)  predniSONE (DELTASONE) tablet 60 mg (60 mg Oral Given 08/12/20 2025)    Pertinent labs & imaging results that were available during my care of the patient were reviewed by me and considered in my medical decision making (see chart for details).  Melanie Whitaker was evaluated in Emergency Department on 08/12/2020 for the symptoms described in the history of present illness. She was evaluated in the context  of the global COVID-19 pandemic, which necessitated consideration that the patient might be at risk for infection with the SARS-CoV-2 virus that causes COVID-19. Institutional protocols and algorithms that pertain to the evaluation of patients at risk for COVID-19 are in a state of rapid change based on information released by regulatory bodies including the CDC and federal and state organizations. These policies and algorithms were followed during the patient's care in the ED.   Patient presents with multiple symptoms consistent with a viral syndrome.  She is mildly tachycardic which I think is due to dehydration.  Afebrile, normal blood pressure and oxygenation.  Exam is reassuring.  Lab panel is all normal.  Will treat symptomatically with Zofran Imodium and prednisone.  Will do p.o. trial.  Plan to start an acid therapy and NSAIDs as well.  Counseled that this is highly suspicious for Covid and she will need to quarantine at home pending results of our PCR  today.  Clinical Course as of Aug 12 2118  Sat Aug 12, 2020  2117 Covid negative, vital signs normalized, tolerating oral intake.  Stable for discharge home.   [PS]    Clinical Course User Index [PS] Carrie Mew, MD     ____________________________________________   FINAL CLINICAL IMPRESSION(S) / ED DIAGNOSES    Final diagnoses:  Viral gastroenteritis     ED Discharge Orders         Ordered    ondansetron (ZOFRAN ODT) 4 MG disintegrating tablet  Every 8 hours PRN        08/12/20 2118    loperamide (IMODIUM A-D) 2 MG tablet  4 times daily PRN        08/12/20 2118    famotidine (PEPCID) 20 MG tablet  2 times daily        08/12/20 2118    predniSONE (DELTASONE) 20 MG tablet  Daily        08/12/20 2118    naproxen (NAPROSYN) 500 MG tablet  2 times daily with meals        08/12/20 2118          Portions of this note were generated with dragon dictation software. Dictation errors may occur despite best attempts at proofreading.   Carrie Mew, MD 08/12/20 2119

## 2020-10-15 ENCOUNTER — Emergency Department
Admission: EM | Admit: 2020-10-15 | Discharge: 2020-10-15 | Disposition: A | Discharge status: Home / Self Care | Payer: HRSA Program | Attending: Emergency Medicine | Admitting: Emergency Medicine

## 2020-10-15 ENCOUNTER — Other Ambulatory Visit: Payer: Self-pay

## 2020-10-15 ENCOUNTER — Encounter: Payer: Self-pay | Admitting: Emergency Medicine

## 2020-10-15 ENCOUNTER — Emergency Department: Payer: HRSA Program

## 2020-10-15 DIAGNOSIS — R1110 Vomiting, unspecified: Secondary | ICD-10-CM | POA: Insufficient documentation

## 2020-10-15 DIAGNOSIS — U071 COVID-19: Secondary | ICD-10-CM | POA: Diagnosis not present

## 2020-10-15 DIAGNOSIS — R059 Cough, unspecified: Secondary | ICD-10-CM | POA: Diagnosis present

## 2020-10-15 LAB — URINALYSIS, COMPLETE (UACMP) WITH MICROSCOPIC
Bilirubin Urine: NEGATIVE
Glucose, UA: NEGATIVE mg/dL
Hgb urine dipstick: NEGATIVE
Ketones, ur: NEGATIVE mg/dL
Leukocytes,Ua: NEGATIVE
Nitrite: NEGATIVE
Protein, ur: NEGATIVE mg/dL
Specific Gravity, Urine: 1.028 (ref 1.005–1.030)
pH: 5 (ref 5.0–8.0)

## 2020-10-15 LAB — COMPREHENSIVE METABOLIC PANEL
ALT: 45 U/L — ABNORMAL HIGH (ref 0–44)
AST: 36 U/L (ref 15–41)
Albumin: 3.9 g/dL (ref 3.5–5.0)
Alkaline Phosphatase: 76 U/L (ref 38–126)
Anion gap: 7 (ref 5–15)
BUN: 12 mg/dL (ref 6–20)
CO2: 22 mmol/L (ref 22–32)
Calcium: 9.1 mg/dL (ref 8.9–10.3)
Chloride: 106 mmol/L (ref 98–111)
Creatinine, Ser: 0.68 mg/dL (ref 0.44–1.00)
GFR, Estimated: >60 mL/min (ref >60)
Glucose, Bld: 122 mg/dL — ABNORMAL HIGH (ref 70–99)
Potassium: 3.7 mmol/L (ref 3.5–5.1)
Sodium: 135 mmol/L (ref 135–145)
Total Bilirubin: 0.6 mg/dL (ref 0.3–1.2)
Total Protein: 8.2 g/dL — ABNORMAL HIGH (ref 6.5–8.1)

## 2020-10-15 LAB — CBC WITH DIFFERENTIAL/PLATELET
Abs Immature Granulocytes: 0.03 10*3/uL (ref 0.00–0.07)
Basophils Absolute: 0 10*3/uL (ref 0.0–0.1)
Basophils Relative: 1 %
Eosinophils Absolute: 0.1 10*3/uL (ref 0.0–0.5)
Eosinophils Relative: 2 %
HCT: 40.6 % (ref 36.0–46.0)
Hemoglobin: 13.7 g/dL (ref 12.0–15.0)
Immature Granulocytes: 1 %
Lymphocytes Relative: 34 %
Lymphs Abs: 1.7 10*3/uL (ref 0.7–4.0)
MCH: 29.4 pg (ref 26.0–34.0)
MCHC: 33.7 g/dL (ref 30.0–36.0)
MCV: 87.1 fL (ref 80.0–100.0)
Monocytes Absolute: 0.8 10*3/uL (ref 0.1–1.0)
Monocytes Relative: 16 %
Neutro Abs: 2.4 10*3/uL (ref 1.7–7.7)
Neutrophils Relative %: 46 %
Platelets: 275 10*3/uL (ref 150–400)
RBC: 4.66 MIL/uL (ref 3.87–5.11)
RDW: 13.5 % (ref 11.5–15.5)
WBC: 5.1 10*3/uL (ref 4.0–10.5)
nRBC: 0 % (ref 0.0–0.2)

## 2020-10-15 LAB — RESP PANEL BY RT-PCR (FLU A&B, COVID) ARPGX2
Influenza A by PCR: NEGATIVE
Influenza B by PCR: NEGATIVE
SARS Coronavirus 2 by RT PCR: POSITIVE — AB

## 2020-10-15 NOTE — Discharge Instructions (Signed)
Follow-up with your regular doctor as needed.  Return emergency department worsening. Your Covid test positive so you will need to quarantine for 14 days. Quarantine instructions are attached to your discharge papers.  Quarantine means you do not leave your house.  Someone else bring you food and supplies. Take over-the-counter Mucinex for cough, vitamin C to boost your immune system, zinc, and vitamin D

## 2020-10-15 NOTE — ED Notes (Signed)
POC Urine Preg NEGATIVE 

## 2020-10-15 NOTE — ED Provider Notes (Signed)
[facility] Emergency Department Provider Note  ____________________________________________   Event Date/Time   First MD Initiated Contact with Patient 10/15/20 1655     (approximate)  I have reviewed the triage vital signs and the nursing notes.   HISTORY  Chief Complaint Fever    HPI Melanie Whitaker is a 34 y.o. female presents emergency department complaining of vomiting and diarrhea, cough and body aches.  States she lost her sense of taste and smell this morning.  Patient's been in alcohol at home is feeling this way all week.  She is not vaccinated for Covid.  She did not get a flu vaccine.   History reviewed. No pertinent past medical history.  Patient Active Problem List   Diagnosis Date Noted  . Acute bronchitis 12/08/2018    Past Surgical History:  Procedure Laterality Date  . APPENDECTOMY    . CHOLECYSTECTOMY    . TONSILLECTOMY    . TUBAL LIGATION      Prior to Admission medications   Medication Sig Start Date End Date Taking? Authorizing Provider  benzonatate (TESSALON) 100 MG capsule Take 1 capsule (100 mg total) by mouth 3 (three) times daily as needed for cough. 12/09/18   Fritzi Mandes, MD  citalopram (CELEXA) 10 MG tablet Take 10 mg by mouth at bedtime. 12/04/18   [provider]  famotidine (PEPCID) 20 MG tablet Take 1 tablet (20 mg total) by mouth 2 (two) times daily. 08/12/20   Carrie Mew, MD  loperamide (IMODIUM A-D) 2 MG tablet Take 2 tablets (4 mg total) by mouth 4 (four) times daily as needed for diarrhea or loose stools. 08/12/20   Carrie Mew, MD  naproxen (NAPROSYN) 500 MG tablet Take 1 tablet (500 mg total) by mouth 2 (two) times daily with a meal. 08/12/20   Carrie Mew, MD  ondansetron (ZOFRAN ODT) 4 MG disintegrating tablet Take 1 tablet (4 mg total) by mouth every 8 (eight) hours as needed for nausea or vomiting. 08/12/20   Carrie Mew, MD  oseltamivir (TAMIFLU) 75 MG capsule Take 1  capsule (75 mg total) by mouth 2 (two) times daily. 12/09/18   Fritzi Mandes, MD  predniSONE (DELTASONE) 20 MG tablet Take 2 tablets (40 mg total) by mouth daily. 08/12/20   Carrie Mew, MD    Allergies Sulfa antibiotics, Penicillins, and Phenergan [promethazine hcl]  History reviewed. No pertinent family history.  Social History Social History   Tobacco Use  . Smoking status: Never Smoker  . Smokeless tobacco: Never Used  Substance Use Topics  . Alcohol use: No  . Drug use: Never    Review of Systems  Constitutional: Positive fever/chills Eyes: No visual changes. ENT: No sore throat. Respiratory: Positive cough Cardiovascular: Denies chest pain Gastrointestinal: Positive diarrhea and vomiting, no abdominal pain Genitourinary: Negative for dysuria. Musculoskeletal: Negative for back pain. Skin: Negative for rash. Psychiatric: no mood changes,     ____________________________________________   PHYSICAL EXAM:  VITAL SIGNS: ED Triage Vitals  Enc Vitals Group     BP 10/15/20 1357 119/73     Pulse Rate 10/15/20 1357 (!) 113     Resp 10/15/20 1357 18     Temp 10/15/20 1357 99.2 F (37.3 C)     Temp Source 10/15/20 1357 Oral     SpO2 10/15/20 1357 96 %     Weight 10/15/20 1356 210 lb (95.3 kg)     Height 10/15/20 1356 5\' 10"  (1.778 m)     Head Circumference --  Peak Flow --      Pain Score 10/15/20 1355 7     Pain Loc --      Pain Edu? --      Excl. in [place]? --     Constitutional: Alert and oriented. Well appearing and in no acute distress. Eyes: Conjunctivae are normal.  Head: Atraumatic. Nose: No congestion/rhinnorhea. Mouth/Throat: Mucous membranes are moist.  Neck:  supple no lymphadenopathy noted Cardiovascular: Normal rate, regular rhythm. Heart sounds are normal Respiratory: Normal respiratory effort.  No retractions, lungs c t a  Abd: soft nontender bs normal all 4 quad GU: deferred Musculoskeletal: FROM all extremities, warm and well  perfused Neurologic:  Normal speech and language.  Skin:  Skin is warm, dry and intact. No rash noted. Psychiatric: Mood and affect are normal. Speech and behavior are normal.  ____________________________________________   LABS (all labs ordered are listed, but only abnormal results are displayed)  Labs Reviewed  RESP PANEL BY RT-PCR (FLU A&B, COVID) ARPGX2 - Abnormal; Notable for the following components:      Result Value   SARS Coronavirus 2 by RT PCR POSITIVE (*)    All other components within normal limits  COMPREHENSIVE METABOLIC PANEL - Abnormal; Notable for the following components:   Glucose, Bld 122 (*)    Total Protein 8.2 (*)    ALT 45 (*)    All other components within normal limits  URINALYSIS, COMPLETE (UACMP) WITH MICROSCOPIC - Abnormal; Notable for the following components:   Color, Urine YELLOW (*)    APPearance HAZY (*)    Bacteria, UA RARE (*)    All other components within normal limits  CBC WITH DIFFERENTIAL/PLATELET  POC URINE PREG, ED   ____________________________________________   ____________________________________________  RADIOLOGY    ____________________________________________   PROCEDURES  Procedure(s) performed: No  Procedures    ____________________________________________   INITIAL IMPRESSION / ASSESSMENT AND PLAN / ED COURSE  Pertinent labs & imaging results that were available during my care of the patient were reviewed by me and considered in my medical decision making (see chart for details).   Patient is 34 year old unvaccinated for Covid patient presents with Covid-like symptoms.  See HPI.  Physical exam is unremarkable.  DDx: Covid, influenza, gastroenteritis, pregnancy  CBC is normal, comprehensive metabolic panel is normal, urinalysis is normal, POC pregnancy is negative, and chest x-ray which was reviewed by me appears to be normal and confirmed by radiology.  Respiratory panel   Respiratory panel shows  positive for Covid  Explained the findings to the patient.  She is to quarantine for 14 days.  Take over-the-counter medications.  Return if worsening.  She is discharged stable condition.  Melanie Whitaker was evaluated in Emergency Department on 10/15/2020 for the symptoms described in the history of present illness. She was evaluated in the context of the global COVID-19 pandemic, which necessitated consideration that the patient might be at risk for infection with the SARS-CoV-2 virus that causes COVID-19. Institutional protocols and algorithms that pertain to the evaluation of patients at risk for COVID-19 are in a state of rapid change based on information released by regulatory bodies including the CDC and federal and state organizations. These policies and algorithms were followed during the patient's care in the ED.    As part of my medical decision making, I reviewed the following data within the [place] notes reviewed and incorporated, Labs reviewed , Old chart reviewed, Radiograph reviewed , Notes from prior ED visits  and [place] Controlled Substance Database  ____________________________________________   FINAL CLINICAL IMPRESSION(S) / ED DIAGNOSES  Final diagnoses:  COVID-19      NEW MEDICATIONS STARTED DURING THIS VISIT:  New Prescriptions   No medications on file     Note:  This document was prepared using Dragon voice recognition software and may include unintentional dictation errors.    Versie Starks, PA-C 10/15/20 Mickey Farber, MD 10/15/20 [phone]

## 2020-10-15 NOTE — ED Notes (Signed)
Pt reports diarrhea (every 23min to 1 hour) and vomiting (x4 in 24 hours) x4 days Pt reports dizziness and SHOB with all exertion Pt reports this am that she lost sense of smell and taste

## 2020-10-15 NOTE — ED Triage Notes (Signed)
Pt to triage with c/o cough, fever and body aches, chills for last several days.  Pt states cough is nonproductive.

## 2020-10-16 ENCOUNTER — Other Ambulatory Visit: Payer: Self-pay | Admitting: Internal Medicine

## 2020-10-16 DIAGNOSIS — U071 COVID-19: Secondary | ICD-10-CM

## 2020-10-16 DIAGNOSIS — Z6830 Body mass index (BMI) 30.0-30.9, adult: Secondary | ICD-10-CM

## 2020-10-16 NOTE — Progress Notes (Signed)
I connected by phone with Melanie Whitaker on 10/16/2020 at 4:04 PM to discuss the potential use of a new treatment for mild to moderate COVID-19 viral infection in non-hospitalized patients.  This patient is a 34 y.o. female that meets the FDA criteria for Emergency Use Authorization of COVID monoclonal antibody casirivimab/imdevimab, bamlanivimab/etesevimab, or sotrovimab.  Has a (+) direct SARS-CoV-2 viral test result  Has mild or moderate COVID-19   Is NOT hospitalized due to COVID-19  Is within 10 days of symptom onset  Has at least one of the high risk factor(s) for progression to severe COVID-19 and/or hospitalization as defined in EUA.  Specific high risk criteria : BMI > 25   I have spoken and communicated the following to the patient or parent/caregiver regarding COVID monoclonal antibody treatment:  1. FDA has authorized the emergency use for the treatment of mild to moderate COVID-19 in adults and pediatric patients with positive results of direct SARS-CoV-2 viral testing who are 86 years of age and older weighing at least 40 kg, and who are at high risk for progressing to severe COVID-19 and/or hospitalization.  2. The significant known and potential risks and benefits of COVID monoclonal antibody, and the extent to which such potential risks and benefits are unknown.  3. Information on available alternative treatments and the risks and benefits of those alternatives, including clinical trials.  4. Patients treated with COVID monoclonal antibody should continue to self-isolate and use infection control measures (e.g., wear mask, isolate, social distance, avoid sharing personal items, clean and disinfect "high touch" surfaces, and frequent handwashing) according to CDC guidelines.   5. The patient or parent/caregiver has the option to accept or refuse COVID monoclonal antibody treatment.  After reviewing this information with the patient, the patient has agreed to receive one  of the available covid 19 monoclonal antibodies and will be provided an appropriate fact sheet prior to infusion.   Mab infusion scheduled for 12/28 at 1030. Sx onset 12/23.   Cyndee Brightly, NP [facility]

## 2020-10-17 ENCOUNTER — Ambulatory Visit (HOSPITAL_COMMUNITY)
Admission: RE | Admit: 2020-10-17 | Discharge: 2020-10-17 | Disposition: A | Discharge status: Home / Self Care | Payer: HRSA Program | Source: Ambulatory Visit | Attending: Pulmonary Disease | Admitting: Pulmonary Disease

## 2020-10-17 DIAGNOSIS — U071 COVID-19: Secondary | ICD-10-CM | POA: Insufficient documentation

## 2020-10-17 DIAGNOSIS — Z6830 Body mass index (BMI) 30.0-30.9, adult: Secondary | ICD-10-CM | POA: Insufficient documentation

## 2020-10-17 DIAGNOSIS — E669 Obesity, unspecified: Secondary | ICD-10-CM | POA: Insufficient documentation

## 2020-10-17 MED ORDER — ALBUTEROL SULFATE HFA 108 (90 BASE) MCG/ACT IN AERS: 2.0000 | INHALATION_SPRAY | Freq: Once | RESPIRATORY_TRACT | Status: DC | PRN

## 2020-10-17 MED ORDER — SODIUM CHLORIDE 0.9 % IV SOLN: Freq: Once | INTRAVENOUS | Status: AC

## 2020-10-17 MED ORDER — SODIUM CHLORIDE 0.9 % IV SOLN: INTRAVENOUS | Status: DC | PRN

## 2020-10-17 MED ORDER — EPINEPHRINE 0.3 MG/0.3ML IJ SOAJ: 0.3000 mg | Freq: Once | INTRAMUSCULAR | Status: DC | PRN

## 2020-10-17 MED ORDER — METHYLPREDNISOLONE SODIUM SUCC 125 MG IJ SOLR: 125.0000 mg | Freq: Once | INTRAMUSCULAR | Status: DC | PRN

## 2020-10-17 MED ORDER — FAMOTIDINE IN NACL 20-0.9 MG/50ML-% IV SOLN: 20.0000 mg | Freq: Once | INTRAVENOUS | Status: DC | PRN

## 2020-10-17 MED ORDER — DIPHENHYDRAMINE HCL 50 MG/ML IJ SOLN: 50.0000 mg | Freq: Once | INTRAMUSCULAR | Status: DC | PRN

## 2020-10-17 NOTE — Progress Notes (Signed)
  Diagnosis: COVID-19  Physician: Dr. Wright   Procedure: Covid Infusion Clinic Med: casirivimab\imdevimab infusion - Provided patient with casirivimab\imdevimab fact sheet for patients, parents and caregivers prior to infusion.  Complications: No immediate complications noted.  Discharge: Discharged home   Melanie Whitaker  [name] 10/17/2020   

## 2020-10-17 NOTE — Discharge Instructions (Signed)
10 Things You Can Do to Manage Your COVID-19 Symptoms at Home If you have possible or confirmed COVID-19: 1. Stay home from work and school. And stay away from other public places. If you must go out, avoid using any kind of public transportation, ridesharing, or taxis. 2. Monitor your symptoms carefully. If your symptoms get worse, call your healthcare provider immediately. 3. Get rest and stay hydrated. 4. If you have a medical appointment, call the healthcare provider ahead of time and tell them that you have or may have COVID-19. 5. For medical emergencies, call 911 and notify the dispatch personnel that you have or may have COVID-19. 6. Cover your cough and sneezes with a tissue or use the inside of your elbow. 7. Wash your hands often with soap and water for at least 20 seconds or clean your hands with an alcohol-based hand sanitizer that contains at least 60% alcohol. 8. As much as possible, stay in a specific room and away from other people in your home. Also, you should use a separate bathroom, if available. If you need to be around other people in or outside of the home, wear a mask. 9. Avoid sharing personal items with other people in your household, like dishes, towels, and bedding. 10. Clean all surfaces that are touched often, like counters, tabletops, and doorknobs. Use household cleaning sprays or wipes according to the label instructions. cdc.gov/coronavirus 04/21/2019 This information is not intended to replace advice given to you by your health care provider. Make sure you discuss any questions you have with your health care provider. Document Revised: 09/23/2019 Document Reviewed: 09/23/2019 Elsevier Patient Education  2020 Elsevier Inc. What types of side effects do monoclonal antibody drugs cause?  Common side effects  In general, the more common side effects caused by monoclonal antibody drugs include: . Allergic reactions, such as hives or itching . Flu-like signs and  symptoms, including chills, fatigue, fever, and muscle aches and pains . Nausea, vomiting . Diarrhea . Skin rashes . Low blood pressure   The CDC is recommending patients who receive monoclonal antibody treatments wait at least 90 days before being vaccinated.  Currently, there are no data on the safety and efficacy of mRNA COVID-19 vaccines in persons who received monoclonal antibodies or convalescent plasma as part of COVID-19 treatment. Based on the estimated half-life of such therapies as well as evidence suggesting that reinfection is uncommon in the 90 days after initial infection, vaccination should be deferred for at least 90 days, as a precautionary measure until additional information becomes available, to avoid interference of the antibody treatment with vaccine-induced immune responses. If you have any questions or concerns after the infusion please call the Advanced Practice Provider on call at [phone]. This number is ONLY intended for your use regarding questions or concerns about the infusion post-treatment side-effects.  Please do not provide this number to others for use. For return to work notes please contact your primary care provider.   If someone you know is interested in receiving treatment please have them call the COVID hotline at [phone].   

## 2020-10-17 NOTE — Progress Notes (Signed)
Patient reviewed Fact Sheet for Patients, Parents, and Caregivers for Emergency Use Authorization (EUA) of Casirivimab and imdevimab for the Treatment of Coronavirus. Patient also reviewed and is agreeable to the estimated cost of treatment. Patient is agreeable to proceed.   

## 2020-10-29 ENCOUNTER — Emergency Department
Admission: EM | Admit: 2020-10-29 | Discharge: 2020-10-30 | Disposition: A | Discharge status: Home / Self Care | Payer: HRSA Program | Attending: Emergency Medicine | Admitting: Emergency Medicine

## 2020-10-29 ENCOUNTER — Other Ambulatory Visit: Payer: Self-pay

## 2020-10-29 ENCOUNTER — Emergency Department: Payer: HRSA Program

## 2020-10-29 ENCOUNTER — Encounter: Payer: Self-pay | Admitting: Radiology

## 2020-10-29 DIAGNOSIS — U071 COVID-19: Secondary | ICD-10-CM | POA: Diagnosis not present

## 2020-10-29 DIAGNOSIS — R0602 Shortness of breath: Secondary | ICD-10-CM | POA: Diagnosis present

## 2020-10-29 LAB — CBC
HCT: 42 % (ref 36.0–46.0)
Hemoglobin: 13.9 g/dL (ref 12.0–15.0)
MCH: 28.4 pg (ref 26.0–34.0)
MCHC: 33.1 g/dL (ref 30.0–36.0)
MCV: 85.9 fL (ref 80.0–100.0)
Platelets: 323 10*3/uL (ref 150–400)
RBC: 4.89 MIL/uL (ref 3.87–5.11)
RDW: 13.1 % (ref 11.5–15.5)
WBC: 5.3 10*3/uL (ref 4.0–10.5)
nRBC: 0 % (ref 0.0–0.2)

## 2020-10-29 LAB — BASIC METABOLIC PANEL
Anion gap: 10 (ref 5–15)
BUN: 12 mg/dL (ref 6–20)
CO2: 25 mmol/L (ref 22–32)
Calcium: 9.5 mg/dL (ref 8.9–10.3)
Chloride: 105 mmol/L (ref 98–111)
Creatinine, Ser: 0.77 mg/dL (ref 0.44–1.00)
GFR, Estimated: >60 mL/min (ref >60)
Glucose, Bld: 91 mg/dL (ref 70–99)
Potassium: 3.5 mmol/L (ref 3.5–5.1)
Sodium: 140 mmol/L (ref 135–145)

## 2020-10-29 LAB — TROPONIN I (HIGH SENSITIVITY): Troponin I (High Sensitivity): <2 ng/L (ref <18)

## 2020-10-29 NOTE — ED Triage Notes (Signed)
Patient reports she tested positive for COVID on 10/15/20.  Reports she was feeling better but over the past 2-3 days is having more shortness of breath, especially with exertion.

## 2020-10-30 ENCOUNTER — Encounter: Payer: Self-pay | Admitting: Radiology

## 2020-10-30 ENCOUNTER — Emergency Department: Payer: HRSA Program

## 2020-10-30 LAB — TROPONIN I (HIGH SENSITIVITY): Troponin I (High Sensitivity): <2 ng/L (ref <18)

## 2020-10-30 MED ORDER — LACTATED RINGERS IV BOLUS
1000.0000 mL | Freq: Once | INTRAVENOUS | Status: AC
  Administered 2020-10-30: 1000 mL via INTRAVENOUS

## 2020-10-30 MED ORDER — ONDANSETRON 4 MG PO TBDP: 4.0000 mg | ORAL_TABLET | Freq: Three times a day (TID) | ORAL | 0 refills | Status: DC | PRN

## 2020-10-30 MED ORDER — ALBUTEROL SULFATE HFA 108 (90 BASE) MCG/ACT IN AERS: 2.0000 | INHALATION_SPRAY | Freq: Four times a day (QID) | RESPIRATORY_TRACT | 2 refills | Status: DC | PRN

## 2020-10-30 MED ORDER — ONDANSETRON HCL 4 MG/2ML IJ SOLN
4.0000 mg | Freq: Once | INTRAMUSCULAR | Status: AC
  Administered 2020-10-30: 4 mg via INTRAVENOUS
  Filled 2020-10-30: qty 2

## 2020-10-30 MED ORDER — IOHEXOL 350 MG/ML SOLN
100.0000 mL | Freq: Once | INTRAVENOUS | Status: AC | PRN
  Administered 2020-10-30: 100 mL via INTRAVENOUS

## 2020-10-30 NOTE — ED Provider Notes (Signed)
[facility] Emergency Department Provider Note  ____________________________________________  Time seen: Approximately 3:07 AM  I have reviewed the triage vital signs and the nursing notes.   HISTORY  Chief Complaint Shortness of Breath   HPI Melanie Whitaker is a 35 y.o. female with no significant past medical history who presents for evaluation of chest pain and shortness of breath.  Patient was diagnosed with COVID 2 weeks ago.  She reports that she felt better but started to feel worse over the last 2 to 3 days.  She is complaining of dyspnea with exertion, chest tightness, and palpitations.  She started to have a fever again today.  Has had some diarrhea, nausea, vomiting.  No abdominal pain.  She is not vaccinated against COVID.  No personal or family history of blood clots, no recent travel immobilization, no leg pain or swelling, no hemoptysis or exogenous hormones.   History reviewed. No pertinent past medical history.  Patient Active Problem List   Diagnosis Date Noted  . Acute bronchitis 12/08/2018    Past Surgical History:  Procedure Laterality Date  . APPENDECTOMY    . CHOLECYSTECTOMY    . TONSILLECTOMY    . TUBAL LIGATION      Prior to Admission medications   Medication Sig Start Date End Date Taking? Authorizing Provider  albuterol (VENTOLIN HFA) 108 (90 Base) MCG/ACT inhaler Inhale 2 puffs into the lungs every 6 (six) hours as needed for wheezing or shortness of breath. 10/30/20  Yes Alfred Levins, [place], MD  ondansetron (ZOFRAN ODT) 4 MG disintegrating tablet Take 1 tablet (4 mg total) by mouth every 8 (eight) hours as needed. 10/30/20  Yes Alfred Levins, [place], MD  benzonatate (TESSALON) 100 MG capsule Take 1 capsule (100 mg total) by mouth 3 (three) times daily as needed for cough. 12/09/18   Fritzi Mandes, MD  citalopram (CELEXA) 10 MG tablet Take 10 mg by mouth at bedtime. 12/04/18   [provider]  famotidine (PEPCID) 20 MG tablet  Take 1 tablet (20 mg total) by mouth 2 (two) times daily. 08/12/20   Carrie Mew, MD  loperamide (IMODIUM A-D) 2 MG tablet Take 2 tablets (4 mg total) by mouth 4 (four) times daily as needed for diarrhea or loose stools. 08/12/20   Carrie Mew, MD  naproxen (NAPROSYN) 500 MG tablet Take 1 tablet (500 mg total) by mouth 2 (two) times daily with a meal. 08/12/20   Carrie Mew, MD  ondansetron (ZOFRAN ODT) 4 MG disintegrating tablet Take 1 tablet (4 mg total) by mouth every 8 (eight) hours as needed for nausea or vomiting. 08/12/20   Carrie Mew, MD  oseltamivir (TAMIFLU) 75 MG capsule Take 1 capsule (75 mg total) by mouth 2 (two) times daily. 12/09/18   Fritzi Mandes, MD  predniSONE (DELTASONE) 20 MG tablet Take 2 tablets (40 mg total) by mouth daily. 08/12/20   Carrie Mew, MD    Allergies Sulfa antibiotics, Penicillins, and Phenergan [promethazine hcl]  No family history on file.  Social History Social History   Tobacco Use  . Smoking status: Never Smoker  . Smokeless tobacco: Never Used  Substance Use Topics  . Alcohol use: No  . Drug use: Never    Review of Systems  Constitutional: + fever. Eyes: Negative for visual changes. ENT: Negative for sore throat. Neck: No neck pain  Cardiovascular: Negative for chest pain. + Chest tightness and palpitation Respiratory: Negative for shortness of breath. + DOE Gastrointestinal: Negative for abdominal pain. + vomiting  and diarrhea. Genitourinary: Negative for dysuria. Musculoskeletal: Negative for back pain. Skin: Negative for rash. Neurological: Negative for headaches, weakness or numbness. Psych: No SI or HI  ____________________________________________   PHYSICAL EXAM:  VITAL SIGNS: Vitals:   10/30/20 0040 10/30/20 0254  BP: (!) 137/94 110/86  Pulse: 90 76  Resp:  20  Temp:    SpO2:  100%    Constitutional: Alert and oriented. Well appearing and in no apparent distress. HEENT:      Head:  Normocephalic and atraumatic.         Eyes: Conjunctivae are normal. Sclera is non-icteric.       Mouth/Throat: Mucous membranes are moist.       Neck: Supple with no signs of meningismus. Cardiovascular: Regular rate and rhythm. No murmurs, gallops, or rubs. 2+ symmetrical distal pulses are present in all extremities. No JVD. Respiratory: Normal respiratory effort. Lungs are clear to auscultation bilaterally. No wheezes, crackles, or rhonchi.  Gastrointestinal: Soft, non tender, and non distended. Musculoskeletal:  No edema, cyanosis, or erythema of extremities. Neurologic: Normal speech and language. Face is symmetric. Moving all extremities. No gross focal neurologic deficits are appreciated. Skin: Skin is warm, dry and intact. No rash noted. Psychiatric: Mood and affect are normal. Speech and behavior are normal.  ____________________________________________   LABS (all labs ordered are listed, but only abnormal results are displayed)  Labs Reviewed  BASIC METABOLIC PANEL  CBC  POC URINE PREG, ED  TROPONIN I (HIGH SENSITIVITY)  TROPONIN I (HIGH SENSITIVITY)   ____________________________________________  EKG  ED ECG REPORT I, Rudene Re, the attending physician, personally viewed and interpreted this ECG.  Sinus tachycardia, rate of 108, normal intervals, right axis deviation, no ST elevations or depressions, S1Q3T3. Unchanged from prior from 08/2019 ____________________________________________  RADIOLOGY  I have personally reviewed the images performed during this visit and I agree with the Radiologist's read.   Interpretation by Radiologist:  DG Chest 2 View  Result Date: 10/29/2020 CLINICAL DATA:  35 year old female with chest pain. EXAM: CHEST - 2 VIEW COMPARISON:  Chest radiograph dated 10/15/2020. FINDINGS: The heart size and mediastinal contours are within normal limits. Both lungs are clear. The visualized skeletal structures are unremarkable. IMPRESSION:  No active cardiopulmonary disease. Electronically Signed   By: Anner Crete M.D.   On: 10/29/2020 22:47   CT Angio Chest PE W and/or Wo Contrast  Result Date: 10/30/2020 CLINICAL DATA:  Recent COVID positive test. Increasing shortness of breath. EXAM: CT ANGIOGRAPHY CHEST WITH CONTRAST TECHNIQUE: Multidetector CT imaging of the chest was performed using the standard protocol during bolus administration of intravenous contrast. Multiplanar CT image reconstructions and MIPs were obtained to evaluate the vascular anatomy. CONTRAST:  160mL OMNIPAQUE IOHEXOL 350 MG/ML SOLN COMPARISON:  None. FINDINGS: Cardiovascular: Contrast injection is sufficient to demonstrate satisfactory opacification of the pulmonary arteries to the segmental level. There is no pulmonary embolus or evidence of right heart strain. The size of the main pulmonary artery is normal. Heart size is normal, with no pericardial effusion. The course and caliber of the aorta are normal. There is no atherosclerotic calcification. Opacification decreased due to pulmonary arterial phase contrast bolus timing. Mediastinum/Nodes: -- No mediastinal lymphadenopathy. -- No hilar lymphadenopathy. -- No axillary lymphadenopathy. -- No supraclavicular lymphadenopathy. -- Normal thyroid gland where visualized. -  Unremarkable esophagus. Lungs/Pleura: There are few scattered ground-glass airspace opacities bilaterally. No pneumothorax or large pleural effusion. The trachea is unremarkable. Upper Abdomen: Contrast bolus timing is not optimized for evaluation of the  abdominal organs. There is probable hepatic steatosis. The patient is status post prior cholecystectomy. Musculoskeletal: No chest wall abnormality. No bony spinal canal stenosis. Review of the MIP images confirms the above findings. IMPRESSION: 1. No acute pulmonary embolism. 2. Scattered ground-glass airspace opacities bilaterally, which can be seen in patients with an atypical infectious process such  as viral pneumonia. 3. Probable hepatic steatosis. Electronically Signed   By: Katherine Mantle M.D.   On: 10/30/2020 03:53     ____________________________________________   PROCEDURES  Procedure(s) performed:yes .1-3 Lead EKG Interpretation Performed by: Nita Sickle, MD Authorized by: Nita Sickle, MD     Interpretation: non-specific     ECG rate assessment: normal     Rhythm: sinus rhythm     Ectopy: none     Critical Care performed: yes  CRITICAL CARE Performed by: Nita Sickle  ?  Total critical care time: 30 min  Critical care time was exclusive of separately billable procedures and treating other patients.  Critical care was necessary to treat or prevent imminent or life-threatening deterioration.  Critical care was time spent personally by me on the following activities: development of treatment plan with patient and/or surrogate as well as nursing, discussions with consultants, evaluation of patient's response to treatment, examination of patient, obtaining history from patient or surrogate, ordering and performing treatments and interventions, ordering and review of laboratory studies, ordering and review of radiographic studies, pulse oximetry and re-evaluation of patient's condition.  ____________________________________________   INITIAL IMPRESSION / ASSESSMENT AND PLAN / ED COURSE   34 y.o. female with no significant past medical history who presents for evaluation of chest tightness, DOE, fever, vomiting and diarrhea after being diagnosed with covid 2 weeks ago.  Patient is well-appearing in no distress initially extremely tachycardic but that resolved without intervention, normal work of breathing and normal sats both at rest and with ambulation, lungs are clear to auscultation with no wheezing or crackles.  Chest x-ray visualized by me with no acute findings, confirmed by radiology.  Blood work with no signs of leukocytosis, dehydration,  AKI, electrolyte derangements, anemia, or cardiac ischemia/ myocarditis.  Since patient was tachycardic and is complaining of chest pain and shortness of breath in the setting of COVID infection we will get a CT angio of the chest to rule out a PE.  Will give IV fluids and Zofran since patient has had vomiting and diarrhea and was initially tachycardic for possible mild dehydration.  Will monitor patient on telemetry.  Old medical records reviewed.  Ddx covid, pericarditis, myocarditis, PE, PNA.    _________________________ 4:00 AM on 10/30/2020 -----------------------------------------  CTA negative for PE.  There are some findings of COVID-pneumonia on the lung fields, visualized by me and confirmed by radiology.  Patient remains with normal work of breathing and normal sats with no hypoxia both at rest and with ambulation.  Discussed home care, post COVID follow-up, oxygen monitoring, and quarantine at home.  Will provide with an albuterol inhaler for these episodes of dyspnea on exertion.  Discussed my standard return precautions  _____________________________________________ Please note:  Patient was evaluated in Emergency Department today for the symptoms described in the history of present illness. Patient was evaluated in the context of the global COVID-19 pandemic, which necessitated consideration that the patient might be at risk for infection with the SARS-CoV-2 virus that causes COVID-19. Institutional protocols and algorithms that pertain to the evaluation of patients at risk for COVID-19 are in a state of rapid change based on information  released by regulatory bodies including the CDC and federal and state organizations. These policies and algorithms were followed during the patient's care in the ED.  Some ED evaluations and interventions may be delayed as a result of limited staffing during the pandemic.   [place] Controlled Substance Database was reviewed by  me. ____________________________________________   FINAL CLINICAL IMPRESSION(S) / ED DIAGNOSES   Final diagnoses:  COVID-19      NEW MEDICATIONS STARTED DURING THIS VISIT:  ED Discharge Orders         Ordered    albuterol (VENTOLIN HFA) 108 (90 Base) MCG/ACT inhaler  Every 6 hours PRN        10/30/20 0400    ondansetron (ZOFRAN ODT) 4 MG disintegrating tablet  Every 8 hours PRN        10/30/20 0400           Note:  This document was prepared using Dragon voice recognition software and may include unintentional dictation errors.    Alfred Levins, [place], MD 10/30/20 [phone]

## 2020-10-30 NOTE — Discharge Instructions (Addendum)
Post- COVID Clinic  [phone]  To help boost your immune system against COVID-19, please take:  - Vitamin D3 4,000 IU/day - Vitamin C 500-1,000?mg twice a day - Quercetin 250?mg twice a day - Zinc 100?mg/day - Melatonin 10?mg before bedtime (causes drowsiness) - Aspirin 325?mg/day (unless contraindicated) - Pulse Oximeter Monitoring of oxygen saturation is recommended - check your oxygen 3 times a day if less than 90% return to the ER  These medications are all over-the-counter and do not need a prescription.   QUARANTINE INSTRUCTION  Follow these instructions at home:  Protecting others To avoid spreading the illness to other people: Quarantine in your home until you have had no cough and fever for 7 days. Household members should also be quarantine for at least 14 days after being exposed to you. Wash your hands often with soap and water. If soap and water are not available, use an alcohol-based hand sanitizer. If you have not cleaned your hands, do not touch your face. Make sure that all people in your household wash their hands well and often. Cover your nose and mouth when you cough or sneeze. Throw away used tissues. Stay home if you have any cold-like or flu-like symptoms. General instructions Go to your local pharmacy and buy a pulse oximeter (this is a machine that measures your oxygen). Check your oxygen levels at least 3 times a day. If your oxygen level is 90% or less return to the emergency room immediately Take over-the-counter and prescription medicines only as told by your health care provider. If you need medication for fever take tylenol or ibuprofen Drink enough fluid to keep your urine pale yellow. Rest at home as directed by your health care provider. Do not give aspirin to a child with the flu, because of the association with Reye's syndrome. Do not use tobacco products, including cigarettes, chewing tobacco, and e-cigarettes. If you need help quitting,  ask your health care provider. Keep all follow-up visits as told by your health care provider. This is important. How is this prevented? Avoid areas where an outbreak has been reported. Avoid large groups of people. Keep a safe distance from people who are coughing and sneezing. Do not touch your face if you have not cleaned your hands. When you are around people who are sick or might be sick, wear a mask to protect yourself. Contact a health care provider if: You have symptoms of SARS (cough, fever, chest pain, shortness of breath) that are not getting better at home. You have a fever. If you have difficulty breathing go to your local ER or call 911   

## 2020-11-30 ENCOUNTER — Ambulatory Visit: Payer: Self-pay | Admitting: *Deleted

## 2020-11-30 NOTE — Telephone Encounter (Signed)
Patient calling to ask how long is it, since getting the monoclonal antibody infusion, can she get covid. Patient states she found out today a Art therapist tested positive for covid and she is not experiencing any symptoms at this time. Patient reports receiving the infusion in Dec, 26 , 2021. Patient has not been vaccinated. Instructed patient to get covid tested again and to notify her PCP. Patient does not have a PCP. Reviewed with patient she can get covid again and NT unable to verify how long after getting the infusion she can get covid. reviewed symptoms to watch for and to be tested within 5 days of exposure. Patient verbalized understanding of information and to call back and get tested for covid.   Reason for Disposition . [1] Caller requesting NON-URGENT health information AND [2] PCP's office is the best resource  Answer Assessment - Initial Assessment Questions 1. REASON FOR CALL or QUESTION: "What is your reason for calling today?" or "How can I best help you?" or "What question do you have that I can help answer?"     Patient asking how long after receiving the monoclonal antibody infusion can she get covid?  Protocols used: INFORMATION ONLY CALL - NO TRIAGE-A-AH

## 2022-01-29 ENCOUNTER — Other Ambulatory Visit: Payer: Self-pay

## 2022-01-29 ENCOUNTER — Emergency Department
Admission: EM | Admit: 2022-01-29 | Discharge: 2022-01-29 | Disposition: A | Discharge status: Home / Self Care | Payer: Self-pay | Attending: Emergency Medicine | Admitting: Emergency Medicine

## 2022-01-29 DIAGNOSIS — H66001 Acute suppurative otitis media without spontaneous rupture of ear drum, right ear: Secondary | ICD-10-CM | POA: Insufficient documentation

## 2022-01-29 DIAGNOSIS — Z20822 Contact with and (suspected) exposure to covid-19: Secondary | ICD-10-CM | POA: Insufficient documentation

## 2022-01-29 DIAGNOSIS — J029 Acute pharyngitis, unspecified: Secondary | ICD-10-CM | POA: Insufficient documentation

## 2022-01-29 LAB — RESP PANEL BY RT-PCR (FLU A&B, COVID) ARPGX2
Influenza A by PCR: NEGATIVE
Influenza B by PCR: NEGATIVE
SARS Coronavirus 2 by RT PCR: NEGATIVE

## 2022-01-29 LAB — GROUP A STREP BY PCR: Group A Strep by PCR: NOT DETECTED

## 2022-01-29 MED ORDER — PSEUDOEPH-BROMPHEN-DM 30-2-10 MG/5ML PO SYRP: 10.0000 mL | ORAL_SOLUTION | Freq: Four times a day (QID) | ORAL | 0 refills | Status: DC | PRN

## 2022-01-29 MED ORDER — AZITHROMYCIN 250 MG PO TABS: ORAL_TABLET | ORAL | 0 refills | Status: DC

## 2022-01-29 MED ORDER — ALBUTEROL SULFATE HFA 108 (90 BASE) MCG/ACT IN AERS: 2.0000 | INHALATION_SPRAY | RESPIRATORY_TRACT | 1 refills | Status: DC | PRN

## 2022-01-29 NOTE — ED Notes (Signed)
See triage note presents with right sided sore throat and ear pain  states she had some fever at home but is currently afebrile ?

## 2022-01-29 NOTE — ED Triage Notes (Signed)
Pt comes pov with sore throat and right ear pain since yesteday ?

## 2022-01-29 NOTE — ED Provider Notes (Signed)
? ?[facility] ?Provider Note ? ? ? Event Date/Time  ? First MD Initiated Contact with Patient 01/29/22 1314   ?  (approximate) ? ? ?History  ? ?Sore Throat ? ? ?HPI ? ?Melanie Whitaker is a 36 y.o. female with no significant past medical history and as listed in EMR presents to the emergency department for treatment and evaluation of right earache, sore throat, and cough.  Symptoms started 4 to 5 days ago.  Earache and sore throat have gotten worse each day.  No known sick exposures.  No relief with ibuprofen or Magic mouthwash.. ? ?  ? ? ?Physical Exam  ? ?Triage Vital Signs: ?ED Triage Vitals  ?Enc Vitals Group  ?   BP 01/29/22 1232 129/90  ?   Pulse Rate 01/29/22 1232 89  ?   Resp 01/29/22 1232 16  ?   Temp 01/29/22 1232 98.4 ?F (36.9 ?C)  ?   Temp Source 01/29/22 1232 Oral  ?   SpO2 01/29/22 1232 94 %  ?   Weight --   ?   Height --   ?   Head Circumference --   ?   Peak Flow --   ?   Pain Score 01/29/22 1223 8  ?   Pain Loc --   ?   Pain Edu? --   ?   Excl. in [place]? --   ? ? ?Most recent vital signs: ?Vitals:  ? 01/29/22 1232 01/29/22 1524  ?BP: 129/90 120/70  ?Pulse: 89 80  ?Resp: 16 16  ?Temp: 98.4 ?F (36.9 ?C)   ?SpO2: 94% 97%  ? ? ?General: Awake, no distress.  ?CV:  Good peripheral perfusion.  ?Resp:  Normal effort.  ?Abd:  No distention.  ?Other:  Right TM is erythematous, dull, bulging but intact.  Tonsils are absent.  Cobblestone drainage noted in the posterior oropharynx. ? ? ?ED Results / Procedures / Treatments  ? ?Labs ?(all labs ordered are listed, but only abnormal results are displayed) ?Labs Reviewed  ?GROUP A STREP BY PCR  ?RESP PANEL BY RT-PCR (FLU A&B, COVID) [id number]  ? ? ? ?EKG ? ?Not indicated ? ? ?RADIOLOGY ? ?Image and radiology report reviewed by me. ? ?Not indicated ? ?PROCEDURES: ? ?Critical Care performed: No ? ?Procedures ? ? ?MEDICATIONS ORDERED IN ED: ?Medications - No data to display ? ? ?IMPRESSION / MDM / ASSESSMENT AND PLAN / ED COURSE  ? ?I have reviewed the  triage note. ? ?Differential diagnosis includes, but is not limited to COVID, influenza, strep, viral syndrome, tonsillitis, otitis media ? ?36 year old female presenting to the emergency department for treatment and evaluation of symptoms as described in the HPI. ? ?On exam, her breath sounds are clear.  Her right ear does appear to be infected and she will be treated with antibiotics.  She is out of her albuterol and refill will be provided.  She will be given a prescription for Bromfed which should help with sore throat and cough.  She was encouraged to return to the emergency department if she is unable to schedule an appointment with her primary care provider and her symptoms or not improving over the next 24 to 48 hours. ?  ? ? ?FINAL CLINICAL IMPRESSION(S) / ED DIAGNOSES  ? ?Final diagnoses:  ?Non-recurrent acute suppurative otitis media of right ear without spontaneous rupture of tympanic membrane  ?Pharyngitis, unspecified etiology  ? ? ? ?Rx / DC Orders  ? ?ED Discharge Orders   ? ?  Ordered  ?  azithromycin (ZITHROMAX) 250 MG tablet  Status:  Discontinued       ? 01/29/22 1513  ?  brompheniramine-pseudoephedrine-DM 30-2-10 MG/5ML syrup  4 times daily PRN       ? 01/29/22 1513  ?  albuterol (VENTOLIN HFA) 108 (90 Base) MCG/ACT inhaler  Every 4 hours PRN       ? 01/29/22 1516  ?  azithromycin (ZITHROMAX) 250 MG tablet       ? 01/29/22 1525  ? ?  ?  ? ?  ? ? ? ?Note:  This document was prepared using Dragon voice recognition software and may include unintentional dictation errors. ?  ?Victorino Dike, FNP ?01/29/22 1603 ? ?  ?Vanessa Stuckey, MD ?01/30/22 1912 ? ?

## 2022-07-08 ENCOUNTER — Emergency Department: Payer: 59

## 2022-07-08 ENCOUNTER — Other Ambulatory Visit: Payer: Self-pay

## 2022-07-08 ENCOUNTER — Emergency Department
Admission: EM | Admit: 2022-07-08 | Discharge: 2022-07-08 | Disposition: A | Discharge status: Home / Self Care | Payer: 59 | Attending: Emergency Medicine | Admitting: Emergency Medicine

## 2022-07-08 ENCOUNTER — Encounter: Payer: Self-pay | Admitting: Intensive Care

## 2022-07-08 DIAGNOSIS — U071 COVID-19: Secondary | ICD-10-CM | POA: Diagnosis not present

## 2022-07-08 DIAGNOSIS — R0602 Shortness of breath: Secondary | ICD-10-CM | POA: Diagnosis not present

## 2022-07-08 DIAGNOSIS — E860 Dehydration: Secondary | ICD-10-CM | POA: Insufficient documentation

## 2022-07-08 HISTORY — DX: Anxiety disorder, unspecified: F41.9

## 2022-07-08 HISTORY — DX: Bipolar disorder, unspecified: F31.9

## 2022-07-08 LAB — CBC WITH DIFFERENTIAL/PLATELET
Abs Immature Granulocytes: 0.05 10*3/uL (ref 0.00–0.07)
Basophils Absolute: 0 10*3/uL (ref 0.0–0.1)
Basophils Relative: 0 %
Eosinophils Absolute: 0.3 10*3/uL (ref 0.0–0.5)
Eosinophils Relative: 3 %
HCT: 40 % (ref 36.0–46.0)
Hemoglobin: 12.9 g/dL (ref 12.0–15.0)
Immature Granulocytes: 1 %
Lymphocytes Relative: 28 %
Lymphs Abs: 2.9 10*3/uL (ref 0.7–4.0)
MCH: 27.3 pg (ref 26.0–34.0)
MCHC: 32.3 g/dL (ref 30.0–36.0)
MCV: 84.7 fL (ref 80.0–100.0)
Monocytes Absolute: 0.9 10*3/uL (ref 0.1–1.0)
Monocytes Relative: 8 %
Neutro Abs: 6.3 10*3/uL (ref 1.7–7.7)
Neutrophils Relative %: 60 %
Platelets: 322 10*3/uL (ref 150–400)
RBC: 4.72 MIL/uL (ref 3.87–5.11)
RDW: 13.7 % (ref 11.5–15.5)
WBC: 10.4 10*3/uL (ref 4.0–10.5)
nRBC: 0 % (ref 0.0–0.2)

## 2022-07-08 LAB — COMPREHENSIVE METABOLIC PANEL
ALT: 35 U/L (ref 0–44)
AST: 29 U/L (ref 15–41)
Albumin: 4 g/dL (ref 3.5–5.0)
Alkaline Phosphatase: 77 U/L (ref 38–126)
Anion gap: 10 (ref 5–15)
BUN: 13 mg/dL (ref 6–20)
CO2: 23 mmol/L (ref 22–32)
Calcium: 9.3 mg/dL (ref 8.9–10.3)
Chloride: 104 mmol/L (ref 98–111)
Creatinine, Ser: 0.85 mg/dL (ref 0.44–1.00)
GFR, Estimated: >60 mL/min (ref >60)
Glucose, Bld: 120 mg/dL — ABNORMAL HIGH (ref 70–99)
Potassium: 3.4 mmol/L — ABNORMAL LOW (ref 3.5–5.1)
Sodium: 137 mmol/L (ref 135–145)
Total Bilirubin: 0.7 mg/dL (ref 0.3–1.2)
Total Protein: 8.1 g/dL (ref 6.5–8.1)

## 2022-07-08 LAB — TROPONIN I (HIGH SENSITIVITY): Troponin I (High Sensitivity): 2 ng/L (ref <18)

## 2022-07-08 MED ORDER — METOCLOPRAMIDE HCL 5 MG/ML IJ SOLN
10.0000 mg | INTRAMUSCULAR | Status: AC
  Administered 2022-07-08: 10 mg via INTRAVENOUS
  Filled 2022-07-08: qty 2

## 2022-07-08 MED ORDER — PANTOPRAZOLE SODIUM 40 MG IV SOLR
40.0000 mg | Freq: Once | INTRAVENOUS | Status: AC
  Administered 2022-07-08: 40 mg via INTRAVENOUS
  Filled 2022-07-08: qty 10

## 2022-07-08 MED ORDER — PAXLOVID (300/100) 20 X 150 MG & 10 X 100MG PO TBPK: 3.0000 | ORAL_TABLET | Freq: Two times a day (BID) | ORAL | 0 refills | Status: AC

## 2022-07-08 MED ORDER — NAPROXEN 500 MG PO TABS: 500.0000 mg | ORAL_TABLET | Freq: Two times a day (BID) | ORAL | 0 refills | Status: DC

## 2022-07-08 MED ORDER — SODIUM CHLORIDE 0.9 % IV BOLUS
1000.0000 mL | Freq: Once | INTRAVENOUS | Status: AC
  Administered 2022-07-08: 1000 mL via INTRAVENOUS

## 2022-07-08 MED ORDER — ONDANSETRON HCL 4 MG/2ML IJ SOLN
4.0000 mg | Freq: Once | INTRAMUSCULAR | Status: AC
  Administered 2022-07-08: 4 mg via INTRAVENOUS
  Filled 2022-07-08: qty 2

## 2022-07-08 MED ORDER — FAMOTIDINE 20 MG PO TABS: 20.0000 mg | ORAL_TABLET | Freq: Two times a day (BID) | ORAL | 0 refills | Status: DC

## 2022-07-08 MED ORDER — ONDANSETRON 4 MG PO TBDP
4.0000 mg | ORAL_TABLET | Freq: Once | ORAL | Status: DC | PRN
  Filled 2022-07-08 (×2): qty 1

## 2022-07-08 MED ORDER — ONDANSETRON 4 MG PO TBDP: 4.0000 mg | ORAL_TABLET | Freq: Three times a day (TID) | ORAL | 0 refills | Status: DC | PRN

## 2022-07-08 MED ORDER — KETOROLAC TROMETHAMINE 15 MG/ML IJ SOLN
15.0000 mg | Freq: Once | INTRAMUSCULAR | Status: AC
Start: 2022-07-08 — End: 2022-07-08
  Administered 2022-07-08: 15 mg via INTRAVENOUS
  Filled 2022-07-08: qty 1

## 2022-07-08 NOTE — ED Triage Notes (Addendum)
Patient c/o emesis, sob, and diarrhea. SOB worse with exertion. Patient refused ODT zofran in triage

## 2022-07-08 NOTE — Discharge Instructions (Addendum)
According to CDC guidelines, you should isolate in your home for the first 5 days of illness, and then continue to wear a mask when around other people or out in public for an additional 5 days.     Person Under Monitoring Name: Melanie Whitaker  Location: [address] [place] [address]   Infection Prevention Recommendations for Individuals Confirmed to have, or Being Evaluated for, 2019 Novel Coronavirus (COVID-19) Infection Who Receive Care at Home  Individuals who are confirmed to have, or are being evaluated for, COVID-19 should follow the prevention steps below until a healthcare provider or local or state health department says they can return to normal activities.  Stay home except to get medical care You should restrict activities outside your home, except for getting medical care. Do not go to work, school, or public areas, and do not use public transportation or taxis.  Call ahead before visiting your doctor Before your medical appointment, call the healthcare provider and tell them that you have, or are being evaluated for, COVID-19 infection. This will help the healthcare provider's office take steps to keep other people from getting infected. Ask your healthcare provider to call the local or state health department.  Monitor your symptoms Seek prompt medical attention if your illness is worsening (e.g., difficulty breathing). Before going to your medical appointment, call the healthcare provider and tell them that you have, or are being evaluated for, COVID-19 infection. Ask your healthcare provider to call the local or state health department.  Wear a facemask You should wear a facemask that covers your nose and mouth when you are in the same room with other people and when you visit a healthcare provider. People who live with or visit you should also wear a facemask while they are in the same room with you.  Separate yourself from other people in your  home As much as possible, you should stay in a different room from other people in your home. Also, you should use a separate bathroom, if available.  Avoid sharing household items You should not share dishes, drinking glasses, cups, eating utensils, towels, bedding, or other items with other people in your home. After using these items, you should wash them thoroughly with soap and water.  Cover your coughs and sneezes Cover your mouth and nose with a tissue when you cough or sneeze, or you can cough or sneeze into your sleeve. Throw used tissues in a lined trash can, and immediately wash your hands with soap and water for at least 20 seconds or use an alcohol-based hand rub.  Wash your [facility] your hands often and thoroughly with soap and water for at least 20 seconds. You can use an alcohol-based hand sanitizer if soap and water are not available and if your hands are not visibly dirty. Avoid touching your eyes, nose, and mouth with unwashed hands.   Prevention Steps for Caregivers and Household Members of Individuals Confirmed to have, or Being Evaluated for, COVID-19 Infection Being Cared for in the Home  If you live with, or provide care at home for, a person confirmed to have, or being evaluated for, COVID-19 infection please follow these guidelines to prevent infection:  Follow healthcare provider's instructions Make sure that you understand and can help the patient follow any healthcare provider instructions for all care.  Provide for the patient's basic needs You should help the patient with basic needs in the home and provide support for getting groceries,  prescriptions, and other personal needs.  Monitor the patient's symptoms If they are getting sicker, call his or her medical provider and tell them that the patient has, or is being evaluated for, COVID-19 infection. This will help the healthcare provider's office take steps to keep other people from getting  infected. Ask the healthcare provider to call the local or state health department.  Limit the number of people who have contact with the patient If possible, have only one caregiver for the patient. Other household members should stay in another home or place of residence. If this is not possible, they should stay in another room, or be separated from the patient as much as possible. Use a separate bathroom, if available. Restrict visitors who do not have an essential need to be in the home.  Keep older adults, very young children, and other sick people away from the patient Keep older adults, very young children, and those who have compromised immune systems or chronic health conditions away from the patient. This includes people with chronic heart, lung, or kidney conditions, diabetes, and cancer.  Ensure good ventilation Make sure that shared spaces in the home have good air flow, such as from an air conditioner or an opened window, weather permitting.  Wash your hands often Wash your hands often and thoroughly with soap and water for at least 20 seconds. You can use an alcohol based hand sanitizer if soap and water are not available and if your hands are not visibly dirty. Avoid touching your eyes, nose, and mouth with unwashed hands. Use disposable paper towels to dry your hands. If not available, use dedicated cloth towels and replace them when they become wet.  Wear a facemask and gloves Wear a disposable facemask at all times in the room and gloves when you touch or have contact with the patient's blood, body fluids, and/or secretions or excretions, such as sweat, saliva, sputum, nasal mucus, vomit, urine, or feces.  Ensure the mask fits over your nose and mouth tightly, and do not touch it during use. Throw out disposable facemasks and gloves after using them. Do not reuse. Wash your hands immediately after removing your facemask and gloves. If your personal clothing becomes  contaminated, carefully remove clothing and launder. Wash your hands after handling contaminated clothing. Place all used disposable facemasks, gloves, and other waste in a lined container before disposing them with other household waste. Remove gloves and wash your hands immediately after handling these items.  Do not share dishes, glasses, or other household items with the patient Avoid sharing household items. You should not share dishes, drinking glasses, cups, eating utensils, towels, bedding, or other items with a patient who is confirmed to have, or being evaluated for, COVID-19 infection. After the person uses these items, you should wash them thoroughly with soap and water.  Wash laundry thoroughly Immediately remove and wash clothes or bedding that have blood, body fluids, and/or secretions or excretions, such as sweat, saliva, sputum, nasal mucus, vomit, urine, or feces, on them. Wear gloves when handling laundry from the patient. Read and follow directions on labels of laundry or clothing items and detergent. In general, wash and dry with the warmest temperatures recommended on the label.  Clean all areas the individual has used often Clean all touchable surfaces, such as counters, tabletops, doorknobs, bathroom fixtures, toilets, phones, keyboards, tablets, and bedside tables, every day. Also, clean any surfaces that may have blood, body fluids, and/or secretions or excretions on them. Wear gloves  when cleaning surfaces the patient has come in contact with. Use a diluted bleach solution (e.g., dilute bleach with 1 part bleach and 10 parts water) or a household disinfectant with a label that says EPA-registered for coronaviruses. To make a bleach solution at home, add 1 tablespoon of bleach to 1 quart (4 cups) of water. For a larger supply, add  cup of bleach to 1 gallon (16 cups) of water. Read labels of cleaning products and follow recommendations provided on product labels. Labels  contain instructions for safe and effective use of the cleaning product including precautions you should take when applying the product, such as wearing gloves or eye protection and making sure you have good ventilation during use of the product. Remove gloves and wash hands immediately after cleaning.  Monitor yourself for signs and symptoms of illness Caregivers and household members are considered close contacts, should monitor their health, and will be asked to limit movement outside of the home to the extent possible. Follow the monitoring steps for close contacts listed on the symptom monitoring form.   ? If you have additional questions, contact your local health department or call the epidemiologist on call at [phone] (available 24/7). ? This guidance is subject to change. For the most up-to-date guidance from [facility], please refer to their website: [email or web address]

## 2022-07-08 NOTE — ED Notes (Signed)
Pt A&OX4 ambulatory at d/c with independent steady gait. Pt verbalized understanding of d/c instructions, prescriptions and follow up care. 

## 2022-07-08 NOTE — ED Provider Notes (Signed)
[facility] Provider Note    Event Date/Time   First MD Initiated Contact with Patient 07/08/22 1920     (approximate)   History   Chief Complaint: Emesis and Shortness of Breath   HPI  Melanie Whitaker is a 36 y.o. female with a history of bipolar disorder, anxiety who comes the ED complaining of body aches, dyspnea on exertion, vomiting, diarrhea, fever, generalized headache.  She notes that 4 days ago she was exposed to her sister who recently had [place].  Symptoms started 2 days ago.  Gradual onset, waxing waning without aggravating or alleviating factors.  Overall worsening and unable to tolerate oral intake today.  Also some dizziness with standing.  Patient took a COVID test today at work which was faintly positive.  She went home and took another test which was clearly positive.     Physical Exam   Triage Vital Signs: ED Triage Vitals  Enc Vitals Group     BP 07/08/22 1752 (!) 145/100     Pulse Rate 07/08/22 1752 (!) 107     Resp 07/08/22 1752 18     Temp 07/08/22 1752 98.4 F (36.9 C)     Temp Source 07/08/22 1752 Oral     SpO2 07/08/22 1752 96 %     Weight 07/08/22 1753 230 lb (104.3 kg)     Height 07/08/22 1753 '5\' 10"'$  (1.778 m)     Head Circumference --      Peak Flow --      Pain Score 07/08/22 1759 8     Pain Loc --      Pain Edu? --      Excl. in [place]? --     Most recent vital signs: Vitals:   07/08/22 1752  BP: (!) 145/100  Pulse: (!) 107  Resp: 18  Temp: 98.4 F (36.9 C)  SpO2: 96%    General: Awake, no distress.  CV:  Good peripheral perfusion.  Tachycardia heart rate 100 Resp:  Normal effort.  Clear to auscultation bilaterally Abd:  No distention.  Soft without focal tenderness. Other:  No rash or lower extremity edema.   ED Results / Procedures / Treatments   Labs (all labs ordered are listed, but only abnormal results are displayed) Labs Reviewed  COMPREHENSIVE METABOLIC PANEL - Abnormal; Notable for the  following components:      Result Value   Potassium 3.4 (*)    Glucose, Bld 120 (*)    All other components within normal limits  CBC WITH DIFFERENTIAL/PLATELET  TROPONIN I (HIGH SENSITIVITY)  TROPONIN I (HIGH SENSITIVITY)     EKG    RADIOLOGY Chest x-ray interpreted by me, appears normal.  Radiology report reviewed.   PROCEDURES:  Procedures   MEDICATIONS ORDERED IN ED: Medications  ondansetron (ZOFRAN-ODT) disintegrating tablet 4 mg (has no administration in time range)  sodium chloride 0.9 % bolus 1,000 mL (has no administration in time range)  ondansetron (ZOFRAN) injection 4 mg (has no administration in time range)  pantoprazole (PROTONIX) injection 40 mg (has no administration in time range)  ketorolac (TORADOL) 15 MG/ML injection 15 mg (has no administration in time range)     IMPRESSION / MDM / ASSESSMENT AND PLAN / ED COURSE  I reviewed the triage vital signs and the nursing notes.                              Differential  diagnosis includes, but is not limited to, COVID with viral syndrome, dehydration, AKI, electrolyte abnormality, anemia, pneumonia  Patient's presentation is most consistent with acute presentation with potential threat to life or bodily function.  Patient presents with 2 days of worsening symptoms of viral syndrome with positive home COVID test today.  She is nontoxic, vital signs unremarkable except for mild tachycardia.  Exam is reassuring.  Chest x-ray and labs are unremarkable.  We will give IV fluids and medications for symptom relief.  Started on Paxlovid, supportive care at home.  Counseled on isolation guidelines and return precautions.   Clinical Course as of 07/08/22 2104  Mon Jul 08, 2022  2104 Ultrasound PIV placed by me due to multiple failed attempts by nursing [PS]    Clinical Course User Index [PS] Carrie Mew, MD     FINAL CLINICAL IMPRESSION(S) / ED DIAGNOSES   Final diagnoses:  COVID-19 virus infection   Dehydration     Rx / DC Orders   ED Discharge Orders          Ordered    nirmatrelvir & ritonavir (PAXLOVID, 300/100,) 20 x 150 MG & 10 x '100MG'$  TBPK  2 times daily        07/08/22 2016    naproxen (NAPROSYN) 500 MG tablet  2 times daily with meals        07/08/22 2016    ondansetron (ZOFRAN-ODT) 4 MG disintegrating tablet  Every 8 hours PRN        07/08/22 2016    famotidine (PEPCID) 20 MG tablet  2 times daily        07/08/22 2016             Note:  This document was prepared using Dragon voice recognition software and may include unintentional dictation errors.   Carrie Mew, MD 07/08/22 2029

## 2022-07-08 NOTE — ED Notes (Signed)
IV attempt x 3 unsuccessfully.

## 2022-10-15 ENCOUNTER — Emergency Department
Admission: EM | Admit: 2022-10-15 | Discharge: 2022-10-15 | Disposition: A | Discharge status: Home / Self Care | Payer: 59 | Attending: Student in an Organized Health Care Education/Training Program | Admitting: Student in an Organized Health Care Education/Training Program

## 2022-10-15 ENCOUNTER — Other Ambulatory Visit: Payer: Self-pay

## 2022-10-15 DIAGNOSIS — R059 Cough, unspecified: Secondary | ICD-10-CM | POA: Diagnosis not present

## 2022-10-15 DIAGNOSIS — J111 Influenza due to unidentified influenza virus with other respiratory manifestations: Secondary | ICD-10-CM

## 2022-10-15 DIAGNOSIS — Z20822 Contact with and (suspected) exposure to covid-19: Secondary | ICD-10-CM | POA: Insufficient documentation

## 2022-10-15 DIAGNOSIS — J101 Influenza due to other identified influenza virus with other respiratory manifestations: Secondary | ICD-10-CM | POA: Diagnosis not present

## 2022-10-15 LAB — RESP PANEL BY RT-PCR (RSV, FLU A&B, COVID)  RVPGX2
Influenza A by PCR: POSITIVE — AB
Influenza B by PCR: NEGATIVE
Resp Syncytial Virus by PCR: NEGATIVE
SARS Coronavirus 2 by RT PCR: NEGATIVE

## 2022-10-15 MED ORDER — ALBUTEROL SULFATE HFA 108 (90 BASE) MCG/ACT IN AERS: 2.0000 | INHALATION_SPRAY | RESPIRATORY_TRACT | 1 refills | Status: DC | PRN

## 2022-10-15 MED ORDER — ONDANSETRON 4 MG PO TBDP: 4.0000 mg | ORAL_TABLET | Freq: Three times a day (TID) | ORAL | 0 refills | Status: DC | PRN

## 2022-10-15 MED ORDER — PSEUDOEPH-BROMPHEN-DM 30-2-10 MG/5ML PO SYRP: 10.0000 mL | ORAL_SOLUTION | Freq: Four times a day (QID) | ORAL | 0 refills | Status: DC | PRN

## 2022-10-15 NOTE — Discharge Instructions (Addendum)
Your viral test was positive for flu. Take the prescription meds as directed. Follow-up with your provider or a local urgent care for ongoing symptoms.

## 2022-10-15 NOTE — ED Provider Notes (Signed)
[facility] Emergency Department Provider Note     Event Date/Time   First MD Initiated Contact with Patient 10/15/22 1900     (approximate)   History   URI   HPI  Melanie Whitaker is a 36 y.o. female sinus congestion, cough and fatigue.  Patient does endorse sick contacts.   Physical Exam   Triage Vital Signs: ED Triage Vitals  Enc Vitals Group     BP 10/15/22 1755 (!) 132/92     Pulse Rate 10/15/22 1755 (!) 107     Resp 10/15/22 1755 18     Temp 10/15/22 1755 100 F (37.8 C)     Temp Source 10/15/22 1755 Oral     SpO2 10/15/22 1755 100 %     Weight 10/15/22 1751 230 lb (104.3 kg)     Height 10/15/22 1751 '5\' 10"'$  (1.778 m)     Head Circumference --      Peak Flow --      Pain Score 10/15/22 1751 7     Pain Loc --      Pain Edu? --      Excl. in [place]? --     Most recent vital signs: Vitals:   10/15/22 1755  BP: (!) 132/92  Pulse: (!) 107  Resp: 18  Temp: 100 F (37.8 C)  SpO2: 100%    General Awake, no distress. NAD HEENT NCAT. PERRL. EOMI. No rhinorrhea. Mucous membranes are moist.  CV:  Good peripheral perfusion.  RESP:  Normal effort.  ABD:  No distention.    ED Results / Procedures / Treatments   Labs (all labs ordered are listed, but only abnormal results are displayed) Labs Reviewed  RESP PANEL BY RT-PCR (RSV, FLU A&B, COVID)  RVPGX2 - Abnormal; Notable for the following components:      Result Value   Influenza A by PCR POSITIVE (*)    All other components within normal limits     EKG  RADIOLOGY   PROCEDURES:  Critical Care performed: No  Procedures   MEDICATIONS ORDERED IN ED: Medications - No data to display   IMPRESSION / MDM / [place] / ED COURSE  I reviewed the triage vital signs and the nursing notes.                              Differential diagnosis includes, but is not limited to, COVID, flu, RSV, AOM, viral gastroenteritis  Patient's presentation is most consistent with  acute complicated illness / injury requiring diagnostic workup.  To the ED for evaluation of flulike symptoms with recent sick contacts.  She was evaluated for complaints in the ED, found have reassuring exam and workup overall.  Patient's diagnosis is consistent with flu, after her viral panel test confirmed infection. Patient will be discharged home with prescriptions for albuterol inhaler, Bromfed syrup, and Zofran. Patient is to follow up with primary provider as needed or otherwise directed. Patient is given ED precautions to return to the ED for any worsening or new symptoms.     FINAL CLINICAL IMPRESSION(S) / ED DIAGNOSES   Final diagnoses:  Influenza     Rx / DC Orders   ED Discharge Orders     None        Note:  This document was prepared using Dragon voice recognition software and may include unintentional dictation errors.    Nialah Saravia, Dannielle Karvonen, PA-C  10/16/22 0033    Merlyn Lot, MD 10/16/22 1650

## 2022-10-15 NOTE — ED Triage Notes (Signed)
Reports sinus congestion cough and fatigue.  Reports sick contact.

## 2022-10-15 NOTE — ED Provider Triage Note (Signed)
Emergency Medicine Provider Triage Evaluation Note  Melanie Whitaker , a 36 y.o. female  was evaluated in triage.  Pt complains of headaches and fatigue and body aches and cough since 12/24. Has pain in chest when coughing. No CP when not coughing.  Review of Systems  Positive: Headache, fatigue, body ache, cough Negative: fever  Physical Exam  There were no vitals taken for this visit. Gen:   Awake, no distress   Resp:  Normal effort  MSK:   Moves extremities without difficulty  Other:    Medical Decision Making  Medically screening exam initiated at 5:50 PM.  Appropriate orders placed.  MAGIN BALBI was informed that the remainder of the evaluation will be completed by another provider, this initial triage assessment does not replace that evaluation, and the importance of remaining in the ED until their evaluation is complete.     Marquette Old, PA-C 10/15/22 1752

## 2022-10-18 ENCOUNTER — Telehealth: Payer: Self-pay

## 2022-10-18 NOTE — Patient Outreach (Addendum)
  Care Coordination TOC Note Transition Care Management Follow-up Telephone Call Date of discharge and from where: 10/15/22-[facility] D  DX: "Influenza" Red on EMMI-ED Discharge Alert Reason:"Scheduled follow-up appt? No" Red Alert Date: 10/17/22 How have you been since you were released from the hospital? Patient states she is "doing okay." She continues to have coughing at times.  Any questions or concerns? Yes-Patient reports that cough syrup prescribed in on back order and unable to obtain. She called ER to speak with someone regarding the matter to get script for new med instead and is awaiting return call back. Patient endorses that she will not take anything OTC as she has to pay out of pocket for it. She only wants to take prescription cough syrup so it will be free. She reports that her [name] covers runs out on 10/20/22. She has applied for Medicaid and awaiting approval. She does not have a PCP. Strongly encouraged patient to establish care with provider and info given to her.   Items Reviewed: Did the pt receive and understand the discharge instructions provided? Yes  Medications obtained and verified?  Patient states she was unable to get cough syrup as med on back order at all local pharmcies. Encouraged patient to disc Other? No  Any new allergies since your discharge? No  Dietary orders reviewed? Yes Do you have support at home? Yes   Home Care and Equipment/Supplies: Were home health services ordered? not applicable If so, what is the name of the agency? N/A  Has the agency set up a time to come to the patient's home? not applicable Were any new equipment or medical supplies ordered?  No What is the name of the medical supply agency? N/A Were you able to get the supplies/equipment? not applicable Do you have any questions related to the use of the equipment or supplies? No  Functional Questionnaire: (I = Independent and D = Dependent) ADLs: I  Bathing/Dressing- I  Meal Prep-  I  Eating- I  Maintaining continence- I  Transferring/Ambulation- I  Managing Meds- I  Follow up appointments reviewed:  PCP Hospital f/u appt confirmed? No . [facility] f/u appt confirmed?  N/A . Are transportation arrangements needed? No  If their condition worsens, is the pt aware to call PCP or go to the Emergency Dept.? Yes Was the patient provided with contact information for the PCP's office or ED? Yes Was to pt encouraged to call back with questions or concerns? Yes  SDOH assessments and interventions completed:   Yes SDOH Interventions Today    Flowsheet Row Most Recent Value  SDOH Interventions   Transportation Interventions Intervention Not Indicated       Care Coordination Interventions:  Patient provided with contact info to find a PCP and will follow up.     Encounter Outcome:  Pt. Visit Completed    Enzo Montgomery, RN,BSN,CCM [facility] Telephonic Care Management Coordinator Direct Phone: [phone] Toll Free: [phone] Fax: [phone]

## 2022-10-18 NOTE — Patient Outreach (Signed)
  Care Coordination TOC Note Transition Care Management Unsuccessful Follow-up Telephone Call  Date of discharge and from where:  10/15/22-[facility] ED  Attempts:  1st Attempt  Reason for unsuccessful TCM follow-up call:  Left voice message    Hetty [facility] Telephonic Care Management Coordinator Direct Phone: [phone] Toll Free: [phone] Fax: [phone]

## 2022-11-15 DIAGNOSIS — H5200 Hypermetropia, unspecified eye: Secondary | ICD-10-CM | POA: Diagnosis not present

## 2022-11-15 DIAGNOSIS — H524 Presbyopia: Secondary | ICD-10-CM | POA: Diagnosis not present

## 2022-11-19 DIAGNOSIS — H5213 Myopia, bilateral: Secondary | ICD-10-CM | POA: Diagnosis not present

## 2022-12-19 NOTE — Progress Notes (Unsigned)
I,Sulibeya S Dimas,acting as a Education administrator for Gwyneth Sprout, FNP.,have documented all relevant documentation on the behalf of Gwyneth Sprout, FNP,as directed by  Gwyneth Sprout, FNP while in the presence of Gwyneth Sprout, FNP.  New patient visit  Patient: Melanie Whitaker   DOB: November 14, 1985   37 y.o. Female  MRN: [id number] Visit Date: 12/20/2022  Today's healthcare provider: Gwyneth Sprout, FNP  Patient presents for new patient visit to establish care.  Introduced to Designer, jewellery role and practice setting.  All questions answered.  Discussed provider/patient relationship and expectations.   Chief Complaint  Patient presents with   New Patient (Initial Visit)   Subjective    Melanie Whitaker is a 37 y.o. female who presents today as a new patient to establish care.  HPI  No previous PCP.  Patient c/o sweats, fatigue and irritability. She is not sure if she is going through menopause. She reports 2-3 periods only in a year. She reports heavy menstrual bleeding. This has been on going for a few years. Patient was seen at [facility] ED on 06/06/2019 for this reason. She was advised to fu with GYN. She reports not having insurance and did not fu with GYN.   Patient reports she has a history of depression and anxiety. She reports she has been able to handle with out medication.   Past Medical History:  Diagnosis Date   Anxiety    Depression    GERD (gastroesophageal reflux disease)    Manic depression ([place])    Past Surgical History:  Procedure Laterality Date   APPENDECTOMY     CHOLECYSTECTOMY     TONSILLECTOMY     TUBAL LIGATION     Family Status  Relation Name Status   Mother  (Not Specified)   Father  (Not Specified)   Daughter  (Not Specified)   Son  (Not Specified)   Mat Aunt  (Not Specified)   Mat Uncle  (Not Specified)   Annamarie Major  (Not Specified)   MGM  (Not Specified)   MGF  (Not Specified)   PGM  (Not Specified)   PGF  (Not Specified)   Family History  Problem Relation  Age of Onset   COPD Mother    Uterine cancer Mother    Diabetes Father    COPD Father    Crohn's disease Father    Heart attack Father    Diabetes Daughter    ADD / ADHD Son    Stroke Maternal Aunt    Crohn's disease Maternal Uncle    Diabetes Paternal Uncle    Crohn's disease Paternal Uncle    Colon cancer Maternal Grandmother    Diabetes Maternal Grandfather    Diabetes Paternal Grandmother    Heart failure Paternal Grandmother    Dementia Paternal Grandmother    Heart failure Paternal Grandfather    Lung cancer Paternal Grandfather    Social History   Socioeconomic History   Marital status: Single    Spouse name: Not on file   Number of children: 3   Years of education: Not on file   Highest education level: Not on file  Occupational History   Not on file  Tobacco Use   Smoking status: Never   Smokeless tobacco: Never  Vaping Use   Vaping Use: Never used  Substance and Sexual Activity   Alcohol use: No   Drug use: Never   Sexual activity: Not on file    Comment: tubaligation  Other Topics Concern   Not on file  Social History Narrative   Not on file   Social Determinants of Health   Financial Resource Strain: Low Risk  (12/09/2018)   Overall Financial Resource Strain (CARDIA)    Difficulty of Paying Living Expenses: Not hard at all  Food Insecurity: No Food Insecurity (12/09/2018)   Hunger Vital Sign    Worried About Running Out of Food in the Last Year: Never true    Ran Out of Food in the Last Year: Never true  Transportation Needs: No Transportation Needs (10/18/2022)   PRAPARE - Hydrologist (Medical): No    Lack of Transportation (Non-Medical): No  Physical Activity: Sufficiently Active (12/09/2018)   Exercise Vital Sign    Days of Exercise per Week: 4 days    Minutes of Exercise per Session: 60 min  Stress: No Stress Concern Present (12/09/2018)   [place]    Feeling of Stress : Not at all  Social Connections: Somewhat Isolated (12/09/2018)   Social Connection and Isolation Panel [NHANES]    Frequency of Communication with Friends and Family: More than three times a week    Frequency of Social Gatherings with Friends and Family: More than three times a week    Attends Religious Services: More than 4 times per year    Active Member of [employer] or Organizations: No    Attends Archivist Meetings: Never    Marital Status: Never married   Outpatient Medications Prior to Visit  Medication Sig   [DISCONTINUED] albuterol (VENTOLIN HFA) 108 (90 Base) MCG/ACT inhaler Inhale 2 puffs into the lungs every 4 (four) hours as needed for wheezing or shortness of breath. (Patient not taking: Reported on 12/20/2022)   [DISCONTINUED] brompheniramine-pseudoephedrine-DM 30-2-10 MG/5ML syrup Take 10 mLs by mouth 4 (four) times daily as needed. (Patient not taking: Reported on 12/20/2022)   [DISCONTINUED] citalopram (CELEXA) 10 MG tablet Take 10 mg by mouth at bedtime. (Patient not taking: Reported on 12/20/2022)   [DISCONTINUED] famotidine (PEPCID) 20 MG tablet Take 1 tablet (20 mg total) by mouth 2 (two) times daily. (Patient not taking: Reported on 12/20/2022)   [DISCONTINUED] ondansetron (ZOFRAN-ODT) 4 MG disintegrating tablet Take 1 tablet (4 mg total) by mouth every 8 (eight) hours as needed for nausea or vomiting. (Patient not taking: Reported on 12/20/2022)   No facility-administered medications prior to visit.   Allergies  Allergen Reactions   Sulfa Antibiotics Anaphylaxis   Penicillins    Phenergan [Promethazine Hcl] Other (See Comments)    halluciations     Immunization History  Administered Date(s) Administered   H1N1 12/19/2008   Hepatitis B, PED/ADOLESCENT 08/02/1996, 09/06/1996, 02/14/1997   Influenza-Unspecified 12/19/2008, 07/18/2009, 09/18/2011   Tdap 02/11/2012    Health Maintenance  Topic Date Due   COVID-19 Vaccine (1)  Never done   Hepatitis C Screening  Never done   PAP SMEAR-Modifier  Never done   DTaP/Tdap/Td (2 - Td or Tdap) 02/10/2022   INFLUENZA VACCINE  01/19/2023 (Originally 05/21/2022)   HIV Screening  Completed   HPV VACCINES  Aged Out    Patient Care Team: Gwyneth Sprout, FNP as PCP - General (Family Medicine)  Review of Systems  Constitutional:  Positive for appetite change, diaphoresis and fatigue.  Gastrointestinal:  Positive for abdominal distention, blood in stool, diarrhea and nausea.  Skin:  Positive for rash.    {Labs  Heme  Chem  Endocrine  Serology  Results Review (optional):23779}   Objective    BP 130/84 (BP Location: Left Arm, Patient Position: Sitting, Cuff Size: Large)   Pulse 85   Temp 98.2 F (36.8 C) (Temporal)   Resp (!) 24   Ht '5\' 9"'$  (1.753 m)   Wt 237 lb 14.4 oz (107.9 kg)   LMP 09/12/2022 (Approximate)   BMI 35.13 kg/m  {Show previous vital signs (optional):23777}  Physical Exam Vitals and nursing note reviewed.  Constitutional:      General: She is awake. She is not in acute distress.    Appearance: Normal appearance. She is well-developed and well-groomed. She is obese. She is not ill-appearing, toxic-appearing or diaphoretic.  HENT:     Head: Normocephalic and atraumatic.     Jaw: There is normal jaw occlusion. No trismus, tenderness, swelling or pain on movement.     Right Ear: Hearing, tympanic membrane, ear canal and external ear normal. There is no impacted cerumen.     Left Ear: Hearing, tympanic membrane, ear canal and external ear normal. There is no impacted cerumen.     Nose: Nose normal. No congestion or rhinorrhea.     Right Turbinates: Not enlarged, swollen or pale.     Left Turbinates: Not enlarged, swollen or pale.     Right Sinus: No maxillary sinus tenderness or frontal sinus tenderness.     Left Sinus: No maxillary sinus tenderness or frontal sinus tenderness.     Mouth/Throat:     Lips: Pink.     Mouth: Mucous membranes are  moist. No injury.     Tongue: No lesions.     Pharynx: Oropharynx is clear. Uvula midline. No pharyngeal swelling, oropharyngeal exudate, posterior oropharyngeal erythema or uvula swelling.     Tonsils: No tonsillar exudate or tonsillar abscesses.  Eyes:     General: Lids are normal. Lids are everted, no foreign bodies appreciated. Vision grossly intact. Gaze aligned appropriately. No allergic shiner or visual field deficit.       Right eye: No discharge.        Left eye: No discharge.     Extraocular Movements: Extraocular movements intact.     Conjunctiva/sclera: Conjunctivae normal.     Right eye: Right conjunctiva is not injected. No exudate.    Left eye: Left conjunctiva is not injected. No exudate.    Pupils: Pupils are equal, round, and reactive to light.  Neck:     Thyroid: No thyroid mass, thyromegaly or thyroid tenderness.     Vascular: No carotid bruit.     Trachea: Trachea normal.  Cardiovascular:     Rate and Rhythm: Normal rate and regular rhythm.     Pulses: Normal pulses.          Carotid pulses are 2+ on the right side and 2+ on the left side.      Radial pulses are 2+ on the right side and 2+ on the left side.       Dorsalis pedis pulses are 2+ on the right side and 2+ on the left side.       Posterior tibial pulses are 2+ on the right side and 2+ on the left side.     Heart sounds: Normal heart sounds, S1 normal and S2 normal. No murmur heard.    No friction rub. No gallop.  Pulmonary:     Effort: Pulmonary effort is normal. No respiratory distress.     Breath sounds: Normal breath sounds and air entry.  No stridor. No wheezing, rhonchi or rales.  Chest:     Chest wall: No tenderness.  Abdominal:     General: Abdomen is flat. Bowel sounds are normal. There is no distension.     Palpations: Abdomen is soft. There is no mass.     Tenderness: There is no abdominal tenderness. There is no right CVA tenderness, left CVA tenderness, guarding or rebound.     Hernia: No  hernia is present.  Genitourinary:    Comments: Exam deferred; denies complaints Musculoskeletal:        General: No swelling, tenderness, deformity or signs of injury. Normal range of motion.     Cervical back: Full passive range of motion without pain, normal range of motion and neck supple. No edema, rigidity or tenderness. No muscular tenderness.     Right lower leg: No edema.     Left lower leg: No edema.  Lymphadenopathy:     Cervical: No cervical adenopathy.     Right cervical: No superficial, deep or posterior cervical adenopathy.    Left cervical: No superficial, deep or posterior cervical adenopathy.  Skin:    General: Skin is warm and dry.     Capillary Refill: Capillary refill takes less than 2 seconds.     Coloration: Skin is not jaundiced or pale.     Findings: No bruising, erythema, lesion or rash.  Neurological:     General: No focal deficit present.     Mental Status: She is alert and oriented to person, place, and time. Mental status is at baseline.     GCS: GCS eye subscore is 4. GCS verbal subscore is 5. GCS motor subscore is 6.     Sensory: Sensation is intact. No sensory deficit.     Motor: Motor function is intact. No weakness.     Coordination: Coordination is intact. Coordination normal.     Gait: Gait is intact. Gait normal.  Psychiatric:        Attention and Perception: Attention and perception normal.        Mood and Affect: Mood and affect normal.        Speech: Speech normal.        Behavior: Behavior normal. Behavior is cooperative.        Thought Content: Thought content normal.        Cognition and Memory: Cognition and memory normal.        Judgment: Judgment normal.    Depression Screen    12/20/2022    1:57 PM  PHQ 2/9 Scores  PHQ - 2 Score 6  PHQ- 9 Score 15   No results found for any visits on 12/20/22.  Assessment & Plan      Problem List Items Addressed This Visit       Cardiovascular and Mediastinum   Perimenopausal vasomotor  symptoms   Relevant Orders   FSH/LH     Musculoskeletal and Integument   Atypical nevi   Relevant Orders   Ambulatory referral to Dermatology     Other   Class 2 severe obesity due to excess calories with serious comorbidity and body mass index (BMI) of 35.0 to 35.9 in adult [facility])   Relevant Orders   Ambulatory referral to Pulmonology   PSG Sleep Study   Depression, recurrent ([place]) - Primary   Relevant Medications   citalopram (CELEXA) 20 MG tablet   busPIRone (BUSPAR) 5 MG tablet   traZODone (DESYREL) 100 MG tablet   Encounter for hepatitis C  screening test for low risk patient   Relevant Orders   Hepatitis C Antibody   Encounter for vitamin deficiency screening   Relevant Orders   Vitamin D (25 hydroxy)   B12 and Folate Panel   Establishing care with new doctor, encounter for   Family history of Crohn's disease   Family history of diabetes mellitus   Relevant Orders   Hemoglobin A1c   Family history of heart failure   Relevant Orders   CBC with Differential/Platelet   Comprehensive Metabolic Panel (CMET)   Magnesium   Family history of melanoma   Relevant Orders   Ambulatory referral to Dermatology   GAD (generalized anxiety disorder)   Relevant Medications   citalopram (CELEXA) 20 MG tablet   busPIRone (BUSPAR) 5 MG tablet   traZODone (DESYREL) 100 MG tablet   History of apnea   Relevant Orders   Ambulatory referral to Pulmonology   PSG Sleep Study   Long COVID   Relevant Orders   Ambulatory referral to Pulmonology   Loud snoring   Relevant Orders   Ambulatory referral to Pulmonology   PSG Sleep Study   Need for tetanus, diphtheria, and acellular pertussis (Tdap) vaccine   Psychophysiological insomnia   Relevant Medications   traZODone (DESYREL) 100 MG tablet   Screening for thyroid disorder   Relevant Orders   TSH + free T4   Other Visit Diagnoses     Family history of ASCVD       Relevant Orders   Lipid panel      Return in about 2 months  (around 02/19/2023) for PAP, breast exam, anxiety and depression.    Vonna Kotyk, FNP, have reviewed all documentation for this visit. The documentation on 12/20/22 for the exam, diagnosis, procedures, and orders are all accurate and complete.  Gwyneth Sprout, [place] [phone] (phone) [phone] (fax)  [place]

## 2022-12-20 ENCOUNTER — Encounter: Payer: Self-pay | Admitting: Family Medicine

## 2022-12-20 ENCOUNTER — Ambulatory Visit: Payer: Medicaid Other | Admitting: Family Medicine

## 2022-12-20 VITALS — BP 130/84 | HR 85 | Temp 98.2°F | Resp 24 | Ht 69.0 in | Wt 237.9 lb

## 2022-12-20 DIAGNOSIS — U099 Post covid-19 condition, unspecified: Secondary | ICD-10-CM | POA: Diagnosis not present

## 2022-12-20 DIAGNOSIS — Z1329 Encounter for screening for other suspected endocrine disorder: Secondary | ICD-10-CM | POA: Insufficient documentation

## 2022-12-20 DIAGNOSIS — N921 Excessive and frequent menstruation with irregular cycle: Secondary | ICD-10-CM

## 2022-12-20 DIAGNOSIS — Z7689 Persons encountering health services in other specified circumstances: Secondary | ICD-10-CM | POA: Insufficient documentation

## 2022-12-20 DIAGNOSIS — Z1159 Encounter for screening for other viral diseases: Secondary | ICD-10-CM | POA: Diagnosis not present

## 2022-12-20 DIAGNOSIS — Z23 Encounter for immunization: Secondary | ICD-10-CM | POA: Diagnosis not present

## 2022-12-20 DIAGNOSIS — F339 Major depressive disorder, recurrent, unspecified: Secondary | ICD-10-CM | POA: Diagnosis not present

## 2022-12-20 DIAGNOSIS — Z833 Family history of diabetes mellitus: Secondary | ICD-10-CM | POA: Diagnosis not present

## 2022-12-20 DIAGNOSIS — D229 Melanocytic nevi, unspecified: Secondary | ICD-10-CM

## 2022-12-20 DIAGNOSIS — K529 Noninfective gastroenteritis and colitis, unspecified: Secondary | ICD-10-CM

## 2022-12-20 DIAGNOSIS — Z1321 Encounter for screening for nutritional disorder: Secondary | ICD-10-CM | POA: Diagnosis not present

## 2022-12-20 DIAGNOSIS — F411 Generalized anxiety disorder: Secondary | ICD-10-CM

## 2022-12-20 DIAGNOSIS — Z8249 Family history of ischemic heart disease and other diseases of the circulatory system: Secondary | ICD-10-CM | POA: Insufficient documentation

## 2022-12-20 DIAGNOSIS — F5104 Psychophysiologic insomnia: Secondary | ICD-10-CM | POA: Diagnosis not present

## 2022-12-20 DIAGNOSIS — Z87898 Personal history of other specified conditions: Secondary | ICD-10-CM

## 2022-12-20 DIAGNOSIS — N951 Menopausal and female climacteric states: Secondary | ICD-10-CM | POA: Diagnosis not present

## 2022-12-20 DIAGNOSIS — Z6835 Body mass index (BMI) 35.0-35.9, adult: Secondary | ICD-10-CM | POA: Diagnosis not present

## 2022-12-20 DIAGNOSIS — Z8379 Family history of other diseases of the digestive system: Secondary | ICD-10-CM | POA: Diagnosis not present

## 2022-12-20 DIAGNOSIS — E66812 Obesity, class 2: Secondary | ICD-10-CM | POA: Insufficient documentation

## 2022-12-20 DIAGNOSIS — Z808 Family history of malignant neoplasm of other organs or systems: Secondary | ICD-10-CM | POA: Insufficient documentation

## 2022-12-20 DIAGNOSIS — R0683 Snoring: Secondary | ICD-10-CM | POA: Insufficient documentation

## 2022-12-20 MED ORDER — BUSPIRONE HCL 5 MG PO TABS: 5.0000 mg | ORAL_TABLET | Freq: Three times a day (TID) | ORAL | 11 refills | Status: DC

## 2022-12-20 MED ORDER — TRAZODONE HCL 100 MG PO TABS: 100.0000 mg | ORAL_TABLET | Freq: Every day | ORAL | 11 refills | Status: DC

## 2022-12-20 MED ORDER — CITALOPRAM HYDROBROMIDE 20 MG PO TABS: 20.0000 mg | ORAL_TABLET | Freq: Every day | ORAL | 3 refills | Status: DC

## 2022-12-20 NOTE — Assessment & Plan Note (Signed)
Acute on chronic, reports weepiness, temperature irregularities, weight gain, and heavy/strong periods Will check FSH and LH and refer to GYN Will also check TSH/free T4

## 2022-12-21 LAB — CBC WITH DIFFERENTIAL/PLATELET
Basophils Absolute: 0 10*3/uL (ref 0.0–0.2)
Basos: 0 %
EOS (ABSOLUTE): 0.3 10*3/uL (ref 0.0–0.4)
Eos: 3 %
Hematocrit: 38.1 % (ref 34.0–46.6)
Hemoglobin: 12.9 g/dL (ref 11.1–15.9)
Immature Grans (Abs): 0.1 10*3/uL (ref 0.0–0.1)
Immature Granulocytes: 1 %
Lymphocytes Absolute: 2.5 10*3/uL (ref 0.7–3.1)
Lymphs: 27 %
MCH: 28.7 pg (ref 26.6–33.0)
MCHC: 33.9 g/dL (ref 31.5–35.7)
MCV: 85 fL (ref 79–97)
Monocytes Absolute: 0.6 10*3/uL (ref 0.1–0.9)
Monocytes: 6 %
Neutrophils Absolute: 5.9 10*3/uL (ref 1.4–7.0)
Neutrophils: 63 %
Platelets: 314 10*3/uL (ref 150–450)
RBC: 4.49 x10E6/uL (ref 3.77–5.28)
RDW: 13.4 % (ref 11.7–15.4)
WBC: 9.3 10*3/uL (ref 3.4–10.8)

## 2022-12-21 LAB — LIPID PANEL
Chol/HDL Ratio: 4 ratio (ref 0.0–4.4)
Cholesterol, Total: 174 mg/dL (ref 100–199)
HDL: 43 mg/dL (ref >39)
LDL Chol Calc (NIH): 110 mg/dL — ABNORMAL HIGH (ref 0–99)
Triglycerides: 114 mg/dL (ref 0–149)
VLDL Cholesterol Cal: 21 mg/dL (ref 5–40)

## 2022-12-21 LAB — COMPREHENSIVE METABOLIC PANEL
ALT: 40 IU/L — ABNORMAL HIGH (ref 0–32)
AST: 25 IU/L (ref 0–40)
Albumin/Globulin Ratio: 1.4 (ref 1.2–2.2)
Albumin: 4.3 g/dL (ref 3.9–4.9)
Alkaline Phosphatase: 106 IU/L (ref 44–121)
BUN/Creatinine Ratio: 12 (ref 9–23)
BUN: 8 mg/dL (ref 6–20)
Bilirubin Total: 0.4 mg/dL (ref 0.0–1.2)
CO2: 19 mmol/L — ABNORMAL LOW (ref 20–29)
Calcium: 9.6 mg/dL (ref 8.7–10.2)
Chloride: 105 mmol/L (ref 96–106)
Creatinine, Ser: 0.66 mg/dL (ref 0.57–1.00)
Globulin, Total: 3 g/dL (ref 1.5–4.5)
Glucose: 127 mg/dL — ABNORMAL HIGH (ref 70–99)
Potassium: 3.9 mmol/L (ref 3.5–5.2)
Sodium: 140 mmol/L (ref 134–144)
Total Protein: 7.3 g/dL (ref 6.0–8.5)
eGFR: 114 mL/min/{1.73_m2} (ref >59)

## 2022-12-21 LAB — B12 AND FOLATE PANEL
Folate: 6.8 ng/mL (ref >3.0)
Vitamin B-12: 471 pg/mL (ref 232–1245)

## 2022-12-21 LAB — VITAMIN D 25 HYDROXY (VIT D DEFICIENCY, FRACTURES): Vit D, 25-Hydroxy: 23.2 ng/mL — ABNORMAL LOW (ref 30.0–100.0)

## 2022-12-21 LAB — HEMOGLOBIN A1C
Est. average glucose Bld gHb Est-mCnc: 114 mg/dL
Hgb A1c MFr Bld: 5.6 % (ref 4.8–5.6)

## 2022-12-21 LAB — MAGNESIUM: Magnesium: 2.1 mg/dL (ref 1.6–2.3)

## 2022-12-21 LAB — FSH/LH
FSH: 2.7 m[IU]/mL
LH: 8.8 m[IU]/mL

## 2022-12-21 LAB — HEPATITIS C ANTIBODY: Hep C Virus Ab: NONREACTIVE

## 2022-12-21 LAB — TSH+FREE T4
Free T4: 0.87 ng/dL (ref 0.82–1.77)
TSH: 0.838 u[IU]/mL (ref 0.450–4.500)

## 2022-12-22 NOTE — Assessment & Plan Note (Signed)
Pt notes multiple immediate and extended family members with DM; recommend A1c screening Continue to recommend balanced, lower carb meals. Smaller meal size, adding snacks. Choosing water as drink of choice and increasing purposeful exercise.

## 2022-12-22 NOTE — Assessment & Plan Note (Signed)
Chronic, previously untreated Trial of trazodone to assist

## 2022-12-22 NOTE — Assessment & Plan Note (Signed)
Pt notes her father has Crohn's; and she is concerned regarding her stool patterns which she has previously described as "IBS" however, is seeing more mucous, and complaints of nausea and diarrhea. Refer to GI for assistance.

## 2022-12-22 NOTE — Assessment & Plan Note (Signed)
Chronic, worsening per pt report Body mass index is 35.13 kg/m. Continue to recommend balanced, lower carb meals. Smaller meal size, adding snacks. Choosing water as drink of choice and increasing purposeful exercise.

## 2022-12-22 NOTE — Assessment & Plan Note (Signed)
Patient notes melanoma in her mother; pt also notes some nevi changes. Recommend referral to derm for further care.

## 2022-12-22 NOTE — Assessment & Plan Note (Signed)
Patient has not sought care for herself for many years; prioritizing the care of her 3 children ages 58, 49 and 25. 70 girl, who also has mental health needs and 2 sons.

## 2022-12-22 NOTE — Assessment & Plan Note (Signed)
Low risk screen Treatable, and curable. If left untreated Hep C can lead to cirrhosis and liver failure. Encourage routine testing; recommend repeat testing if risk factors change.  

## 2022-12-22 NOTE — Progress Notes (Signed)
Bad cholesterol is mildly elevated; I continue to recommend diet low in saturated fat and regular exercise - 30 min at least 5 times per week  Vit D is low; recommend start of 5000 IU/daily, available OTC.  Chemistry shows return for ALT. Continue to recommend ETOH restriction and limited use of NSAIDs.  Labs confirm peri-menopause.  Borderline pre-diabetes. Continue to recommend balanced, lower carb meals. Smaller meal size, adding snacks. Choosing water as drink of choice and increasing purposeful exercise.  All other labs normal and stable.  Please let us know if you have any questions.  Thank you, Melanie Whitaker, [address] [place], [place] [address] [phone] (phone) [phone] (fax) [place]

## 2022-12-22 NOTE — Assessment & Plan Note (Signed)
Chronic, previously well controlled with Celexa Recommend restart today with 6-8 week f/u I've explained to her that drugs of the SSRI class can have side effects such as weight gain, sexual dysfunction, insomnia, headache, nausea. These medications are generally effective at alleviating symptoms of anxiety and/or depression. Let me know if significant side effects do occur.

## 2022-12-22 NOTE — Assessment & Plan Note (Signed)
Consented; VIS made available; no immediate side effects following administration; plan to repeat every 10 years or PRN

## 2022-12-22 NOTE — Assessment & Plan Note (Signed)
Patient notes family hx of skin cancer as well as multiple nevi which have been changing in size, shape and color Defer care to dermatology at this time to assist with mgmt given multiple areas of concern with changes and positive family hx in mother

## 2022-12-22 NOTE — Assessment & Plan Note (Signed)
Chronic, worsening Notes that she is a single parent; also, does not have the support of her sister as approximately 8 years ago he sister was sleeping with her then husband and called CPS and had the kids removed from [facility]'s care given the desire to create a new family with the pt's ex husband. Children have now returned; pt took care with [facility] at that time as well as [place]; however, noted to have some SI with use of Lexapro Pt is now prioritizing self care to allow herself to be the sole parent for her children as along as possible

## 2022-12-22 NOTE — Assessment & Plan Note (Addendum)
Chronic, worsening per pt's family reports. She reports her children wake her up as they are complaining that she is snoring and also having periods where she is not breathing

## 2022-12-22 NOTE — Assessment & Plan Note (Signed)
Patient also notes multiple family members with CHF and CAD; will check CBC, CMP and LP at this time. Defer referral to cardiology at this time. However, do recommend referral to pulmonary to r/o [facility]

## 2022-12-22 NOTE — Assessment & Plan Note (Signed)
Recommend screening given borderline HTN and obesity Body mass index is 35.13 kg/m.

## 2022-12-22 NOTE — Assessment & Plan Note (Signed)
Recommend Vitamin screening given obesity as well as mental health concerns

## 2022-12-22 NOTE — Assessment & Plan Note (Signed)
Concern for long COVID with complaints of lack of energy, SOB with DOE Referral to pulm to assist

## 2022-12-22 NOTE — Assessment & Plan Note (Signed)
Report of apnea from her children; refer to pulm for further advise and PSG

## 2022-12-23 ENCOUNTER — Telehealth: Payer: Self-pay | Admitting: *Deleted

## 2022-12-23 NOTE — Telephone Encounter (Signed)
Patient called and notified: Bad cholesterol is mildly elevated; I continue to recommend diet low in saturated fat and regular exercise - 30 min at least 5 times per week   Vit D is low; recommend start of 5000 IU/daily, available OTC.   Chemistry shows return for ALT. Continue to recommend ETOH restriction and limited use of NSAIDs.   Labs confirm peri-menopause.   Borderline pre-diabetes. Continue to recommend balanced, lower carb meals. Smaller meal size, adding snacks. Choosing water as drink of choice and increasing purposeful exercise.   All other labs normal and stable.  Patient states she would like PCP to call in something for heartburn. Patient states she had to take something during her pregnancies(years ago)- but she has tried the OTC and it really does not help. Patient advised she may need to be seen for that- but will send her request.

## 2022-12-24 ENCOUNTER — Other Ambulatory Visit: Payer: Self-pay | Admitting: Family Medicine

## 2022-12-24 MED ORDER — SUCRALFATE 1 G PO TABS: 1.0000 g | ORAL_TABLET | Freq: Three times a day (TID) | ORAL | 0 refills | Status: DC

## 2022-12-25 ENCOUNTER — Other Ambulatory Visit: Payer: Self-pay | Admitting: Family Medicine

## 2022-12-25 MED ORDER — FAMOTIDINE 20 MG PO TABS: 20.0000 mg | ORAL_TABLET | Freq: Two times a day (BID) | ORAL | 1 refills | Status: DC

## 2022-12-25 NOTE — Telephone Encounter (Signed)
Please advise.  Patient states she would like PCP to call in something for heartburn. Patient states she had to take something during her pregnancies(years ago)- but she has tried the OTC and it really does not help. Patient advised she may need to be seen for that- but will send her reques

## 2022-12-26 DIAGNOSIS — H524 Presbyopia: Secondary | ICD-10-CM | POA: Diagnosis not present

## 2023-01-11 ENCOUNTER — Other Ambulatory Visit: Payer: Self-pay | Admitting: Family Medicine

## 2023-01-11 DIAGNOSIS — F5104 Psychophysiologic insomnia: Secondary | ICD-10-CM

## 2023-01-11 DIAGNOSIS — F411 Generalized anxiety disorder: Secondary | ICD-10-CM

## 2023-01-13 NOTE — Telephone Encounter (Signed)
Unable to refill per protocol, Rx request is too soon. Last refill 12/20/22 for 30 days and 11 refills.  Requested Prescriptions  Pending Prescriptions Disp Refills   traZODone (DESYREL) 100 MG tablet [Pharmacy Med Name: TRAZODONE 100 MG TABLET] 90 tablet 4    Sig: TAKE 1 TABLET BY MOUTH EVERYDAY AT BEDTIME     Psychiatry: Antidepressants - Serotonin Modulator Passed - 01/11/2023 12:32 PM      Passed - Completed PHQ-2 or PHQ-9 in the last 360 days      Passed - Valid encounter within last 6 months    Recent Outpatient Visits           3 weeks ago Depression, recurrent [facility])   [place] Gwyneth Sprout, FNP       Future Appointments             In 1 month Gwyneth Sprout, [place], [facility]             busPIRone (BUSPAR) 5 MG tablet [Pharmacy Med Name: BUSPIRONE HCL 5 MG TABLET] 270 tablet 4    Sig: TAKE 1 TABLET BY MOUTH THREE TIMES A DAY     Psychiatry: Anxiolytics/Hypnotics - Non-controlled Passed - 01/11/2023 12:32 PM      Passed - Valid encounter within last 12 months    Recent Outpatient Visits           3 weeks ago Depression, recurrent [facility])   [place] Gwyneth Sprout, FNP       Future Appointments             In 1 month Gwyneth Sprout, [place], [place]

## 2023-01-14 ENCOUNTER — Encounter: Payer: 59 | Admitting: Obstetrics

## 2023-01-14 ENCOUNTER — Ambulatory Visit
Admission: EM | Admit: 2023-01-14 | Discharge: 2023-01-14 | Disposition: A | Discharge status: Home / Self Care | Payer: MEDICAID | Attending: Urgent Care | Admitting: Urgent Care

## 2023-01-14 DIAGNOSIS — J069 Acute upper respiratory infection, unspecified: Secondary | ICD-10-CM | POA: Diagnosis not present

## 2023-01-14 MED ORDER — BENZONATATE 100 MG PO CAPS: ORAL_CAPSULE | ORAL | 0 refills | Status: DC

## 2023-01-14 MED ORDER — HYDROCOD POLI-CHLORPHE POLI ER 10-8 MG/5ML PO SUER: 5.0000 mL | Freq: Two times a day (BID) | ORAL | 0 refills | Status: AC | PRN

## 2023-01-14 NOTE — ED Triage Notes (Signed)
Pt reports HA, congestion, cough x 4 days. Some wheezing. Some body aches, feeling flushed.

## 2023-01-14 NOTE — ED Provider Notes (Addendum)
Roderic Palau    CSN: [id number] Arrival date & time: 01/14/23  0850      History   Chief Complaint Chief Complaint  Patient presents with   Cough    HPI Melanie Whitaker is a 37 y.o. female.    Cough   Presents with headache, nasal congestion, cough x 4 days.  Endorses some wheezing.  Reports body aches and feeling of "flushed".  Over-the-counter medication.  States she is unsure of what medication she can use.  Reports that she has not had a primary care provider until recently who discovered elevated liver enzymes and told to avoid use of NSAIDs.  States she completed flu infection in December and COVID infection last September.  Does not request testing for either.  She is concerned about "catching" bronchitis.  Past Medical History:  Diagnosis Date   Anxiety    Depression    GERD (gastroesophageal reflux disease)    Manic depression ([place])     Patient Active Problem List   Diagnosis Date Noted   Depression, recurrent ([place]) 12/20/2022   GAD (generalized anxiety disorder) 12/20/2022   Establishing care with new doctor, encounter for 12/20/2022   Perimenopausal vasomotor symptoms 12/20/2022   Family history of Crohn's disease 12/20/2022   Family history of heart failure 12/20/2022   Screening for thyroid disorder 12/20/2022   Encounter for vitamin deficiency screening 12/20/2022   Psychophysiological insomnia 12/20/2022   Encounter for hepatitis C screening test for low risk patient 12/20/2022   Family history of diabetes mellitus 12/20/2022   Need for tetanus, diphtheria, and acellular pertussis (Tdap) vaccine 12/20/2022   Atypical nevi 12/20/2022   Family history of melanoma 12/20/2022   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   Loud snoring 12/20/2022   History of apnea 12/20/2022   Class 2 severe obesity due to excess calories with serious comorbidity and body mass index (BMI) of 35.0 to 35.9 in adult [facility]) 12/20/2022    Past Surgical History:  Procedure  Laterality Date   APPENDECTOMY     CHOLECYSTECTOMY     TONSILLECTOMY     TUBAL LIGATION      OB History   No obstetric history on file.      Home Medications    Prior to Admission medications   Medication Sig Start Date End Date Taking? Authorizing Provider  busPIRone (BUSPAR) 5 MG tablet Take 1 tablet (5 mg total) by mouth 3 (three) times daily. 12/20/22  Yes Gwyneth Sprout, FNP  citalopram (CELEXA) 20 MG tablet Take 1 tablet (20 mg total) by mouth daily. 12/20/22  Yes Gwyneth Sprout, FNP  famotidine (PEPCID) 20 MG tablet Take 1 tablet (20 mg total) by mouth 2 (two) times daily. 12/25/22  Yes Gwyneth Sprout, FNP  sucralfate (CARAFATE) 1 g tablet Take 1 tablet (1 g total) by mouth 4 (four) times daily -  with meals and at bedtime. 12/24/22  Yes Gwyneth Sprout, FNP  traZODone (DESYREL) 100 MG tablet Take 1 tablet (100 mg total) by mouth at bedtime. 12/20/22  Yes Gwyneth Sprout, FNP    Family History Family History  Problem Relation Age of Onset   COPD Mother    Uterine cancer Mother    Diabetes Father    COPD Father    Crohn's disease Father    Heart attack Father    Diabetes Daughter    ADD / ADHD Son    Stroke Maternal Aunt    Crohn's disease Maternal Uncle  Diabetes Paternal Uncle    Crohn's disease Paternal Uncle    Colon cancer Maternal Grandmother    Diabetes Maternal Grandfather    Diabetes Paternal Grandmother    Heart failure Paternal Grandmother    Dementia Paternal Grandmother    Heart failure Paternal Grandfather    Lung cancer Paternal Grandfather     Social History Social History   Tobacco Use   Smoking status: Never   Smokeless tobacco: Never  Vaping Use   Vaping Use: Never used  Substance Use Topics   Alcohol use: No   Drug use: Never     Allergies   Lexapro [escitalopram oxalate], Sulfa antibiotics, Penicillins, and Phenergan [promethazine hcl]   Review of Systems Review of Systems  Respiratory:  Positive for cough.      Physical  Exam Triage Vital Signs ED Triage Vitals  Enc Vitals Group     BP 01/14/23 1008 131/84     Pulse Rate 01/14/23 1008 78     Resp 01/14/23 1008 16     Temp 01/14/23 1008 97.8 F (36.6 C)     Temp Source 01/14/23 1008 Oral     SpO2 01/14/23 1008 98 %     Weight --      Height --      Head Circumference --      Peak Flow --      Pain Score 01/14/23 1013 6     Pain Loc --      Pain Edu? --      Excl. in [place]? --    No data found.  Updated Vital Signs BP 131/84 (BP Location: Left Arm)   Pulse 78   Temp 97.8 F (36.6 C) (Oral)   Resp 16   LMP  (LMP Unknown) Comment: typical to go without period. no chance of pregnancy.  SpO2 98%   Visual Acuity Right Eye Distance:   Left Eye Distance:   Bilateral Distance:    Right Eye Near:   Left Eye Near:    Bilateral Near:     Physical Exam Vitals reviewed.  Constitutional:      Appearance: Normal appearance.  Neurological:     Mental Status: She is alert.      UC Treatments / Results  Labs (all labs ordered are listed, but only abnormal results are displayed) Labs Reviewed - No data to display  EKG   Radiology No results found.  Procedures Procedures (including critical care time)  Medications Ordered in UC Medications - No data to display  Initial Impression / Assessment and Plan / UC Course  I have reviewed the triage vital signs and the nursing notes.  Pertinent labs & imaging results that were available during my care of the patient were reviewed by me and considered in my medical decision making (see chart for details).   Patient is afebrile here without recent antipyretics. Satting well on room air. Overall is ill appearing, well hydrated, without respiratory distress. Pulmonary exam is unremarkable.  Lungs CTAB without wheezing, rhonchi, rales.  Mild pharyngeal erythema.  No peritonsillar exudates.  Patient's symptoms are consistent with an acute viral process.  Low suspicion for influenza or COVID.  She is  outside the treatment window for influenza and testing was not offered or requested.  Discussed antibiotic stewardship with the patient and provided education regarding proper use of antibiotics only for bacterial infections.  Recommending use of OTC medication for symptom control.  She is unsure which analgesics/antipyretics she is able to  take given her elevated liver enzymes.  Reviewed the patient's chart where she was told to avoid overuse of NSAID medications due to elevated ALT rather than acetaminophen.  Suggested that she contact her primary care provider for clarification.  Otherwise provided a list of medications to consider, as tolerated.  Provided prescription for benzonatate and Tussionex for control of cough.  Final Clinical Impressions(s) / UC Diagnoses   Final diagnoses:  None   Discharge Instructions   None    ED Prescriptions   None    PDMP not reviewed this encounter.   Rose Phi, [place] 01/14/23 1041    [place], [place] 01/14/23 1048

## 2023-01-14 NOTE — Discharge Instructions (Addendum)
You have been diagnosed with a viral upper respiratory infection based on your symptoms and exam. Viral illnesses cannot be treated with antibiotics - they are self limiting - and you should find your symptoms resolving within a few days. Get plenty of rest and non-caffeinated fluids. Watch for signs of dehydration including reduced urine output and dark colored urine.  We recommend you use over-the-counter medications for symptom control, as recommended by your primary care provider, including acetaminophen (Tylenol), ibuprofen (Advil/Motrin) or naproxen (Aleve) for throat pain, fever, chills or body aches. You may combine use of acetaminophen and ibuprofen/naproxen if needed.  Some patients find an pain-relieving throat spray such as Chloraseptic to be effective.  Also recommend cold/cough medication containing a cough suppressant such as dextromethorphan, as needed. Please note that some cough medications are not recommended if you suffer from hypertension.  Consult with the pharmacist if you are not sure which to choose.  Saline mist spray is helpful for removing excess mucus from your nose.  Room humidifiers are helpful to ease breathing at night. I recommend guaifenesin (Mucinex) with plenty of water throughout the day to help thin and loosen mucus secretions in your respiratory passages.   If advised by your primary care provider, you might also find relief of nasal/sinus congestion symptoms by using a nasal decongestant such as fluticasone (Flonase ) or pseudoephedrine (Sudafed sinus).  You will need to obtain Sudafed from behind the pharmacist counter.  Speak to the pharmacist to verify that you are not duplicating medications with other over-the-counter formulations that you may be using.

## 2023-01-24 DIAGNOSIS — G4733 Obstructive sleep apnea (adult) (pediatric): Secondary | ICD-10-CM | POA: Diagnosis not present

## 2023-02-02 ENCOUNTER — Other Ambulatory Visit: Payer: Self-pay | Admitting: Family Medicine

## 2023-02-07 ENCOUNTER — Institutional Professional Consult (permissible substitution): Payer: Medicaid Other | Admitting: Primary Care

## 2023-02-14 ENCOUNTER — Other Ambulatory Visit (HOSPITAL_COMMUNITY)
Admission: RE | Admit: 2023-02-14 | Discharge: 2023-02-14 | Disposition: A | Discharge status: Home / Self Care | Payer: Medicaid Other | Source: Ambulatory Visit | Attending: Obstetrics | Admitting: Obstetrics

## 2023-02-14 ENCOUNTER — Ambulatory Visit (INDEPENDENT_AMBULATORY_CARE_PROVIDER_SITE_OTHER): Payer: Medicaid Other | Admitting: Licensed Practical Nurse

## 2023-02-14 ENCOUNTER — Encounter: Payer: Self-pay | Admitting: Licensed Practical Nurse

## 2023-02-14 VITALS — BP 120/83 | HR 70 | Ht 69.0 in | Wt 236.8 lb

## 2023-02-14 DIAGNOSIS — N920 Excessive and frequent menstruation with regular cycle: Secondary | ICD-10-CM | POA: Insufficient documentation

## 2023-02-14 DIAGNOSIS — N921 Excessive and frequent menstruation with irregular cycle: Secondary | ICD-10-CM

## 2023-02-14 DIAGNOSIS — F418 Other specified anxiety disorders: Secondary | ICD-10-CM

## 2023-02-14 MED ORDER — NORETHIN ACE-ETH ESTRAD-FE 1-20 MG-MCG PO TABS: 1.0000 | ORAL_TABLET | Freq: Every day | ORAL | 11 refills | Status: DC

## 2023-02-14 NOTE — Progress Notes (Signed)
Chief complaint: menopausal symptoms   HPI:     Cycles:   Ms. Melanie Whitaker is a 37 y.o. No obstetric history on file. who LMP was Patient's last menstrual period was 01/27/2023 (approximate)., presents today for a problem visit.  She complains of  heavy menstrual bleeding  and irregular cycles that has been happening over the last  2-3 years. Cycles are about every 3-4 months, they last for 2 weeks and are heavy-she needs to change her products every 2 hours, she does pass clots. Her cycles are painful. She is not on hormones. She has not been evaluated for this until now, her PCP did do labs and then referred her here. She has never been told that she has PCOS or endometriosis.  Melanie Whitaker would like treatment to stop having heavy cycles, she does not smoke and denies hx of Migraines with aura   Hot flashes: She has also been having night sweats-wakes up with her clothing drenched, she also has hot flashes throughout the day where she will sweat a lot.  Hs tried black cohosh but it made her feel "weird"  Mood: She has experienced swift mood changes, she is either  really up or really down with no in between, She is on Celexa and Buspar which helps a a little. She does not see a therapist but would like too. She is very active for work, works for a [employer]. She admits to being under a lot of stress lately, her 79 y/o daughter has been struggling with mental health issues-she has been in and out of mental health facilities.   Fatigue Fatigue: has been occurring over the last 2-3 years, feels like she would like to sleep all of the time, has trouble sleeping, uses Trazodone which helps.   She is struggling to lose weight   She is sexually active. Has had a tubal ligation On occasion she does have bleeding after IC. Her mother was diagnosed with uterine and ovarian Cancer at age 70, Melanie Whitaker is worried that she has cancer. Has an apt to have a pap next week. Has not had a pap in 10 years d/t  insurance.  Hx of STDs: not reviewed. She is perimenopausal. Her mother was still having cycles when she was diagnosed with the Cancers, her father's sister went through menopause in her 25's   PMHx: She  has a past medical history of Anxiety, Depression, GERD (gastroesophageal reflux disease), and Manic depression (HCC). Also,  has a past surgical history that includes Cholecystectomy; Appendectomy; Tonsillectomy; and Tubal ligation., family history includes ADD / ADHD in her son; COPD in her father and mother; Colon cancer in her maternal grandmother; Crohn's disease in her father, maternal uncle, and paternal uncle; Dementia in her paternal grandmother; Diabetes in her daughter, father, maternal grandfather, paternal grandmother, and paternal uncle; Heart attack in her father; Heart failure in her paternal grandfather and paternal grandmother; Lung cancer in her paternal grandfather; Stroke in her maternal aunt; Uterine cancer in her mother.,  reports that she has never smoked. She has never used smokeless tobacco. She reports that she does not drink alcohol and does not use drugs.  She  Current Outpatient Medications:    benzonatate (TESSALON) 100 MG capsule, Take 1-2 tablets 3 times a day as needed for cough, Disp: 30 capsule, Rfl: 0   busPIRone (BUSPAR) 5 MG tablet, Take 1 tablet (5 mg total) by mouth 3 (three) times daily., Disp: 90 tablet, Rfl: 11   citalopram (  CELEXA) 20 MG tablet, Take 1 tablet (20 mg total) by mouth daily., Disp: 90 tablet, Rfl: 3   famotidine (PEPCID) 20 MG tablet, Take 1 tablet (20 mg total) by mouth 2 (two) times daily., Disp: 180 tablet, Rfl: 1   norethindrone-ethinyl estradiol-FE (JUNEL FE 1/20) 1-20 MG-MCG tablet, Take 1 tablet by mouth daily., Disp: 28 tablet, Rfl: 11   sucralfate (CARAFATE) 1 g tablet, Take 1 tablet (1 g total) by mouth 4 (four) times daily -  with meals and at bedtime., Disp: 120 tablet, Rfl: 0   traZODone (DESYREL) 100 MG tablet, Take 1 tablet  (100 mg total) by mouth at bedtime., Disp: 30 tablet, Rfl: 11  Also, is allergic to lexapro [escitalopram oxalate], sulfa antibiotics, penicillins, and phenergan [promethazine hcl].  ROS see HPI   Objective: BP 120/83   Pulse 70   Ht 5\' 9"  (1.753 m)   Wt 236 lb 12.8 oz (107.4 kg)   LMP 01/27/2023 (Approximate)   BMI 34.97 kg/m  Physical Exam Constitutional:      Appearance: Normal appearance.  Genitourinary:     Vulva normal.     Genitourinary Comments: Bimanual exam: uterus non gravid, non tender, no masses. Adnexa non tender no masses not enlarged  Spec exam: cervix pink, no lesions, physiologic discharge present, no bleeding  Abdominal:     General: Abdomen is flat. There is no distension.     Tenderness: There is no abdominal tenderness.  Neurological:     General: No focal deficit present.     Mental Status: She is alert.  Skin:    General: Skin is warm.  Psychiatric:        Mood and Affect: Mood normal.        Behavior: Behavior normal.        Thought Content: Thought content normal.        Judgment: Judgment normal.     Latest Reference Range & Units 12/20/22 14:42  WBC 3.4 - 10.8 x10E3/uL 9.3  RBC 3.77 - 5.28 x10E6/uL 4.49  Hemoglobin 11.1 - 15.9 g/dL 03.4  HCT 74.2 - 59.5 % 38.1  MCV 79 - 97 fL 85  MCH 26.6 - 33.0 pg 28.7  MCHC 31.5 - 35.7 g/dL 63.8  RDW 75.6 - 43.3 % 13.4  Platelets 150 - 450 x10E3/uL 314    Latest Reference Range & Units 12/20/22 14:42  LH mIU/mL 8.8  FSH mIU/mL 2.7    Latest Reference Range & Units 12/20/22 14:42  Glucose 70 - 99 mg/dL 295 (H)  Hemoglobin J8A 4.8 - 5.6 % 5.6  Est. average glucose Bld gHb Est-mCnc mg/dL 416  (H): Data is abnormally high   Latest Reference Range & Units 12/20/22 14:42  TSH 0.450 - 4.500 uIU/mL 0.838  T4,Free(Direct) 0.82 - 1.77 ng/dL 6.06    ASSESSMENT/PLAN:   heavy menstrual bleeding with irregular cycles   Problem List Items Addressed This Visit   None Visit Diagnoses     Menorrhagia  with irregular cycle    -  Primary   Relevant Medications   norethindrone-ethinyl estradiol-FE (JUNEL FE 1/20) 1-20 MG-MCG tablet   Other Relevant Orders   Cervicovaginal ancillary only   US PELVIS TRANSVAGINAL NON-OB (TV ONLY)       Reviewed many of her symptoms could be related to perimenopause, but there could be other reasons just as depression and stress. Also, people that carry extra weight can have irregular and heavy cycles.  Your labs in march were normal which rules  out other causes for your symptoms.  -Will treat with OCP and RTC in 6 months, reviewed other hormonal options, pt prefer to start with pills.  -Pelvis US offered, accepted.  -Referral to behavioral health made.  -Discussed the importance of exercise and  weight loss, offered referral to Dr Malena Edman , will call if desired.   Carie Caddy, CNM   [facility]  02/14/23  1:11 PM

## 2023-02-17 LAB — CERVICOVAGINAL ANCILLARY ONLY
Bacterial Vaginitis (gardnerella): POSITIVE — AB
Candida Glabrata: NEGATIVE
Candida Vaginitis: NEGATIVE
Chlamydia: NEGATIVE
Comment: NEGATIVE
Comment: NEGATIVE
Comment: NEGATIVE
Comment: NEGATIVE
Comment: NEGATIVE
Comment: NORMAL
Neisseria Gonorrhea: NEGATIVE
Trichomonas: NEGATIVE

## 2023-02-18 ENCOUNTER — Encounter: Payer: Self-pay | Admitting: Licensed Practical Nurse

## 2023-02-18 ENCOUNTER — Other Ambulatory Visit: Payer: Self-pay | Admitting: Licensed Practical Nurse

## 2023-02-18 DIAGNOSIS — N760 Acute vaginitis: Secondary | ICD-10-CM

## 2023-02-18 MED ORDER — METRONIDAZOLE 500 MG PO TABS: 500.0000 mg | ORAL_TABLET | Freq: Two times a day (BID) | ORAL | 0 refills | Status: DC

## 2023-02-18 NOTE — Progress Notes (Signed)
Pt seen in clinic 4/26 reported bleeding after IC, swab shows BV, prescriptions for flagyl sent, pt notified through mychart.  Carie Caddy, CNM  [facility], [facility]  02/18/23  10:53 AM

## 2023-02-20 ENCOUNTER — Other Ambulatory Visit: Payer: Medicaid Other

## 2023-02-21 ENCOUNTER — Other Ambulatory Visit (HOSPITAL_COMMUNITY)
Admission: RE | Admit: 2023-02-21 | Discharge: 2023-02-21 | Disposition: A | Discharge status: Home / Self Care | Payer: Medicaid Other | Source: Ambulatory Visit | Attending: Family Medicine | Admitting: Family Medicine

## 2023-02-21 ENCOUNTER — Encounter: Payer: Self-pay | Admitting: Family Medicine

## 2023-02-21 ENCOUNTER — Ambulatory Visit: Payer: Medicaid Other | Admitting: Family Medicine

## 2023-02-21 VITALS — BP 119/80 | HR 68 | Temp 98.2°F | Resp 15 | Ht 70.0 in | Wt 239.0 lb

## 2023-02-21 DIAGNOSIS — F339 Major depressive disorder, recurrent, unspecified: Secondary | ICD-10-CM

## 2023-02-21 DIAGNOSIS — Z124 Encounter for screening for malignant neoplasm of cervix: Secondary | ICD-10-CM | POA: Diagnosis not present

## 2023-02-21 DIAGNOSIS — F411 Generalized anxiety disorder: Secondary | ICD-10-CM | POA: Diagnosis not present

## 2023-02-21 NOTE — Assessment & Plan Note (Signed)
Chronic, improved Continue medication as previously prescribed Celexa 20 mg with trazodone 100 mg Declines SI or HI

## 2023-02-21 NOTE — Assessment & Plan Note (Signed)
Hx of LEEP at 17 Recent BV Difficult exam d/t body habitus

## 2023-02-21 NOTE — Assessment & Plan Note (Signed)
Chronic, improved Continue buspar 5 mg TID with celexa 20 mg Denies panic attacks or agitation

## 2023-02-21 NOTE — Progress Notes (Signed)
I,J'ya E Hunter,acting as a scribe for Jacky Kindle, FNP.,have documented all relevant documentation on the behalf of Jacky Kindle, FNP,as directed by  Jacky Kindle, FNP while in the presence of Jacky Kindle, FNP.   Established patient visit   Patient: Melanie Whitaker   DOB: 01-11-1986   38 y.o. Female  MRN: [id number] Visit Date: 02/21/2023  Today's healthcare provider: Jacky Kindle, FNP  Re Introduced to nurse practitioner role and practice setting.  All questions answered.  Discussed provider/patient relationship and expectations.  Chief Complaint  Patient presents with   Depression and Anxiety Follow-Up   Gynecologic Exam   Subjective    HPI  Depression, Follow-up  She  was last seen for this 2 weeks ago. Changes made at last visit include continue citalopram (CELEXA) 20 mg, busPIRone (BUSPAR) 5mg , traZODone (DESYREL) 100 mg.    She reports fair compliance with treatment. She is having side effects. Nausea, fatigue/grogginess.   She reports fair tolerance of treatment. Current symptoms include: fatigue She feels she is Unchanged since last visit.     02/21/2023    8:43 AM 12/20/2022    1:57 PM  Depression screen PHQ 2/9  Decreased Interest 1 3  Down, Depressed, Hopeless 2 3  PHQ - 2 Score 3 6  Altered sleeping 1 1  Tired, decreased energy 2 3  Change in appetite 0 1  Feeling bad or failure about yourself  1 1  Trouble concentrating 2 3  Moving slowly or fidgety/restless 0 0  Suicidal thoughts 0 0  PHQ-9 Score 9 15  Difficult doing work/chores Somewhat difficult Not difficult at all    ----------------------------------------------------------------------------------------- Patient presents for PAP screening and breast examination.   Medications: Outpatient Medications Prior to Visit  Medication Sig   benzonatate (TESSALON) 100 MG capsule Take 1-2 tablets 3 times a day as needed for cough   busPIRone (BUSPAR) 5 MG tablet Take 1 tablet (5 mg total) by  mouth 3 (three) times daily.   citalopram (CELEXA) 20 MG tablet Take 1 tablet (20 mg total) by mouth daily.   famotidine (PEPCID) 20 MG tablet Take 1 tablet (20 mg total) by mouth 2 (two) times daily.   metroNIDAZOLE (FLAGYL) 500 MG tablet Take 1 tablet (500 mg total) by mouth 2 (two) times daily.   norethindrone-ethinyl estradiol-FE (JUNEL FE 1/20) 1-20 MG-MCG tablet Take 1 tablet by mouth daily.   sucralfate (CARAFATE) 1 g tablet Take 1 tablet (1 g total) by mouth 4 (four) times daily -  with meals and at bedtime.   traZODone (DESYREL) 100 MG tablet Take 1 tablet (100 mg total) by mouth at bedtime.   No facility-administered medications prior to visit.    Review of Systems    Objective    BP 119/80 (BP Location: Right Arm, Patient Position: Sitting, Cuff Size: Large)   Pulse 68   Temp 98.2 F (36.8 C) (Oral)   Resp 15   Ht 5\' 10"  (1.778 m)   Wt 239 lb (108.4 kg)   LMP 01/27/2023 (Approximate)   SpO2 100%   BMI 34.29 kg/m   Physical Exam Vitals and nursing note reviewed. Exam conducted with a chaperone present.  Constitutional:      General: She is not in acute distress.    Appearance: Normal appearance. She is obese. She is not ill-appearing, toxic-appearing or diaphoretic.  HENT:     Head: Normocephalic and atraumatic.  Cardiovascular:     Rate and  Rhythm: Normal rate and regular rhythm.     Pulses: Normal pulses.     Heart sounds: Normal heart sounds. No murmur heard.    No friction rub. No gallop.  Pulmonary:     Effort: Pulmonary effort is normal. No respiratory distress.     Breath sounds: Normal breath sounds. No stridor. No wheezing, rhonchi or rales.  Chest:     Chest wall: No tenderness.  Abdominal:     General: Bowel sounds are normal.     Palpations: Abdomen is soft.     Hernia: A hernia is present. There is no hernia in the left inguinal area or right inguinal area.  Genitourinary:    General: Normal vulva.     Exam position: Lithotomy position.      Pubic Area: No rash or pubic lice.      Tanner stage (genital): 5.     Labia:        Right: No rash, tenderness, lesion or injury.        Left: No rash, tenderness, lesion or injury.      Vagina: Vaginal discharge present.     Cervix: Normal.     Uterus: Normal.      Adnexa: Right adnexa normal and left adnexa normal.     Comments: Clitoral piercing present without s/s infection  Difficult exam given poor vaginal tone Musculoskeletal:        General: No swelling, tenderness, deformity or signs of injury. Normal range of motion.     Right lower leg: No edema.     Left lower leg: No edema.  Lymphadenopathy:     Lower Body: No right inguinal adenopathy. No left inguinal adenopathy.  Skin:    General: Skin is warm and dry.     Capillary Refill: Capillary refill takes less than 2 seconds.     Coloration: Skin is not jaundiced or pale.     Findings: No bruising, erythema, lesion or rash.  Neurological:     General: No focal deficit present.     Mental Status: She is alert and oriented to person, place, and time. Mental status is at baseline.     Cranial Nerves: No cranial nerve deficit.     Sensory: No sensory deficit.     Motor: No weakness.     Coordination: Coordination normal.  Psychiatric:        Mood and Affect: Mood normal.        Behavior: Behavior normal.        Thought Content: Thought content normal.        Judgment: Judgment normal.    No results found for any visits on 02/21/23.  Assessment & Plan     Problem List Items Addressed This Visit       Other   Depression, recurrent (HCC) - Primary    Chronic, improved Continue medication as previously prescribed Celexa 20 mg with trazodone 100 mg Declines SI or HI      GAD (generalized anxiety disorder)    Chronic, improved Continue buspar 5 mg TID with celexa 20 mg Denies panic attacks or agitation       Screening for cervical cancer    Hx of LEEP at 17 Recent BV Difficult exam d/t body habitus       Relevant Orders   Cytology - PAP   Return in about 6 months (around 08/24/2023) for annual examination.     Leilani Merl, FNP, have reviewed all documentation for this  visit. The documentation on 02/21/23 for the exam, diagnosis, procedures, and orders are all accurate and complete.  Jacky Kindle, FNP  [facility] [phone] (phone) [phone] (fax)  [facility]

## 2023-02-27 ENCOUNTER — Telehealth: Payer: Self-pay | Admitting: Family Medicine

## 2023-02-27 LAB — CYTOLOGY - PAP
Adequacy: ABSENT
Chlamydia: NEGATIVE
Comment: NEGATIVE
Comment: NEGATIVE
Comment: NEGATIVE
Comment: NEGATIVE
Comment: NORMAL
Diagnosis: NEGATIVE
HSV1: NEGATIVE
HSV2: NEGATIVE
High risk HPV: NEGATIVE
Neisseria Gonorrhea: NEGATIVE
Trichomonas: NEGATIVE

## 2023-02-27 NOTE — Telephone Encounter (Signed)
Patient is calling to be notified of her PAP results Please advise CB- [phone]

## 2023-02-27 NOTE — Progress Notes (Signed)
Transformation zone not captured; however, negative cells and negative HPV as well as STIs. Can repeat in 1 year if desired.

## 2023-02-28 ENCOUNTER — Ambulatory Visit (INDEPENDENT_AMBULATORY_CARE_PROVIDER_SITE_OTHER): Payer: Medicaid Other

## 2023-02-28 DIAGNOSIS — N921 Excessive and frequent menstruation with irregular cycle: Secondary | ICD-10-CM

## 2023-02-28 DIAGNOSIS — N939 Abnormal uterine and vaginal bleeding, unspecified: Secondary | ICD-10-CM

## 2023-02-28 NOTE — Telephone Encounter (Signed)
Patient has already been advised 

## 2023-03-03 ENCOUNTER — Encounter: Payer: Self-pay | Admitting: Licensed Practical Nurse

## 2023-03-07 ENCOUNTER — Institutional Professional Consult (permissible substitution): Payer: Medicaid Other | Admitting: Pulmonary Disease

## 2023-03-11 ENCOUNTER — Other Ambulatory Visit: Payer: Self-pay | Admitting: Family Medicine

## 2023-03-11 ENCOUNTER — Encounter: Payer: Self-pay | Admitting: Family Medicine

## 2023-03-11 MED ORDER — CITALOPRAM HYDROBROMIDE 10 MG PO TABS: 10.0000 mg | ORAL_TABLET | Freq: Every day | ORAL | 1 refills | Status: DC

## 2023-03-25 ENCOUNTER — Inpatient Hospital Stay: Payer: MEDICAID | Attending: Oncology

## 2023-03-25 ENCOUNTER — Encounter: Payer: Self-pay | Admitting: Licensed Clinical Social Worker

## 2023-03-25 ENCOUNTER — Inpatient Hospital Stay: Payer: MEDICAID | Admitting: Licensed Clinical Social Worker

## 2023-03-25 DIAGNOSIS — Z8049 Family history of malignant neoplasm of other genital organs: Secondary | ICD-10-CM

## 2023-03-25 DIAGNOSIS — Z800 Family history of malignant neoplasm of digestive organs: Secondary | ICD-10-CM

## 2023-03-25 NOTE — Progress Notes (Addendum)
REFERRING PROVIDER: Self-referred  PRIMARY PROVIDER:  Jacky Kindle, FNP  PRIMARY REASON FOR VISIT:  1. Family history of Lynch syndrome   2. Family history of uterine cancer   3. Family history of colon cancer    I connected with Melanie Whitaker on 1986-04-02 at 3:00 PM EDT by telephone and verified that I am speaking with the correct person using two identifiers.    Patient location: home Provider location: [facility]  HISTORY OF PRESENT ILLNESS:   Melanie Whitaker, a 37 y.o. female, was seen for a [place] cancer genetics consultation due to a family history of cancer and her mother's recent genetic testing that showed an MSH2 mutation.  Melanie Whitaker presents to clinic today to discuss the possibility of a hereditary predisposition to cancer, genetic testing, and to further clarify her future cancer risks, as well as potential cancer risks for family members.   CANCER HISTORY:  Melanie Whitaker is a 37 y.o. female with no personal history of cancer.    RISK FACTORS:  Menarche was at age 71.  First live birth at age 82.  Ovaries intact: yes.  Hysterectomy: no.  Menopausal status: perimenopausal.  Colonoscopy: yes;  13 years ago, normal . Mammogram within the last year: yes.  Past Medical History:  Diagnosis Date   Anxiety    Depression    GERD (gastroesophageal reflux disease)    Manic depression (HCC)     Past Surgical History:  Procedure Laterality Date   APPENDECTOMY     CHOLECYSTECTOMY     TONSILLECTOMY     TUBAL LIGATION      FAMILY HISTORY:  We obtained a detailed, 4-generation family history.  Significant diagnoses are listed below: Family History  Problem Relation Age of Onset   COPD Mother    Uterine cancer Mother    Diabetes Father    COPD Father    Crohn's disease Father    Heart attack Father    Diabetes Daughter    ADD / ADHD Son    Stroke Maternal Aunt    Crohn's disease Maternal Uncle    Diabetes Paternal Uncle    Crohn's disease Paternal Uncle     Colon cancer Maternal Grandmother    Diabetes Maternal Grandfather    Diabetes Paternal Grandmother    Heart failure Paternal Grandmother    Dementia Paternal Grandmother    Heart failure Paternal Grandfather    Lung cancer Paternal Grandfather    Melanie Whitaker has 1 sister. She has 2 sons and 1 daughter. None have had cancer.  Melanie Whitaker mother had uterine cancer at 25 and colon cancer at 64, she recently underwent genetic testing and was found to have a MSH2 mutation (Lynch syndrome). Maternal grandmother also had colon cancer, she passed at 86.   Melanie Whitaker's father died at 4 of heart attack. A paternal aunt had uterine cancer. Paternal grandfather had lung cancer. Paternal great aunt had cancer, unknown type.  Melanie Whitaker is aware of previous family history of genetic testing for hereditary cancer risks. There is no reported Ashkenazi Jewish ancestry. There is no known consanguinity.  GENETIC COUNSELING ASSESSMENT: Melanie Whitaker is a 37 y.o. female with a family history of MSH2+ Lynch syndrome.  We, therefore, discussed and recommended the following at today's visit.   DISCUSSION: We discussed that approximately 10% of cancer is hereditary. We discussed Lynch syndrome, noting possible management changes should she test positive. She has a 50% chance of inheriting this gene mutation from her  mother. Her father's side of the family is not particularly concerning for hereditary cancer. We discussed that testing is beneficial for several reasons including knowing about cancer risks, identifying potential screening and risk-reduction options that may be appropriate, and to understand if other family members could be at risk for cancer and allow them to undergo genetic testing.   We reviewed the characteristics, features and inheritance patterns of hereditary cancer syndromes. We also discussed genetic testing, including the appropriate family members to test, the process of testing, insurance coverage  and turn-around-time for results. We discussed the implications of a negative, positive and/or variant of uncertain significant result. We recommended Melanie Whitaker pursue genetic testing for the known familial MSH2 mutation.  Based on Melanie Whitaker family history of cancer, she meets medical criteria for genetic testing. The testing will be no cost through [facility]'s family variant testing program.  PLAN: After considering the risks, benefits, and limitations, Melanie Whitaker provided informed consent to pursue genetic testing and the blood sample was sent to [facility] for analysis of the MSH2 gene. Results should be available within approximately 2-3 weeks' time, at which point they will be disclosed by telephone to Melanie Whitaker, as will any additional recommendations warranted by these results. Melanie Whitaker will receive a summary of her genetic counseling visit and a copy of her results once available. This information will also be available in Epic.   Melanie Whitaker questions were answered to her satisfaction today. Our contact information was provided should additional questions or concerns arise. Thank you for the referral and allowing [place] to share in the care of your patient.   Lacy Duverney, MS, [facility] Genetic Counselor [email or web address] Phone: [phone]  The patient was seen for a total of 10 minutes in virtual genetic counseling.  Dr. Orlie Dakin was available for discussion regarding this case.   _______________________________________________________________________ For Office Staff:  Number of people involved in session: 1 Was an Intern/ student involved with case: no

## 2023-03-26 NOTE — Progress Notes (Addendum)
    GYNECOLOGY PROGRESS NOTE  Subjective:    Patient ID: MIRYAM MCELHINNEY, female    DOB: 19-Jan-1986, 37 y.o.   MRN: [id number]  HPI  Patient is a 37 y.o. No obstetric history on file. female who presents for surgery consultation and endometrial biopsy. She does not want to do the endometrial biopsy because of family history of uterine cancer in her mother. She would like to discuss surgery options today.  {Common ambulatory SmartLinks:19316}  Review of Systems {ros; complete:30496}   Objective:   There were no vitals taken for this visit. There is no height or weight on file to calculate BMI. General appearance: {general exam:16600} Abdomen: {abdominal exam:16834} Pelvic: {pelvic exam:16852::"cervix normal in appearance","external genitalia normal","no adnexal masses or tenderness","no cervical motion tenderness","rectovaginal septum normal","uterus normal size, shape, and consistency","vagina normal without discharge"} Extremities: {extremity exam:5109} Neurologic: {neuro exam:17854}   Assessment:   No diagnosis found.   Plan:   There are no diagnoses linked to this encounter.    Hildred Laser, MD [facility]

## 2023-03-28 ENCOUNTER — Other Ambulatory Visit (HOSPITAL_COMMUNITY)
Admission: RE | Admit: 2023-03-28 | Discharge: 2023-03-28 | Disposition: A | Discharge status: Home / Self Care | Payer: Medicaid Other | Source: Ambulatory Visit | Attending: Obstetrics and Gynecology | Admitting: Obstetrics and Gynecology

## 2023-03-28 ENCOUNTER — Ambulatory Visit (INDEPENDENT_AMBULATORY_CARE_PROVIDER_SITE_OTHER): Payer: Medicaid Other | Admitting: Obstetrics and Gynecology

## 2023-03-28 ENCOUNTER — Encounter: Payer: Self-pay | Admitting: Obstetrics and Gynecology

## 2023-03-28 VITALS — BP 111/59 | HR 76 | Resp 16 | Ht 69.5 in | Wt 241.3 lb

## 2023-03-28 DIAGNOSIS — N921 Excessive and frequent menstruation with irregular cycle: Secondary | ICD-10-CM | POA: Diagnosis not present

## 2023-03-28 DIAGNOSIS — R232 Flushing: Secondary | ICD-10-CM

## 2023-03-28 DIAGNOSIS — N858 Other specified noninflammatory disorders of uterus: Secondary | ICD-10-CM | POA: Diagnosis not present

## 2023-03-28 DIAGNOSIS — Z01818 Encounter for other preprocedural examination: Secondary | ICD-10-CM | POA: Diagnosis not present

## 2023-03-28 DIAGNOSIS — Z8049 Family history of malignant neoplasm of other genital organs: Secondary | ICD-10-CM

## 2023-03-28 NOTE — Patient Instructions (Signed)
Hysterectomy Information °A hysterectomy is a surgery in which the uterus is removed. The lowest part of the uterus (cervix), which opens into the vagina, may be removed as well. In some cases, the fallopian tubes, the ovaries,  or both the fallopian tubes and the ovaries may also be removed. °This procedure may be done to treat different medical problems. It may also be done to help transgender men feel more masculine. After the procedure, a woman will no longer have menstrual periods and will not be able to become pregnant (sterile). °What are the reasons for a hysterectomy? °There are many reasons why a person might have this procedure. They include: °Persistent, abnormal vaginal bleeding. °Long-term (chronic) pelvic pain or infection. °Endometriosis. This is when the lining of the uterus (endometrium) starts to grow outside the uterus. °Adenomyosis. This is when the endometrium starts to grow in the muscle of the uterus. °Pelvic organ prolapse. This is a condition in which the uterus falls down into the vagina. °Noncancerous growths in the uterus (uterine fibroids) that cause symptoms. °The presence of precancerous cells. °Cervical or uterine cancer. °Sex change. This helps a transgender man complete his female identity. °What are the different types of hysterectomy? °There are three different types of hysterectomy: °Supracervical hysterectomy. In this type, the top part of the uterus is removed, but not the cervix. °Total hysterectomy. In this type, the uterus and cervix are removed. °Radical hysterectomy. In this type, the uterus, the cervix, and the tissue that holds the uterus in place (parametrium) are removed. °What are the different ways a hysterectomy can be performed? °There are many different ways a hysterectomy can be performed, including: °Abdominal hysterectomy. In this type, an incision is made in the abdomen. The uterus is removed through this incision. °Vaginal hysterectomy. In this type, an  incision is made in the vagina. The uterus is removed through this incision. There are no abdominal incisions. °Conventional laparoscopic hysterectomy. In this type, 3 or 4 small incisions are made in the abdomen. A thin, lighted tube with a camera (laparoscope) is inserted into one of the incisions. Other tools are put through the other incisions. The uterus is cut into small pieces. The small pieces are removed through the incisions or the vagina. °Laparoscopically assisted vaginal hysterectomy (LAVH). In this type, 3 or 4 small incisions are made in the abdomen. Part of the surgery is performed laparoscopically and the other part is done vaginally. The uterus is removed through the vagina. °Robot-assisted laparoscopic hysterectomy. In this type, a laparoscope and other tools are inserted into 3 or 4 small incisions in the abdomen. A computer-controlled device is used to give the surgeon a 3D image and to help control the surgical instruments. This allows for more precise movements of surgical instruments. The uterus is cut into small pieces and removed through the incisions or the vagina. °Discuss the options with your health care provider to determine which type is the right one for you. °What are the risks of this surgery? °Generally, this is a safe procedure. However, problems may occur, including: °Bleeding and risk of blood transfusion. Tell your health care provider if you do not want to receive any blood products. °Blood clots in the legs or lung. °Infection. °Damage to nearby structures or organs. °Allergic reactions to medicines. °Having to change to an abdominal hysterectomy after starting a less invasive technique. °What to expect after a hysterectomy °You will be given pain medicine. °You may need to stay in the hospital for 1-2 days   to recover, depending on the type of hysterectomy you had. °You will need to have someone with you for the first 3-5 days after you go home. °You will need to follow up  with your surgeon in 2-4 weeks after surgery to evaluate your progress. °If the ovaries are removed, you will have early menopause symptoms such as hot flashes, night sweats, and insomnia. °If you had a hysterectomy for a problem that was not cancer or a condition that could not lead to cancer, then you no longer need Pap tests. However, even if you no longer need a Pap test, get regular pelvic exams to make sure no other problems are developing. °Questions to ask your health care provider °Is a hysterectomy medically necessary? Do I have other treatment options for my condition? °What are my options for hysterectomy procedure? °What organs and tissues need to be removed? °What are the risks? °What are the benefits? °How long will I need to stay in the hospital after the procedure? °How long will I need to recover at home? °What symptoms can I expect after the procedure? °Summary °A hysterectomy is a surgery in which the uterus is removed. The fallopian tubes, the ovaries, or both may be removed as well. °This procedure may be done to treat different medical problems. It may also be done to complete your female identity during a sex change. °After the procedure, a woman will no longer have a menstrual period and will not be able to become pregnant. °There are three types of hysterectomy. Discuss with your health care provider which options are right for you. °This is a safe procedure, though there are some risks. Risks include infection, bleeding, blood clots, or damage to nearby organs. °This information is not intended to replace advice given to you by your health care provider. Make sure you discuss any questions you have with your health care provider. °Document Revised: 08/06/2019 Document Reviewed: 08/06/2019 °Elsevier Patient Education © 2021 [employer] Inc. ° °

## 2023-03-29 ENCOUNTER — Encounter: Payer: Self-pay | Admitting: Emergency Medicine

## 2023-03-29 ENCOUNTER — Encounter: Payer: Self-pay | Admitting: Obstetrics and Gynecology

## 2023-03-29 ENCOUNTER — Emergency Department: Payer: Medicaid Other

## 2023-03-29 ENCOUNTER — Other Ambulatory Visit: Payer: Self-pay

## 2023-03-29 ENCOUNTER — Emergency Department
Admission: EM | Admit: 2023-03-29 | Discharge: 2023-03-29 | Disposition: A | Discharge status: Home / Self Care | Payer: Medicaid Other | Attending: Emergency Medicine | Admitting: Emergency Medicine

## 2023-03-29 DIAGNOSIS — M545 Low back pain, unspecified: Secondary | ICD-10-CM | POA: Diagnosis present

## 2023-03-29 DIAGNOSIS — S39012A Strain of muscle, fascia and tendon of lower back, initial encounter: Secondary | ICD-10-CM | POA: Diagnosis not present

## 2023-03-29 DIAGNOSIS — R319 Hematuria, unspecified: Secondary | ICD-10-CM | POA: Insufficient documentation

## 2023-03-29 DIAGNOSIS — K760 Fatty (change of) liver, not elsewhere classified: Secondary | ICD-10-CM | POA: Diagnosis not present

## 2023-03-29 DIAGNOSIS — T148XXA Other injury of unspecified body region, initial encounter: Secondary | ICD-10-CM

## 2023-03-29 LAB — URINALYSIS, ROUTINE W REFLEX MICROSCOPIC
Bilirubin Urine: NEGATIVE
Glucose, UA: NEGATIVE mg/dL
Ketones, ur: NEGATIVE mg/dL
Nitrite: NEGATIVE
Protein, ur: NEGATIVE mg/dL
Specific Gravity, Urine: 1.023 (ref 1.005–1.030)
pH: 7 (ref 5.0–8.0)

## 2023-03-29 LAB — BASIC METABOLIC PANEL
Anion gap: 10 (ref 5–15)
BUN: 12 mg/dL (ref 6–20)
CO2: 24 mmol/L (ref 22–32)
Calcium: 9 mg/dL (ref 8.9–10.3)
Chloride: 103 mmol/L (ref 98–111)
Creatinine, Ser: 0.65 mg/dL (ref 0.44–1.00)
GFR, Estimated: >60 mL/min (ref >60)
Glucose, Bld: 78 mg/dL (ref 70–99)
Potassium: 4.4 mmol/L (ref 3.5–5.1)
Sodium: 137 mmol/L (ref 135–145)

## 2023-03-29 LAB — CBC WITH DIFFERENTIAL/PLATELET
Abs Immature Granulocytes: 0.04 10*3/uL (ref 0.00–0.07)
Basophils Absolute: 0 10*3/uL (ref 0.0–0.1)
Basophils Relative: 0 %
Eosinophils Absolute: 0.2 10*3/uL (ref 0.0–0.5)
Eosinophils Relative: 2 %
HCT: 39 % (ref 36.0–46.0)
Hemoglobin: 12.5 g/dL (ref 12.0–15.0)
Immature Granulocytes: 0 %
Lymphocytes Relative: 28 %
Lymphs Abs: 2.7 10*3/uL (ref 0.7–4.0)
MCH: 28.3 pg (ref 26.0–34.0)
MCHC: 32.1 g/dL (ref 30.0–36.0)
MCV: 88.4 fL (ref 80.0–100.0)
Monocytes Absolute: 0.6 10*3/uL (ref 0.1–1.0)
Monocytes Relative: 6 %
Neutro Abs: 6.2 10*3/uL (ref 1.7–7.7)
Neutrophils Relative %: 64 %
Platelets: 309 10*3/uL (ref 150–400)
RBC: 4.41 MIL/uL (ref 3.87–5.11)
RDW: 14 % (ref 11.5–15.5)
WBC: 9.9 10*3/uL (ref 4.0–10.5)
nRBC: 0 % (ref 0.0–0.2)

## 2023-03-29 LAB — POC URINE PREG, ED: Preg Test, Ur: NEGATIVE

## 2023-03-29 MED ORDER — BACLOFEN 10 MG PO TABS: 10.0000 mg | ORAL_TABLET | Freq: Every day | ORAL | 0 refills | Status: AC

## 2023-03-29 MED ORDER — LIDOCAINE 5 % EX PTCH
1.0000 | MEDICATED_PATCH | CUTANEOUS | Status: DC
  Administered 2023-03-29: 1 via TRANSDERMAL
  Filled 2023-03-29: qty 1

## 2023-03-29 MED ORDER — ACETAMINOPHEN 500 MG PO TABS
1000.0000 mg | ORAL_TABLET | Freq: Once | ORAL | Status: AC
  Administered 2023-03-29: 1000 mg via ORAL
  Filled 2023-03-29: qty 2

## 2023-03-29 NOTE — ED Provider Notes (Signed)
[facility] Emergency Department Provider Note     Event Date/Time   First MD Initiated Contact with Patient 03/29/23 1543     (approximate)   History   Back Pain   HPI  Melanie Whitaker is a 37 y.o. female presents to the emergency department for evaluation of low back pain x 3 weeks.  Patient reports      Physical Exam   Triage Vital Signs: ED Triage Vitals  Enc Vitals Group     BP 03/29/23 1455 139/86     Pulse Rate 03/29/23 1455 75     Resp 03/29/23 1455 18     Temp 03/29/23 1455 98.2 F (36.8 C)     Temp src --      SpO2 03/29/23 1455 95 %     Weight 03/29/23 1456 240 lb (108.9 kg)     Height 03/29/23 1456 5' 9.5" (1.765 m)     Head Circumference --      Peak Flow --      Pain Score 03/29/23 1456 10     Pain Loc --      Pain Edu? --      Excl. in GC? --     Most recent vital signs: Vitals:   03/29/23 1455  BP: 139/86  Pulse: 75  Resp: 18  Temp: 98.2 F (36.8 C)  SpO2: 95%    General Awake, no distress. *** {**HEENT NCAT. PERRL. EOMI. No rhinorrhea. Mucous membranes are moist. **} CV:  Good peripheral perfusion. *** RESP:  Normal effort. *** ABD:  No distention. *** {**Other: **}   ED Results / Procedures / Treatments   Labs (all labs ordered are listed, but only abnormal results are displayed) Labs Reviewed - No data to display  EKG  ***  RADIOLOGY  {**I personally viewed and evaluated these images as part of my medical decision making, as well as reviewing the written report by the radiologist.  ED Provider Interpretation: ***  History and physical examination do not warrant a lab work up or imaging at this time. ***  No results found.   PROCEDURES:  Critical Care performed: {CriticalCareYesNo:19197::"Yes, see critical care procedure note(s)","No"}  Procedures   MEDICATIONS ORDERED IN ED: Medications - No data to display   IMPRESSION / MDM / ASSESSMENT AND PLAN / ED COURSE  I reviewed the  triage vital signs and the nursing notes.                              Clinical Course as of 03/29/23 1619  Sat Mar 29, 2023  1552 3 weeks lower back left side. radiations to mid back. Constant pain. Ibuprofen 800 mg. Not helping. Nothing makes better or worse. Worsens to bend over. No injury. No fevers. No blood thinners.  [MH]    Clinical Course User Index [MH] Kern Reap A, PA-C    37 y.o. female presents to the emergency department for evaluation and treatment of ***. See HPI for further details. Vital signs and physical exam are pertinent for ***.   Differential diagnosis includes, but is not limited to ***   {**The patient is on the cardiac monitor to evaluate for evidence of arrhythmia and/or significant heart rate changes.**}  Lab work ordered and reviewed revealing ***   Imaging ordered and reviewed ***   The patient was administered *** resulting in *** of symptoms.  Patient is in satisfactory and  stable condition for discharge and outpatient follow up. Patient will be discharged home with prescriptions for ***. Patient is to follow up with *** as needed or otherwise directed. Patient is given ED precautions to return to the ED for any worsening or new symptoms. Patient verbalizes understanding. All questions and concerns were addressed during ED visit.    Patient's presentation is most consistent with {EM COPA:27473}  FINAL CLINICAL IMPRESSION(S) / ED DIAGNOSES   Final diagnoses:  None     Rx / DC Orders   ED Discharge Orders     None        Note:  This document was prepared using Dragon voice recognition software and may include unintentional dictation errors.

## 2023-03-29 NOTE — ED Triage Notes (Signed)
Pt ambulatory to triage without difficulty with c/o lower back pain that started 2 weeks ago, getting worse.  Pt states pain is worse with movement, especially bending.  Denies known injury.

## 2023-03-29 NOTE — Discharge Instructions (Signed)
Being treated in the ER is only one step in your treatment and safety! Even if you feel better, you still need to go to all suggested follow-up appointments and take medications as directed. This will help you recover and prevent future problems. Make sure to make and go to all appointments, and call your doctor if things are not going as expected.  Take over-the-counter Tylenol or Ibuprofen with food or snacks as needed for pain.Take care and be patient!     

## 2023-04-01 ENCOUNTER — Encounter: Payer: Self-pay | Admitting: Obstetrics and Gynecology

## 2023-04-01 LAB — SURGICAL PATHOLOGY

## 2023-04-02 ENCOUNTER — Encounter: Payer: Self-pay | Admitting: Emergency Medicine

## 2023-04-02 ENCOUNTER — Emergency Department
Admission: EM | Admit: 2023-04-02 | Discharge: 2023-04-02 | Disposition: A | Discharge status: Home / Self Care | Payer: Medicaid Other | Attending: Emergency Medicine | Admitting: Emergency Medicine

## 2023-04-02 ENCOUNTER — Other Ambulatory Visit: Payer: Self-pay

## 2023-04-02 DIAGNOSIS — N939 Abnormal uterine and vaginal bleeding, unspecified: Secondary | ICD-10-CM | POA: Diagnosis not present

## 2023-04-02 DIAGNOSIS — Z8616 Personal history of COVID-19: Secondary | ICD-10-CM | POA: Diagnosis not present

## 2023-04-02 LAB — COMPREHENSIVE METABOLIC PANEL
ALT: 23 U/L (ref 0–44)
AST: 26 U/L (ref 15–41)
Albumin: 3.4 g/dL — ABNORMAL LOW (ref 3.5–5.0)
Alkaline Phosphatase: 75 U/L (ref 38–126)
Anion gap: 10 (ref 5–15)
BUN: 13 mg/dL (ref 6–20)
CO2: 19 mmol/L — ABNORMAL LOW (ref 22–32)
Calcium: 9.1 mg/dL (ref 8.9–10.3)
Chloride: 110 mmol/L (ref 98–111)
Creatinine, Ser: 0.66 mg/dL (ref 0.44–1.00)
GFR, Estimated: >60 mL/min (ref >60)
Glucose, Bld: 119 mg/dL — ABNORMAL HIGH (ref 70–99)
Potassium: 3.8 mmol/L (ref 3.5–5.1)
Sodium: 139 mmol/L (ref 135–145)
Total Bilirubin: <0.1 mg/dL — ABNORMAL LOW (ref 0.3–1.2)
Total Protein: 7.4 g/dL (ref 6.5–8.1)

## 2023-04-02 LAB — CBC WITH DIFFERENTIAL/PLATELET
Abs Immature Granulocytes: 0.05 10*3/uL (ref 0.00–0.07)
Basophils Absolute: 0 10*3/uL (ref 0.0–0.1)
Basophils Relative: 0 %
Eosinophils Absolute: 0.3 10*3/uL (ref 0.0–0.5)
Eosinophils Relative: 3 %
HCT: 37.7 % (ref 36.0–46.0)
Hemoglobin: 11.9 g/dL — ABNORMAL LOW (ref 12.0–15.0)
Immature Granulocytes: 1 %
Lymphocytes Relative: 34 %
Lymphs Abs: 3 10*3/uL (ref 0.7–4.0)
MCH: 28 pg (ref 26.0–34.0)
MCHC: 31.6 g/dL (ref 30.0–36.0)
MCV: 88.7 fL (ref 80.0–100.0)
Monocytes Absolute: 0.5 10*3/uL (ref 0.1–1.0)
Monocytes Relative: 6 %
Neutro Abs: 4.8 10*3/uL (ref 1.7–7.7)
Neutrophils Relative %: 56 %
Platelets: 288 10*3/uL (ref 150–400)
RBC: 4.25 MIL/uL (ref 3.87–5.11)
RDW: 14 % (ref 11.5–15.5)
WBC: 8.7 10*3/uL (ref 4.0–10.5)
nRBC: 0 % (ref 0.0–0.2)

## 2023-04-02 LAB — URINALYSIS, ROUTINE W REFLEX MICROSCOPIC
Bacteria, UA: NONE SEEN
Bilirubin Urine: NEGATIVE
Glucose, UA: NEGATIVE mg/dL
Ketones, ur: NEGATIVE mg/dL
Leukocytes,Ua: NEGATIVE
Nitrite: NEGATIVE
Protein, ur: NEGATIVE mg/dL
RBC / HPF: >50 RBC/hpf (ref 0–5)
Specific Gravity, Urine: 1.027 (ref 1.005–1.030)
pH: 6 (ref 5.0–8.0)

## 2023-04-02 LAB — POC URINE PREG, ED: Preg Test, Ur: NEGATIVE

## 2023-04-02 MED ORDER — NORETHINDRONE ACETATE 5 MG PO TABS: 5.0000 mg | ORAL_TABLET | Freq: Every day | ORAL | 2 refills | Status: DC

## 2023-04-02 MED ORDER — MEDROXYPROGESTERONE ACETATE 10 MG PO TABS
10.0000 mg | ORAL_TABLET | Freq: Every day | ORAL | Status: DC
  Administered 2023-04-02: 10 mg via ORAL
  Filled 2023-04-02: qty 1

## 2023-04-02 MED ORDER — MEDROXYPROGESTERONE ACETATE 10 MG PO TABS: 10.0000 mg | ORAL_TABLET | Freq: Every day | ORAL | Status: DC

## 2023-04-02 MED ORDER — MEDROXYPROGESTERONE ACETATE 10 MG PO TABS: 10.0000 mg | ORAL_TABLET | Freq: Every day | ORAL | 0 refills | Status: DC

## 2023-04-02 NOTE — Discharge Instructions (Addendum)
Start taking the Provera daily the next 9 days.  Please follow-up with Dr. Valentino Saxon to see what you should be taking after the Provera has stopped.  If you are soaking through a pad more frequently than once every 2 hours then please return to the emergency department.

## 2023-04-02 NOTE — ED Provider Notes (Addendum)
[facility] Provider Note    Event Date/Time   First MD Initiated Contact with Patient 04/02/23 0204     (approximate)   History   Vaginal Bleeding   HPI  Melanie Whitaker is a 37 y.o. female with past medical history of abnormal uterine bleeding who presents because of vaginal bleeding.  Patient had endometrial biopsy with Dr. Valentino Saxon on Friday.  She has been bleeding since.  It is about as heavy as a period.  Has been passing some tissue as well.  She is having some lower pelvic cramping which feels typical to when she has bleeding.  Since she started birth control she has been having random bleeding that is unpredictable.  Prior to this was bleeding about every 65 days.  Patient is hoping to get a hysterectomy and that is the eventual plan.  Denies fevers or chills.     Past Medical History:  Diagnosis Date   Anxiety    Depression    GERD (gastroesophageal reflux disease)    Manic depression (HCC)     Patient Active Problem List   Diagnosis Date Noted   Screening for cervical cancer 02/21/2023   Heavy menstrual bleeding 02/14/2023   Depression, recurrent (HCC) 12/20/2022   GAD (generalized anxiety disorder) 12/20/2022   Establishing care with new doctor, encounter for 12/20/2022   Perimenopausal vasomotor symptoms 12/20/2022   Family history of Crohn's disease 12/20/2022   Family history of heart failure 12/20/2022   Screening for thyroid disorder 12/20/2022   Encounter for vitamin deficiency screening 12/20/2022   Psychophysiological insomnia 12/20/2022   Encounter for hepatitis C screening test for low risk patient 12/20/2022   Family history of diabetes mellitus 12/20/2022   Need for tetanus, diphtheria, and acellular pertussis (Tdap) vaccine 12/20/2022   Atypical nevi 12/20/2022   Family history of melanoma 12/20/2022   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   Loud snoring 12/20/2022   History of apnea 12/20/2022   Class 2 severe obesity due to excess  calories with serious comorbidity and body mass index (BMI) of 35.0 to 35.9 in adult [facility]) 12/20/2022     Physical Exam  Triage Vital Signs: ED Triage Vitals  Enc Vitals Group     BP 04/02/23 0114 (!) 152/92     Pulse Rate 04/02/23 0114 88     Resp 04/02/23 0114 20     Temp 04/02/23 0114 98.3 F (36.8 C)     Temp Source 04/02/23 0114 Oral     SpO2 04/02/23 0114 95 %     Weight 04/02/23 0107 240 lb (108.9 kg)     Height 04/02/23 0107 5\' 9"  (1.753 m)     Head Circumference --      Peak Flow --      Pain Score 04/02/23 0107 9     Pain Loc --      Pain Edu? --      Excl. in GC? --     Most recent vital signs: Vitals:   04/02/23 0114  BP: (!) 152/92  Pulse: 88  Resp: 20  Temp: 98.3 F (36.8 C)  SpO2: 95%     General: Awake, no distress.  CV:  Good peripheral perfusion.  Resp:  Normal effort.  Abd:  No distention.  Mild tenderness over the suprapubic region but no guarding Neuro:             Awake, Alert, Oriented x 3  Other:  There is some scant bleeding on the cervix  but no active hemorrhage, no clots or masses   ED Results / Procedures / Treatments  Labs (all labs ordered are listed, but only abnormal results are displayed) Labs Reviewed  CBC WITH DIFFERENTIAL/PLATELET - Abnormal; Notable for the following components:      Result Value   Hemoglobin 11.9 (*)    All other components within normal limits  COMPREHENSIVE METABOLIC PANEL - Abnormal; Notable for the following components:   CO2 19 (*)    Glucose, Bld 119 (*)    Albumin 3.4 (*)    Total Bilirubin <0.1 (*)    All other components within normal limits  URINALYSIS, ROUTINE W REFLEX MICROSCOPIC - Abnormal; Notable for the following components:   Color, Urine YELLOW (*)    APPearance HAZY (*)    Hgb urine dipstick LARGE (*)    All other components within normal limits  POC URINE PREG, ED     EKG     RADIOLOGY    PROCEDURES:  Critical Care performed: No  Procedures    MEDICATIONS  ORDERED IN ED: Medications  medroxyPROGESTERone (PROVERA) tablet 10 mg (has no administration in time range)     IMPRESSION / MDM / ASSESSMENT AND PLAN / ED COURSE  I reviewed the triage vital signs and the nursing notes.                              Patient's presentation is most consistent with acute complicated illness / injury requiring diagnostic workup.  Differential diagnosis includes, but is not limited to, anemia, abnormal uterine bleeding, complication of endometrial biopsy, coagulopathy, hormonal abnormality  Is a 37 year old female who presents with vaginal bleeding.  She has been dealing with abnormal uterine bleeding for some time and is followed by OB/GYN.  She had an endometrial biopsy 4 days ago and has been having bleeding since then.  Describes as about as heavy as a period.  Is associated with some lower abdominal cramping but no fevers.  She is currently on OCPs.  She did message Dr. Valentino Saxon to see what she should do about the bleeding and she recommended she increase her birth control over the next several days however patient does not want to take Ultiva doses of birth control but has not done this. She is hemodynamically stable blood pressure elevated.  She looks well on exam.  She has mild suprapubic tenderness but no significant pelvic or abdominal tenderness.  I like exam she has some scant bleeding from the cervical os but no significant hemorrhage.  No clots or masses.  Hemoglobin is mildly decreased from prior at 11.9 but no significant anemia.  Pregnancy test is negative.  Offered patient the option of starting Provera tonight and providing prescription versus messaging Dr. Valentino Saxon in the morning to see what her preferred method of treatment is.  Patient is interested in starting the Provera so will prescribe 10 days of 10 mg.  She had already planned to stop taking her birth control.  I did discuss the importance of following up with OB/GYN for ongoing management of  her uterine bleeding as she will likely have bleeding after the Provera has stopped.        FINAL CLINICAL IMPRESSION(S) / ED DIAGNOSES   Final diagnoses:  Vaginal bleeding     Rx / DC Orders   ED Discharge Orders          Ordered    medroxyPROGESTERone (PROVERA) 10 MG  tablet  Daily        04/02/23 0303             Note:  This document was prepared using Dragon voice recognition software and may include unintentional dictation errors.   Georga Hacking, MD 04/02/23 0302    Georga Hacking, MD 04/02/23 [phone]

## 2023-04-02 NOTE — ED Triage Notes (Signed)
Patient ambulatory to triage with steady gait, without difficulty or distress noted; pt reports having an endometrial biopsy on Friday by Dr Valentino Saxon; having heavy vag bleeding, passing clots with lower abd pain

## 2023-04-03 ENCOUNTER — Telehealth: Payer: Self-pay | Admitting: Licensed Clinical Social Worker

## 2023-04-03 ENCOUNTER — Encounter: Payer: Self-pay | Admitting: Licensed Clinical Social Worker

## 2023-04-03 ENCOUNTER — Ambulatory Visit: Payer: Self-pay | Admitting: Licensed Clinical Social Worker

## 2023-04-03 DIAGNOSIS — Z1379 Encounter for other screening for genetic and chromosomal anomalies: Secondary | ICD-10-CM

## 2023-04-03 NOTE — Progress Notes (Signed)
HPI:   Ms. Zimbelman was previously seen in the [facility] due to a family history of Lynch syndrome and concerns regarding a hereditary predisposition to cancer. Please refer to our prior cancer genetics clinic note for more information regarding our discussion, assessment and recommendations, at the time. Ms. Pfahl recent genetic test results were disclosed to her, as were recommendations warranted by these results. These results and recommendations are discussed in more detail below.  CANCER HISTORY:  Oncology History   No history exists.    FAMILY HISTORY:  We obtained a detailed, 4-generation family history.  Significant diagnoses are listed below: Family History  Problem Relation Age of Onset   COPD Mother    Uterine cancer Mother 44   Colon cancer Mother 55       MSH2+ Lynch syndrome   Diabetes Father    COPD Father    Crohn's disease Father    Heart attack Father    Colon cancer Maternal Grandmother    Diabetes Maternal Grandfather    Diabetes Paternal Grandmother    Heart failure Paternal Grandmother    Dementia Paternal Grandmother    Heart failure Paternal Grandfather    Lung cancer Paternal Grandfather    Diabetes Daughter    ADD / ADHD Son    Stroke Maternal Aunt    Crohn's disease Maternal Uncle    Diabetes Paternal Uncle    Crohn's disease Paternal Uncle     Ms. Omori has 1 sister. She has 2 sons and 1 daughter. None have had cancer.   Ms. Stockwell mother had uterine cancer at 77 and colon cancer at 33, she recently underwent genetic testing and was found to have a MSH2 mutation (Lynch syndrome). Maternal grandmother also had colon cancer, she passed at 74.    Ms. Muratalla's father died at 110 of heart attack. A paternal aunt had uterine cancer. Paternal grandfather had lung cancer. Paternal great aunt had cancer, unknown type.   Ms. Kuhlman is aware of previous family history of genetic testing for hereditary cancer risks. There is no  reported Ashkenazi Jewish ancestry. There is no known consanguinity.  GENETIC TEST RESULTS:  The [employer] Family Variant Test of MSH2 found no pathogenic mutations.   The test report has been scanned into EPIC and is located under the Molecular Pathology section of the Results Review tab.  A portion of the result report is included below for reference. Genetic testing reported out on 04/03/2023.     We recommended Ms. Brafford pursue testing for the familial hereditary cancer gene mutation called MSH2 [id number]. Ms. Koehler test was normal and did not reveal the familial mutation. We call this result a true negative result because the cancer-causing mutation was identified in Ms. Rhett's family, and she did not inherit it.  Given this negative result, Ms. Greenlaw's chances of developing MSH2-related cancers are the same as they are in the general population.    ADDITIONAL GENETIC TESTING: We discussed with Ms. Giannotti that there are other genes that are associated with increased cancer risk that can be analyzed. Should Ms. Villena wish to pursue additional genetic testing, we are happy to discuss and coordinate this testing at any time.  CANCER SCREENING RECOMMENDATIONS:  Ms. Furtaw test result is considered negative (normal).    An individual's cancer risk and medical management are not determined by genetic test results alone. Overall cancer risk assessment incorporates additional factors, including personal medical history, family history, and any available genetic information  that may result in a personalized plan for cancer prevention and surveillance. Therefore, it is recommended she continue to follow the cancer management and screening guidelines provided by her primary healthcare provider.  RECOMMENDATIONS FOR FAMILY MEMBERS:   Since she did not inherit a identifiable mutation in a cancer predisposition gene included on this panel, her children could not have inherited a known mutation from  her in one of these genes. Individuals in this family might be at some increased risk of developing cancer, over the general population risk, due to the family history of cancer.  Individuals in the family should notify their providers of the family history of cancer. We recommend women in this family have a yearly mammogram beginning at age 11, or 13 years younger than the earliest onset of cancer, an annual clinical breast exam, and perform monthly breast self-exams.  Family members should have colonoscopies by at age 46, or earlier, as recommended by their providers. Other members of the family may still carry a pathogenic variant in one of these genes that Ms. Milroy did not inherit. Based on the family history, we recommend her mother's relatives have genetic counseling and testing. Ms. Amberg will let us know if we can be of any assistance in coordinating genetic counseling and/or testing for these family members.    FOLLOW-UP:  Lastly, we discussed with Ms. Rasnic that cancer genetics is a rapidly advancing field and it is possible that new genetic tests will be appropriate for her and/or her family members in the future. We encouraged her to remain in contact with cancer genetics on an annual basis so we can update her personal and family histories and let her know of advances in cancer genetics that may benefit this family.   Our contact number was provided. Ms. Boutin questions were answered to her satisfaction, and she knows she is welcome to call us at anytime with additional questions or concerns.    Lacy Duverney, MS, [facility] Genetic Counselor [email or web address] Phone: [phone]

## 2023-04-03 NOTE — Telephone Encounter (Signed)
I contacted Melanie Whitaker to discuss her genetic testing results. No pathogenic variants were identified in the MSH2 gene. Detailed clinic note to follow.   The test report has been scanned into EPIC and is located under the Molecular Pathology section of the Results Review tab.  A portion of the result report is included below for reference.      Lacy Duverney, MS, [facility] Genetic Counselor [email or web address] Phone: [phone]

## 2023-04-09 ENCOUNTER — Ambulatory Visit: Payer: Medicaid Other | Admitting: Gastroenterology

## 2023-04-21 NOTE — Addendum Note (Signed)
Addended by: Fabian November on: 04/21/2023 03:46 PM   Modules accepted: Level of Service

## 2023-04-23 ENCOUNTER — Ambulatory Visit (INDEPENDENT_AMBULATORY_CARE_PROVIDER_SITE_OTHER): Payer: Medicaid Other | Admitting: Obstetrics and Gynecology

## 2023-04-23 VITALS — BP 118/74 | HR 82 | Resp 16 | Ht 69.0 in | Wt 239.3 lb

## 2023-04-23 DIAGNOSIS — Z8049 Family history of malignant neoplasm of other genital organs: Secondary | ICD-10-CM

## 2023-04-23 DIAGNOSIS — N921 Excessive and frequent menstruation with irregular cycle: Secondary | ICD-10-CM

## 2023-04-23 DIAGNOSIS — E669 Obesity, unspecified: Secondary | ICD-10-CM | POA: Diagnosis not present

## 2023-04-23 DIAGNOSIS — Z01818 Encounter for other preprocedural examination: Secondary | ICD-10-CM

## 2023-04-23 NOTE — Progress Notes (Unsigned)
GYNECOLOGY PREOPERATIVE HISTORY AND PHYSICAL   Subjective:  Melanie Whitaker is a 37 y.o. [phone] presents for pre-operative exam. She desires surgical management of menorrhagia with irregular cycles for the past 2-3 years.  Cycles are q 3-4 months but last for 2 or more weeks when they occur. They are noted to be heavy, changing her menstrual products every 2 hours with passage of clots. Also noting that her cycles are painful. She has been on OCPs however continues to have irregular bleeding. Desires definitive therapy with hysterectomy not only due to her symptoms but also due to strong family history of reproductive cancers (mother diagnosed with uterine cancer approximately 5 years ago, also with a grandmother that had a history of uterine and colon cancer although unsure which was the primary).  Her mother recently underwent genetic testing and was found to have positive genes implicated in Lynch syndrome.  Patient also recently had testing done due to this, however reports that she has returned negative for Lynch syndrome.  No significant preoperative concerns.  Proposed surgery: Laparoscopic assisted vaginal hysterectomy with bilateral salpingectomy    Pertinent Gynecological History: Menses: See HPI Bleeding: dysfunctional uterine bleeding Contraception: none Last mammogram:  : Not age appropriate   Last pap: normal Date: 02/21/2023   Past Medical History:  Diagnosis Date   Anxiety    Depression    GERD (gastroesophageal reflux disease)    Manic depression (HCC)     Past Surgical History:  Procedure Laterality Date   APPENDECTOMY     CHOLECYSTECTOMY     TONSILLECTOMY     TUBAL LIGATION      OB History  Gravida Para Term Preterm AB Living  3 3 3     3   SAB IAB Ectopic Multiple Live Births               # Outcome Date GA Lbr Len/2nd Weight Sex Delivery Anes PTL Lv  3 Term           2 Term           1 Term             Family History  Problem Relation Age of  Onset   COPD Mother    Uterine cancer Mother 60   Colon cancer Mother 55       MSH2+ Lynch syndrome   Diabetes Father    COPD Father    Crohn's disease Father    Heart attack Father    Colon cancer Maternal Grandmother    Diabetes Maternal Grandfather    Diabetes Paternal Grandmother    Heart failure Paternal Grandmother    Dementia Paternal Grandmother    Heart failure Paternal Grandfather    Lung cancer Paternal Grandfather    Diabetes Daughter    ADD / ADHD Son    Stroke Maternal Aunt    Crohn's disease Maternal Uncle    Diabetes Paternal Uncle    Crohn's disease Paternal Uncle    Social History   Socioeconomic History   Marital status: Single    Spouse name: Not on file   Number of children: 3   Years of education: Not on file   Highest education level: 9th grade  Occupational History   Not on file  Tobacco Use   Smoking status: Never   Smokeless tobacco: Never  Vaping Use   Vaping Use: Never used  Substance and Sexual Activity   Alcohol use: No   Drug use:  Never   Sexual activity: Yes    Comment: tubaligation  Other Topics Concern   Not on file  Social History Narrative   Not on file   Social Determinants of Health   Financial Resource Strain: Low Risk  (02/20/2023)   Overall Financial Resource Strain (CARDIA)    Difficulty of Paying Living Expenses: Not hard at all  Food Insecurity: No Food Insecurity (02/20/2023)   Hunger Vital Sign    Worried About Running Out of Food in the Last Year: Never true    Ran Out of Food in the Last Year: Never true  Transportation Needs: No Transportation Needs (02/20/2023)   PRAPARE - Administrator, Civil Service (Medical): No    Lack of Transportation (Non-Medical): No  Physical Activity: Sufficiently Active (02/20/2023)   Exercise Vital Sign    Days of Exercise per Week: 3 days    Minutes of Exercise per Session: 90 min  Stress: Stress Concern Present (02/20/2023)   [employer] of Occupational Health -  Occupational Stress Questionnaire    Feeling of Stress : Very much  Social Connections: Socially Isolated (02/20/2023)   Social Connection and Isolation Panel [NHANES]    Frequency of Communication with Friends and Family: More than three times a week    Frequency of Social Gatherings with Friends and Family: Twice a week    Attends Religious Services: Never    Database administrator or Organizations: No    Attends Engineer, structural: Not on file    Marital Status: Never married  Intimate Partner Violence: Not At Risk (12/09/2018)   Humiliation, Afraid, Rape, and Kick questionnaire    Fear of Current or Ex-Partner: No    Emotionally Abused: No    Physically Abused: No    Sexually Abused: No   Current Outpatient Medications on File Prior to Visit  Medication Sig Dispense Refill   baclofen (LIORESAL) 10 MG tablet Take 1 tablet (10 mg total) by mouth daily. 30 tablet 0   busPIRone (BUSPAR) 5 MG tablet Take 1 tablet (5 mg total) by mouth 3 (three) times daily. 90 tablet 11   citalopram (CELEXA) 10 MG tablet Take 1 tablet (10 mg total) by mouth daily. Take in addition to 20 mg for total of 30 mg daily. 90 tablet 1   citalopram (CELEXA) 20 MG tablet Take 1 tablet (20 mg total) by mouth daily. 90 tablet 3   famotidine (PEPCID) 20 MG tablet Take 1 tablet (20 mg total) by mouth 2 (two) times daily. 180 tablet 1   medroxyPROGESTERone (PROVERA) 10 MG tablet Take 1 tablet (10 mg total) by mouth daily for 9 days. 9 tablet 0   norethindrone-ethinyl estradiol-FE (JUNEL FE 1/20) 1-20 MG-MCG tablet Take 1 tablet by mouth daily. 28 tablet 11   sucralfate (CARAFATE) 1 g tablet Take 1 tablet (1 g total) by mouth 4 (four) times daily -  with meals and at bedtime. 120 tablet 0   traZODone (DESYREL) 100 MG tablet Take 1 tablet (100 mg total) by mouth at bedtime. 30 tablet 11   No current facility-administered medications on file prior to visit.    Allergies  Allergen Reactions   Lexapro  [Escitalopram Oxalate] Other (See Comments)    SI during use   Sulfa Antibiotics Anaphylaxis   Penicillins    Phenergan [Promethazine Hcl] Other (See Comments)    halluciations      Review of Systems Constitutional: No recent fever/chills/sweats Respiratory: No recent cough/bronchitis Cardiovascular:  No chest pain Gastrointestinal: No recent nausea/vomiting/diarrhea Genitourinary: No UTI symptoms Hematologic/lymphatic:No history of coagulopathy or recent blood thinner use    Objective:   Blood pressure 118/74, pulse 82, resp. rate 16, height 5\' 9"  (1.753 m), weight 239 lb 4.8 oz (108.5 kg). CONSTITUTIONAL: Well-developed, well-nourished female in no acute distress.  HENT:  Normocephalic, atraumatic, External right and left ear normal. Oropharynx is clear and moist EYES: Conjunctivae and EOM are normal. Pupils are equal, round, and reactive to light. No scleral icterus.  NECK: Normal range of motion, supple, no masses SKIN: Skin is warm and dry. No rash noted. Not diaphoretic. No erythema. No pallor. NEUROLOGIC: Alert and oriented to person, place, and time. Normal reflexes, muscle tone coordination. No cranial nerve deficit noted. PSYCHIATRIC: Normal mood and affect. Normal behavior. Normal judgment and thought content. CARDIOVASCULAR: Normal heart rate noted, regular rhythm RESPIRATORY: Effort and breath sounds normal, no problems with respiration noted ABDOMEN: Soft, nontender, nondistended. PELVIC: Deferred MUSCULOSKELETAL: Normal range of motion. No edema and no tenderness. 2+ distal pulses.    Labs: Lab Results  Component Value Date   WBC 8.7 04/02/2023   HGB 11.9 (L) 04/02/2023   HCT 37.7 04/02/2023   MCV 88.7 04/02/2023   PLT 288 04/02/2023    Pathology (03/28/2023):  ENDOMETRIUM, BIOPSY:       Benign endometrium with inactive glands and stromal  pseudodecidualization consistent with exogenous progestin effect.       Negative for hyperplasia, atypia or  malignancy.     Imaging Studies: CT Renal Stone Study  Result Date: 03/29/2023 CLINICAL DATA:  Abdominal and flank pain. EXAM: CT ABDOMEN AND PELVIS WITHOUT CONTRAST TECHNIQUE: Multidetector CT imaging of the abdomen and pelvis was performed following the standard protocol without IV contrast. RADIATION DOSE REDUCTION: This exam was performed according to the departmental dose-optimization program which includes automated exposure control, adjustment of the mA and/or kV according to patient size and/or use of iterative reconstruction technique. COMPARISON:  05/22/2010 FINDINGS: Lower chest: Unremarkable. Hepatobiliary: The liver shows diffusely decreased attenuation suggesting fat deposition. Visualized portion of the liver parenchyma shows no focal abnormality on this noncontrast study. Gallbladder is surgically absent. No intrahepatic or extrahepatic biliary dilation. Pancreas: No focal mass lesion. No dilatation of the main duct. No intraparenchymal cyst. No peripancreatic edema. Spleen: No splenomegaly. No focal mass lesion. Adrenals/Urinary Tract: No adrenal nodule or mass. Kidneys unremarkable. No evidence for hydroureter. The urinary bladder appears normal for the degree of distention. Stomach/Bowel: Stomach is unremarkable. No gastric wall thickening. No evidence of outlet obstruction. Duodenum is normally positioned as is the ligament of Treitz. No small bowel wall thickening. No small bowel dilatation. The terminal ileum is normal. The appendix is not well visualized, but there is no edema or inflammation in the region of the cecum. No gross colonic mass. No colonic wall thickening. Vascular/Lymphatic: No abdominal aortic aneurysm. No abdominal aortic atherosclerotic calcification. There is no gastrohepatic or hepatoduodenal ligament lymphadenopathy. No retroperitoneal or mesenteric lymphadenopathy. No pelvic sidewall lymphadenopathy. Reproductive: Unremarkable. Other: No intraperitoneal free fluid.  Musculoskeletal: No worrisome lytic or sclerotic osseous abnormality. IMPRESSION: 1. No acute findings in the abdomen or pelvis. No urinary stone disease. No secondary changes in either kidney or ureter. 2. Hepatic steatosis. Electronically Signed   By: Kennith Center M.D.   On: 03/29/2023 17:11      ULTRASOUND REPORT   Location: [facility] Date of Service: 02/28/2023        Indications:Abnormal Uterine Bleeding - fam  hx of uterine cx Findings:  The uterus is anteverted and measures 9.25 x 6.07 x 5.16. Echo texture is homogenous without evidence of focal masses.     The Endometrium measures 12.37 mm. No definitive polyp seen, endo thick near fundus and thins within LUS   Right Ovary measures 2.87 x 1.90 x 1.75 cm. It is normal in appearance. Left Ovary not well seen  Survey of the adnexa demonstrates no adnexal masses. There is no free fluid in the cul de sac.   Impression: 1. Thicken endometrium within fundus   Recommendations: 1.Clinical correlation with the patient's History and Physical Exam. 2. Follow up with provider for recommendations   Waldo Laine, RT     I have reviewed this study and agree with documented findings.      Hildred Laser, MD [facility]   Assessment:    Menorrhagia with irregular cycle Family history of uterine cancer Obesity, BMI 35  Plan:   - Patient desires definitive management with hysterectomy.  I proposed doing a laparoscopic-assisted vaginal hysterectomy (LAVH) and prophylactic bilateral salpingectomy.  No indication for oophorectomy.  Patient agrees with this proposed surgery.  The risks of surgery were discussed in detail with the patient including but not limited to: bleeding which may require transfusion or reoperation; infection which may require antibiotics; injury to bowel, bladder, ureters or other surrounding organs; need for additional procedures including laparotomy or subsequent procedures secondary  to abnormal pathology; formation of adhesions; thromboembolic phenomenon; incisional problems and other postoperative/anesthesia complications.  Patient was also advised that she will be discharged home after surgery; and expected recovery time after a hysterectomy is 6-8 weeks.  Patient was told that the likelihood that her condition and symptoms will be treated effectively with this surgical management was very high; the postoperative expectations were also discussed in detail. The patient also understands the alternative treatment options which were discussed in full. All questions were answered.  She was told that she will be contacted by our surgical scheduler regarding the time and date of her surgery; routine preoperative instructions will be given to her by the preoperative nursing team.  Routine postoperative instructions will be reviewed with the patient in detail after surgery.  Patient education about the procedure was given to the patient in AVS to review at home. - Preop testing ordered. - Instructions reviewed, including NPO after midnight.      Hildred Laser, MD [facility]

## 2023-04-24 ENCOUNTER — Encounter: Payer: Self-pay | Admitting: Obstetrics and Gynecology

## 2023-04-24 NOTE — Patient Instructions (Signed)
GYNECOLOGY PRE-OPERATIVE INSTRUCTIONS  You are scheduled for surgery on 05/05/2023.  The name of your procedure is: Laparoscopic assisted vaginal hysterectomy with bilateral salpingectomy (removal of uterus with cervix and fallopian tubes).    Please read through these instructions carefully regarding preparation for your surgery:   Nothing to eat after midnight on the day prior to surgery.  Do not take any medications unless recommended by your provider on day prior to surgery.  Do not take NSAIDs (Motrin, Aleve) or aspirin 7 days prior to surgery.  You may take Tylenol products for minor aches and pains.  You will receive a prescription for pain medications post-operatively.  You will be contacted by phone approximately 1 week prior to surgery to schedule your pre-operative appointment.  You will need someone to drive you to the hospital and be able to drive you home.  You will not be allowed to drive within 24 hours after surgery. Please call the office if you have any questions regarding your upcoming surgery.    Thank you for choosing  OB/GYN.     Laparoscopically Assisted Vaginal Hysterectomy A laparoscopically assisted vaginal hysterectomy (LAVH) is a surgical procedure to remove the uterus and cervix. Sometimes, the ovaries and fallopian tubes are also removed. This surgery may be done to treat problems such as: Noncancerous growths in the uterus (uterine fibroids) that cause symptoms. A condition that causes the lining of the uterus to grow in other areas (endometriosis). Problems with pelvic support. Cancer of the cervix, ovaries, uterus, or tissue that lines the uterus (endometrium). Excessive bleeding in the uterus. During an LAVH, some of the surgical removal is done through the vagina, and the rest is done through a few small incisions in the abdomen. This technique may be an option for women who are not able to have a traditional vaginal hysterectomy. After this  procedure, you will no longer be able to have a baby, and you will no longer have a menstrual period. Tell a health care provider about: Any allergies you have. All medicines you are taking, including vitamins, herbs, eye drops, creams, and over-the-counter medicines. Any problems you or family members have had with anesthetic medicines. Any blood disorders you have. Any surgeries you have had. Any medical conditions you have. Whether you are pregnant or may be pregnant. What are the risks? Generally, this is a safe procedure. However, problems may occur, including: Bleeding. Infection. Blood clots in the legs or lungs. Having to change from small incisions to open abdominal surgery. Allergic reactions to medicines. Damage to other structures or organs. What happens before the procedure? Staying hydrated Follow instructions from your health care provider about hydration, which may include: Up to 2 hours before the procedure - you may continue to drink clear liquids, such as water, clear fruit juice, black coffee, and plain tea.  Eating and drinking restrictions Follow instructions from your health care provider about eating and drinking, which may include: 8 hours before the procedure - stop eating heavy meals or foods, such as meat, fried foods, or fatty foods. 6 hours before the procedure - stop eating light meals or foods, such as toast or cereal. 6 hours before the procedure - stop drinking milk or drinks that contain milk. 2 hours before the procedure - stop drinking clear liquids. Medicines Take over-the-counter and prescription medicines only as told by your health care provider. You may be asked to take a medicine to empty your colon (bowel preparation). General instructions If you were asked  to do bowel preparation before the procedure, follow instructions from your health care provider. This procedure can affect the way you feel about yourself. Talk with your health care  provider about the physical and emotional changes hysterectomy may cause. Do not use any products that contain nicotine or tobacco for at least 4 weeks before the procedure. These products include cigarettes, chewing tobacco, and vaping devices, such as e-cigarettes. If you need help quitting, ask your health care provider. Plan to have a responsible adult take you home from the hospital or clinic. Plan to have a responsible adult care for you for the time you are told after you leave the hospital or clinic. This is important. Surgery safety Ask your health care provider: How your surgery site will be marked. What steps will be taken to help prevent infection. These steps may include: Removing hair at the surgery site. Washing skin with a germ-killing soap. Receiving antibiotic medicine. What happens during the procedure? An IV will be inserted into one of your veins. You may be given: A medicine to help you relax (sedative). A medicine to make you fall asleep (general anesthetic). A medicine to numb the area (local anesthetic). You may have a flexible tube (catheter) put into your bladder to drain urine. Tight-fitting (compression) stockings will be placed on your legs to promote circulation. Three or four small incisions will be made in your abdomen. An incision will also be made in your vagina. Surgical instruments will be inserted into the small incisions. The uterus and cervix, and possibly the ovaries and fallopian tubes, will be removed through your vagina and through the small incisions in the abdomen. The incisions will then be closed with stitches (sutures), skin glue, or adhesive strips. The procedure may vary among health care providers and hospitals. What happens after the procedure? Your blood pressure, heart rate, breathing rate, and blood oxygen level will be monitored until you leave the hospital or clinic. You will be given pain medicine as needed. You will most likely  return to your usual diet the day after surgery. You may still have the urinary catheter in place for several hours. It will likely be removed the day after surgery. You may have to wear compression stockings. These stockings help to prevent blood clots and reduce swelling in your legs. You will be encouraged to walk as soon as possible. You will also use a device or do breathing exercises to keep your lungs clear. You will need to wear a sanitary pad for vaginal discharge or bleeding. Summary A laparoscopically assisted vaginal hysterectomy (LAVH) is a surgical procedure to remove the uterus and cervix, and sometimes the ovaries and fallopian tubes. Follow instructions from your health care provider about eating and drinking before the procedure. During an LAVH, some of the surgical removal is done through the vagina, and the rest is done through a few small incisions in the abdomen. After the procedure you will be given pain medication as needed and will need to wear a sanitary pad. Plan to have a responsible adult take you home from the hospital or clinic. This information is not intended to replace advice given to you by your health care provider. Make sure you discuss any questions you have with your health care provider. Document Revised: 12/19/2021 Document Reviewed: 06/09/2020 Elsevier Patient Education  2024 [employer].

## 2023-04-24 NOTE — Addendum Note (Signed)
Addended by: Fabian November on: 04/24/2023 03:02 PM   Modules accepted: Orders

## 2023-04-25 ENCOUNTER — Institutional Professional Consult (permissible substitution): Payer: Medicaid Other | Admitting: Pulmonary Disease

## 2023-05-01 ENCOUNTER — Other Ambulatory Visit: Payer: Self-pay

## 2023-05-01 ENCOUNTER — Encounter
Admission: RE | Admit: 2023-05-01 | Discharge: 2023-05-01 | Disposition: A | Discharge status: Home / Self Care | Payer: Medicaid Other | Source: Ambulatory Visit | Attending: Obstetrics and Gynecology | Admitting: Obstetrics and Gynecology

## 2023-05-01 DIAGNOSIS — Z01812 Encounter for preprocedural laboratory examination: Secondary | ICD-10-CM

## 2023-05-01 HISTORY — DX: Pneumonia, unspecified organism: J18.9

## 2023-05-01 HISTORY — DX: Anemia, unspecified: D64.9

## 2023-05-01 NOTE — Patient Instructions (Addendum)
Your procedure is scheduled on: 05/05/23 - Monday Report to the Registration Desk on the 1st floor of [facility]. To find out your arrival time, please call [phone] between 1PM - 3PM on: 05/02/23 - Friday If your arrival time is 6:00 am, do not arrive before that time as the [facility] entrance doors do not open until 6:00 am.  REMEMBER: Instructions that are not followed completely may result in serious medical risk, up to and including death; or upon the discretion of your surgeon and anesthesiologist your surgery may need to be rescheduled.  Do not eat food or drink any liquids after midnight the night before surgery.  No gum chewing or hard candies.   One week prior to surgery: Stop Anti-inflammatories (NSAIDS) such as Advil, Aleve, Ibuprofen, Motrin, Naproxen, Naprosyn and Aspirin based products such as Excedrin, Goody's Powder, BC Powder. You may take Tylenol if needed for pain up until the day of surgery.  Stop ANY OVER THE COUNTER supplements until after surgery.   TAKE ONLY THESE MEDICATIONS THE MORNING OF SURGERY WITH A SIP OF WATER:  famotidine (PEPCID) - (take one the night before and one on the morning of surgery - helps to prevent nausea after surgery.) busPIRone (BUSPAR)   No Alcohol for 24 hours before or after surgery.  No Smoking including e-cigarettes for 24 hours before surgery.  No chewable tobacco products for at least 6 hours before surgery.  No nicotine patches on the day of surgery.  Do not use any "recreational" drugs for at least a week (preferably 2 weeks) before your surgery.  Please be advised that the combination of cocaine and anesthesia may have negative outcomes, up to and including death. If you test positive for cocaine, your surgery will be cancelled.  On the morning of surgery brush your teeth with toothpaste and water, you may rinse your mouth with mouthwash if you wish. Do not swallow any toothpaste or mouthwash.  Use CHG Soap  or wipes as directed on instruction sheet.  Do not wear jewelry, make-up, hairpins, clips or nail polish.  Do not wear lotions, powders, or perfumes.   Do not shave body hair from the neck down 48 hours before surgery.  Contact lenses, hearing aids and dentures may not be worn into surgery.  Do not bring valuables to the hospital. [facility] is not responsible for any missing/lost belongings or valuables.   Notify your doctor if there is any change in your medical condition (cold, fever, infection).  Wear comfortable clothing (specific to your surgery type) to the hospital.  After surgery, you can help prevent lung complications by doing breathing exercises.  Take deep breaths and cough every 1-2 hours. Your doctor may order a device called an Incentive Spirometer to help you take deep breaths. When coughing or sneezing, hold a pillow firmly against your incision with both hands. This is called "splinting." Doing this helps protect your incision. It also decreases belly discomfort.  If you are being admitted to the hospital overnight, leave your suitcase in the car. After surgery it may be brought to your room.  In case of increased patient census, it may be necessary for you, the patient, to continue your postoperative care in the Same Day Surgery department.  If you are being discharged the day of surgery, you will not be allowed to drive home. You will need a responsible individual to drive you home and stay with you for 24 hours after surgery.   If  you are taking public transportation, you will need to have a responsible individual with you.  Please call the Pre-admissions Testing Dept. at [phone] if you have any questions about these instructions.  Surgery Visitation Policy:  Patients having surgery or a procedure may have two visitors.  Children under the age of 61 must have an adult with them who is not the patient.  Inpatient Visitation:    Visiting hours are 7  a.m. to 8 p.m. Up to four visitors are allowed at one time in a patient room. The visitors may rotate out with other people during the day.  One visitor age 85 or older may stay with the patient overnight and must be in the room by 8 p.m.    Preparing for Surgery with CHLORHEXIDINE GLUCONATE (CHG) Soap  Chlorhexidine Gluconate (CHG) Soap  o An antiseptic cleaner that kills germs and bonds with the skin to continue killing germs even after washing  o Used for showering the night before surgery and morning of surgery  Before surgery, you can play an important role by reducing the number of germs on your skin.  CHG (Chlorhexidine gluconate) soap is an antiseptic cleanser which kills germs and bonds with the skin to continue killing germs even after washing.  Please do not use if you have an allergy to CHG or antibacterial soaps. If your skin becomes reddened/irritated stop using the CHG.  1. Shower the NIGHT BEFORE SURGERY and the MORNING OF SURGERY with CHG soap.  2. If you choose to wash your hair, wash your hair first as usual with your normal shampoo.  3. After shampooing, rinse your hair and body thoroughly to remove the shampoo.  4. Use CHG as you would any other liquid soap. You can apply CHG directly to the skin and wash gently with a scrungie or a clean washcloth.  5. Apply the CHG soap to your body only from the neck down. Do not use on open wounds or open sores. Avoid contact with your eyes, ears, mouth, and genitals (private parts). Wash face and genitals (private parts) with your normal soap.  6. Wash thoroughly, paying special attention to the area where your surgery will be performed.  7. Thoroughly rinse your body with warm water.  8. Do not shower/wash with your normal soap after using and rinsing off the CHG soap.  9. Pat yourself dry with a clean towel.  10. Wear clean pajamas to bed the night before surgery.  12. Place clean sheets on your bed the night of your  first shower and do not sleep with pets.  13. Shower again with the CHG soap on the day of surgery prior to arriving at the hospital.  14. Do not apply any deodorants/lotions/powders.  15. Please wear clean clothes to the hospital.

## 2023-05-02 ENCOUNTER — Encounter
Admission: RE | Admit: 2023-05-02 | Discharge: 2023-05-02 | Disposition: A | Discharge status: Home / Self Care | Payer: Medicaid Other | Source: Ambulatory Visit | Attending: Obstetrics and Gynecology | Admitting: Obstetrics and Gynecology

## 2023-05-02 DIAGNOSIS — Z01812 Encounter for preprocedural laboratory examination: Secondary | ICD-10-CM | POA: Diagnosis not present

## 2023-05-02 DIAGNOSIS — Z01818 Encounter for other preprocedural examination: Secondary | ICD-10-CM

## 2023-05-02 LAB — COMPREHENSIVE METABOLIC PANEL
ALT: 26 U/L (ref 0–44)
AST: 20 U/L (ref 15–41)
Albumin: 3.6 g/dL (ref 3.5–5.0)
Alkaline Phosphatase: 82 U/L (ref 38–126)
Anion gap: 6 (ref 5–15)
BUN: 11 mg/dL (ref 6–20)
CO2: 24 mmol/L (ref 22–32)
Calcium: 9.1 mg/dL (ref 8.9–10.3)
Chloride: 108 mmol/L (ref 98–111)
Creatinine, Ser: 0.6 mg/dL (ref 0.44–1.00)
GFR, Estimated: >60 mL/min (ref >60)
Glucose, Bld: 92 mg/dL (ref 70–99)
Potassium: 4.1 mmol/L (ref 3.5–5.1)
Sodium: 138 mmol/L (ref 135–145)
Total Bilirubin: 0.6 mg/dL (ref 0.3–1.2)
Total Protein: 7.2 g/dL (ref 6.5–8.1)

## 2023-05-02 LAB — CBC
HCT: 37.3 % (ref 36.0–46.0)
Hemoglobin: 12.3 g/dL (ref 12.0–15.0)
MCH: 28.2 pg (ref 26.0–34.0)
MCHC: 33 g/dL (ref 30.0–36.0)
MCV: 85.6 fL (ref 80.0–100.0)
Platelets: 271 10*3/uL (ref 150–400)
RBC: 4.36 MIL/uL (ref 3.87–5.11)
RDW: 14.4 % (ref 11.5–15.5)
WBC: 8.6 10*3/uL (ref 4.0–10.5)
nRBC: 0 % (ref 0.0–0.2)

## 2023-05-02 LAB — TYPE AND SCREEN
ABO/RH(D): O POS
Antibody Screen: NEGATIVE

## 2023-05-05 ENCOUNTER — Encounter: Payer: Self-pay | Admitting: Obstetrics and Gynecology

## 2023-05-05 ENCOUNTER — Other Ambulatory Visit: Payer: Self-pay

## 2023-05-05 ENCOUNTER — Ambulatory Visit
Admission: RE | Admit: 2023-05-05 | Discharge: 2023-05-05 | Disposition: A | Discharge status: Home / Self Care | Payer: Medicaid Other | Source: Ambulatory Visit | Attending: Obstetrics and Gynecology | Admitting: Obstetrics and Gynecology

## 2023-05-05 ENCOUNTER — Encounter
Admission: RE | Disposition: A | Discharge status: Home / Self Care | Payer: Self-pay | Source: Ambulatory Visit | Attending: Obstetrics and Gynecology

## 2023-05-05 ENCOUNTER — Ambulatory Visit: Payer: Medicaid Other | Admitting: Urgent Care

## 2023-05-05 DIAGNOSIS — N879 Dysplasia of cervix uteri, unspecified: Secondary | ICD-10-CM | POA: Diagnosis not present

## 2023-05-05 DIAGNOSIS — F319 Bipolar disorder, unspecified: Secondary | ICD-10-CM | POA: Insufficient documentation

## 2023-05-05 DIAGNOSIS — K219 Gastro-esophageal reflux disease without esophagitis: Secondary | ICD-10-CM | POA: Insufficient documentation

## 2023-05-05 DIAGNOSIS — E669 Obesity, unspecified: Secondary | ICD-10-CM | POA: Insufficient documentation

## 2023-05-05 DIAGNOSIS — Z09 Encounter for follow-up examination after completed treatment for conditions other than malignant neoplasm: Secondary | ICD-10-CM | POA: Diagnosis not present

## 2023-05-05 DIAGNOSIS — Z8049 Family history of malignant neoplasm of other genital organs: Secondary | ICD-10-CM | POA: Insufficient documentation

## 2023-05-05 DIAGNOSIS — Z90710 Acquired absence of both cervix and uterus: Secondary | ICD-10-CM

## 2023-05-05 DIAGNOSIS — F419 Anxiety disorder, unspecified: Secondary | ICD-10-CM | POA: Insufficient documentation

## 2023-05-05 DIAGNOSIS — Z6835 Body mass index (BMI) 35.0-35.9, adult: Secondary | ICD-10-CM | POA: Diagnosis not present

## 2023-05-05 DIAGNOSIS — Z8616 Personal history of COVID-19: Secondary | ICD-10-CM | POA: Insufficient documentation

## 2023-05-05 DIAGNOSIS — N920 Excessive and frequent menstruation with regular cycle: Secondary | ICD-10-CM | POA: Diagnosis not present

## 2023-05-05 DIAGNOSIS — N72 Inflammatory disease of cervix uteri: Secondary | ICD-10-CM | POA: Insufficient documentation

## 2023-05-05 DIAGNOSIS — N921 Excessive and frequent menstruation with irregular cycle: Secondary | ICD-10-CM | POA: Diagnosis not present

## 2023-05-05 DIAGNOSIS — Z01818 Encounter for other preprocedural examination: Secondary | ICD-10-CM

## 2023-05-05 DIAGNOSIS — Z862 Personal history of diseases of the blood and blood-forming organs and certain disorders involving the immune mechanism: Secondary | ICD-10-CM

## 2023-05-05 DIAGNOSIS — Z01812 Encounter for preprocedural laboratory examination: Secondary | ICD-10-CM

## 2023-05-05 DIAGNOSIS — N838 Other noninflammatory disorders of ovary, fallopian tube and broad ligament: Secondary | ICD-10-CM | POA: Diagnosis not present

## 2023-05-05 HISTORY — PX: ABDOMINAL HYSTERECTOMY: SHX81

## 2023-05-05 HISTORY — PX: LAPAROSCOPIC VAGINAL HYSTERECTOMY WITH SALPINGECTOMY: SHX6680

## 2023-05-05 LAB — POCT PREGNANCY, URINE: Preg Test, Ur: NEGATIVE

## 2023-05-05 LAB — ABO/RH: ABO/RH(D): O POS

## 2023-05-05 SURGERY — HYSTERECTOMY, VAGINAL, LAPAROSCOPY-ASSISTED, WITH SALPINGECTOMY
Anesthesia: General | Laterality: Bilateral

## 2023-05-05 MED ORDER — FENTANYL CITRATE (PF) 100 MCG/2ML IJ SOLN
INTRAMUSCULAR | Status: DC | PRN
  Administered 2023-05-05 (×2): 50 ug via INTRAVENOUS

## 2023-05-05 MED ORDER — CELECOXIB 200 MG PO CAPS
400.0000 mg | ORAL_CAPSULE | ORAL | Status: AC
  Administered 2023-05-05: 400 mg via ORAL

## 2023-05-05 MED ORDER — ACETAMINOPHEN 500 MG PO TABS
ORAL_TABLET | ORAL | Status: AC
  Filled 2023-05-05: qty 2

## 2023-05-05 MED ORDER — MIDAZOLAM HCL 2 MG/2ML IJ SOLN
INTRAMUSCULAR | Status: AC
  Filled 2023-05-05: qty 2

## 2023-05-05 MED ORDER — FENTANYL CITRATE (PF) 100 MCG/2ML IJ SOLN
INTRAMUSCULAR | Status: AC
  Filled 2023-05-05: qty 2

## 2023-05-05 MED ORDER — SEVOFLURANE IN SOLN
RESPIRATORY_TRACT | Status: AC
  Filled 2023-05-05: qty 250

## 2023-05-05 MED ORDER — FENTANYL CITRATE (PF) 100 MCG/2ML IJ SOLN
25.0000 ug | INTRAMUSCULAR | Status: DC | PRN
  Administered 2023-05-05 (×2): 50 ug via INTRAVENOUS

## 2023-05-05 MED ORDER — SUGAMMADEX SODIUM 200 MG/2ML IV SOLN
INTRAVENOUS | Status: DC | PRN
  Administered 2023-05-05: 434 mg via INTRAVENOUS

## 2023-05-05 MED ORDER — LACTATED RINGERS IV SOLN: INTRAVENOUS | Status: DC

## 2023-05-05 MED ORDER — BUPIVACAINE HCL (PF) 0.5 % IJ SOLN
INTRAMUSCULAR | Status: AC
  Filled 2023-05-05: qty 60

## 2023-05-05 MED ORDER — CHLORHEXIDINE GLUCONATE 0.12 % MT SOLN
OROMUCOSAL | Status: AC
  Filled 2023-05-05: qty 15

## 2023-05-05 MED ORDER — DEXMEDETOMIDINE HCL IN NACL 200 MCG/50ML IV SOLN
INTRAVENOUS | Status: DC | PRN
  Administered 2023-05-05: 16 ug via INTRAVENOUS

## 2023-05-05 MED ORDER — DROPERIDOL 2.5 MG/ML IJ SOLN: 0.6250 mg | Freq: Once | INTRAMUSCULAR | Status: DC | PRN

## 2023-05-05 MED ORDER — DEXAMETHASONE SODIUM PHOSPHATE 10 MG/ML IJ SOLN
INTRAMUSCULAR | Status: AC
  Filled 2023-05-05: qty 1

## 2023-05-05 MED ORDER — OXYCODONE-ACETAMINOPHEN 5-325 MG PO TABS: 1.0000 | ORAL_TABLET | Freq: Four times a day (QID) | ORAL | 0 refills | Status: DC | PRN

## 2023-05-05 MED ORDER — CELECOXIB 200 MG PO CAPS
ORAL_CAPSULE | ORAL | Status: AC
  Filled 2023-05-05: qty 2

## 2023-05-05 MED ORDER — ACETAMINOPHEN 500 MG PO TABS
1000.0000 mg | ORAL_TABLET | ORAL | Status: AC
  Administered 2023-05-05: 1000 mg via ORAL

## 2023-05-05 MED ORDER — DOCUSATE SODIUM 100 MG PO CAPS: 100.0000 mg | ORAL_CAPSULE | Freq: Two times a day (BID) | ORAL | 2 refills | Status: DC | PRN

## 2023-05-05 MED ORDER — ONDANSETRON HCL 4 MG/2ML IJ SOLN
INTRAMUSCULAR | Status: DC | PRN
  Administered 2023-05-05: 4 mg via INTRAVENOUS

## 2023-05-05 MED ORDER — MIDAZOLAM HCL 2 MG/2ML IJ SOLN
INTRAMUSCULAR | Status: DC | PRN
  Administered 2023-05-05: 2 mg via INTRAVENOUS

## 2023-05-05 MED ORDER — ROCURONIUM BROMIDE 100 MG/10ML IV SOLN
INTRAVENOUS | Status: DC | PRN
  Administered 2023-05-05: 50 mg via INTRAVENOUS
  Administered 2023-05-05: 20 mg via INTRAVENOUS

## 2023-05-05 MED ORDER — ORAL CARE MOUTH RINSE: 15.0000 mL | Freq: Once | OROMUCOSAL | Status: AC

## 2023-05-05 MED ORDER — BUPIVACAINE HCL 0.5 % IJ SOLN
INTRAMUSCULAR | Status: DC | PRN
  Administered 2023-05-05: 15 mL

## 2023-05-05 MED ORDER — PROPOFOL 1000 MG/100ML IV EMUL
INTRAVENOUS | Status: AC
  Filled 2023-05-05: qty 100

## 2023-05-05 MED ORDER — PROPOFOL 10 MG/ML IV BOLUS
INTRAVENOUS | Status: DC | PRN
Start: 2023-05-05 — End: 2023-05-05
  Administered 2023-05-05: 100 mg via INTRAVENOUS

## 2023-05-05 MED ORDER — EPHEDRINE SULFATE (PRESSORS) 50 MG/ML IJ SOLN
INTRAMUSCULAR | Status: DC | PRN
  Administered 2023-05-05: 10 mg via INTRAVENOUS

## 2023-05-05 MED ORDER — VASOPRESSIN 20 UNIT/ML IV SOLN
INTRAVENOUS | Status: AC
  Filled 2023-05-05: qty 1

## 2023-05-05 MED ORDER — PROPOFOL 10 MG/ML IV BOLUS
INTRAVENOUS | Status: AC
  Filled 2023-05-05: qty 20

## 2023-05-05 MED ORDER — CEFAZOLIN SODIUM-DEXTROSE 2-4 GM/100ML-% IV SOLN
INTRAVENOUS | Status: AC
  Filled 2023-05-05: qty 100

## 2023-05-05 MED ORDER — 0.9 % SODIUM CHLORIDE (POUR BTL) OPTIME
TOPICAL | Status: DC | PRN
  Administered 2023-05-05: 1000 mL

## 2023-05-05 MED ORDER — GABAPENTIN 300 MG PO CAPS
ORAL_CAPSULE | ORAL | Status: AC
  Filled 2023-05-05: qty 1

## 2023-05-05 MED ORDER — OXYCODONE HCL 5 MG PO TABS
ORAL_TABLET | ORAL | Status: AC
  Filled 2023-05-05: qty 1

## 2023-05-05 MED ORDER — SIMETHICONE 80 MG PO CHEW: 80.0000 mg | CHEWABLE_TABLET | Freq: Four times a day (QID) | ORAL | 2 refills | Status: DC | PRN

## 2023-05-05 MED ORDER — IBUPROFEN 600 MG PO TABS: 600.0000 mg | ORAL_TABLET | Freq: Four times a day (QID) | ORAL | 1 refills | Status: DC | PRN

## 2023-05-05 MED ORDER — GABAPENTIN 300 MG PO CAPS
300.0000 mg | ORAL_CAPSULE | ORAL | Status: AC
  Administered 2023-05-05: 300 mg via ORAL

## 2023-05-05 MED ORDER — DEXAMETHASONE SODIUM PHOSPHATE 10 MG/ML IJ SOLN
INTRAMUSCULAR | Status: DC | PRN
  Administered 2023-05-05: 10 mg via INTRAVENOUS

## 2023-05-05 MED ORDER — CHLORHEXIDINE GLUCONATE 0.12 % MT SOLN
15.0000 mL | Freq: Once | OROMUCOSAL | Status: AC
  Administered 2023-05-05: 15 mL via OROMUCOSAL

## 2023-05-05 MED ORDER — VASOPRESSIN 20 UNIT/ML IV SOLN
INTRAVENOUS | Status: DC | PRN
  Administered 2023-05-05: 10 mL

## 2023-05-05 MED ORDER — LIDOCAINE HCL (CARDIAC) PF 100 MG/5ML IV SOSY
PREFILLED_SYRINGE | INTRAVENOUS | Status: DC | PRN
  Administered 2023-05-05: 100 mg via INTRAVENOUS

## 2023-05-05 MED ORDER — PROPOFOL 500 MG/50ML IV EMUL
INTRAVENOUS | Status: DC | PRN
  Administered 2023-05-05: 75 ug/kg/min via INTRAVENOUS

## 2023-05-05 MED ORDER — OXYCODONE HCL 5 MG PO TABS
5.0000 mg | ORAL_TABLET | Freq: Once | ORAL | Status: AC
  Administered 2023-05-05: 5 mg via ORAL

## 2023-05-05 MED ORDER — POVIDONE-IODINE 10 % EX SWAB
2.0000 | Freq: Once | CUTANEOUS | Status: AC
  Administered 2023-05-05: 2 via TOPICAL

## 2023-05-05 MED ORDER — CEFAZOLIN SODIUM-DEXTROSE 2-4 GM/100ML-% IV SOLN
2.0000 g | Freq: Once | INTRAVENOUS | Status: AC
  Administered 2023-05-05: 2 g via INTRAVENOUS

## 2023-05-05 SURGICAL SUPPLY — 64 items
ADH SKN CLS APL DERMABOND .7 (GAUZE/BANDAGES/DRESSINGS) ×1
APL PRP STRL LF DISP 70% ISPRP (MISCELLANEOUS) ×1
BAG DRN RND TRDRP ANRFLXCHMBR (UROLOGICAL SUPPLIES)
BAG URINE DRAIN 2000ML AR STRL (UROLOGICAL SUPPLIES) ×1 IMPLANT
BLADE SURG 15 STRL LF DISP TIS (BLADE) ×1 IMPLANT
BLADE SURG 15 STRL SS (BLADE) ×1
BLADE SURG SZ10 CARB STEEL (BLADE) ×1 IMPLANT
BLADE SURG SZ11 CARB STEEL (BLADE) ×1 IMPLANT
CATH FOLEY 2WAY 5CC 16FR (CATHETERS) ×1
CATH URTH 16FR FL 2W BLN LF (CATHETERS) ×1 IMPLANT
CHLORAPREP W/TINT 26 (MISCELLANEOUS) ×1 IMPLANT
DERMABOND ADVANCED .7 DNX12 (GAUZE/BANDAGES/DRESSINGS) ×1 IMPLANT
ELECT REM PT RETURN 9FT ADLT (ELECTROSURGICAL) ×1
ELECTRODE REM PT RTRN 9FT ADLT (ELECTROSURGICAL) ×1 IMPLANT
GAUZE 4X4 16PLY ~~LOC~~+RFID DBL (SPONGE) ×3 IMPLANT
GAUZE PACK 2X3YD (PACKING) ×1 IMPLANT
GLOVE BIO SURGEON STRL SZ 6.5 (GLOVE) ×1 IMPLANT
GLOVE BIO SURGEON STRL SZ8 (GLOVE) ×2 IMPLANT
GLOVE INDICATOR 7.0 STRL GRN (GLOVE) ×2 IMPLANT
GLOVE PI ORTHO PRO STRL 7.5 (GLOVE) ×2 IMPLANT
GOWN STRL REUS W/ TWL LRG LVL3 (GOWN DISPOSABLE) ×2 IMPLANT
GOWN STRL REUS W/ TWL XL LVL3 (GOWN DISPOSABLE) ×1 IMPLANT
GOWN STRL REUS W/TWL LRG LVL3 (GOWN DISPOSABLE) ×2
GOWN STRL REUS W/TWL XL LVL3 (GOWN DISPOSABLE) ×1
IRRIGATION STRYKERFLOW (MISCELLANEOUS) ×1 IMPLANT
IRRIGATOR STRYKERFLOW (MISCELLANEOUS)
IV LACTATED RINGERS 1000ML (IV SOLUTION) ×1 IMPLANT
KIT PINK PAD W/HEAD ARE REST (MISCELLANEOUS) ×1
KIT PINK PAD W/HEAD ARM REST (MISCELLANEOUS) ×1 IMPLANT
KIT TURNOVER CYSTO (KITS) ×1 IMPLANT
LABEL OR SOLS (LABEL) ×1 IMPLANT
LIGASURE LAP MARYLAND 5MM 37CM (ELECTROSURGICAL) ×1 IMPLANT
MANIFOLD NEPTUNE II (INSTRUMENTS) ×1 IMPLANT
MANIPULATOR VCARE LG CRV RETR (MISCELLANEOUS) IMPLANT
MANIPULATOR VCARE SML CRV RETR (MISCELLANEOUS) IMPLANT
MANIPULATOR VCARE STD CRV RETR (MISCELLANEOUS) IMPLANT
NDL SPNL 22GX3.5 QUINCKE BK (NEEDLE) ×1 IMPLANT
NEEDLE SPNL 22GX3.5 QUINCKE BK (NEEDLE) ×1 IMPLANT
NS IRRIG 1000ML POUR BTL (IV SOLUTION) IMPLANT
PACK BASIN MINOR ARMC (MISCELLANEOUS) ×1 IMPLANT
PACK GYN LAPAROSCOPIC (MISCELLANEOUS) ×1 IMPLANT
PAD OB MATERNITY 4.3X12.25 (PERSONAL CARE ITEMS) ×1 IMPLANT
SCISSORS METZENBAUM CVD 33 (INSTRUMENTS) ×1 IMPLANT
SCRUB CHG 4% DYNA-HEX 4OZ (MISCELLANEOUS) ×1 IMPLANT
SHEARS HARMONIC ACE PLUS 36CM (ENDOMECHANICALS) IMPLANT
SLEEVE Z-THREAD 5X100MM (TROCAR) ×2 IMPLANT
STRIP CLOSURE SKIN 1/2X4 (GAUZE/BANDAGES/DRESSINGS) ×1 IMPLANT
SUT MNCRL 4-0 (SUTURE) ×1
SUT MNCRL 4-0 27XMFL (SUTURE) ×1
SUT VIC AB 0 CT1 27 (SUTURE)
SUT VIC AB 0 CT1 27XCR 8 STRN (SUTURE) ×1 IMPLANT
SUT VIC AB 0 CT1 36 (SUTURE) ×1 IMPLANT
SUT VIC AB 0 CT2 27 (SUTURE) ×1 IMPLANT
SUT VIC AB 2-0 CT1 (SUTURE) ×1 IMPLANT
SUT VIC AB 4-0 FS2 27 (SUTURE) ×1 IMPLANT
SUTURE MNCRL 4-0 27XMF (SUTURE) ×1 IMPLANT
SYR 10ML LL (SYRINGE) ×1 IMPLANT
SYR 50ML LL SCALE MARK (SYRINGE) IMPLANT
SYR CONTROL 10ML LL (SYRINGE) ×1 IMPLANT
TAPE TRANSPORE STRL 2 31045 (GAUZE/BANDAGES/DRESSINGS) ×1 IMPLANT
TRAP FLUID SMOKE EVACUATOR (MISCELLANEOUS) ×1 IMPLANT
TROCAR Z-THREAD FIOS 5X100MM (TROCAR) ×1 IMPLANT
TUBING EVAC SMOKE HEATED PNEUM (TUBING) ×1 IMPLANT
WATER STERILE IRR 500ML POUR (IV SOLUTION) ×1 IMPLANT

## 2023-05-05 NOTE — Anesthesia Procedure Notes (Signed)
Procedure Name: Intubation Date/Time: 05/05/2023 2:59 PM  Performed by: Elisabeth Pigeon, CRNAPre-anesthesia Checklist: Patient identified, Patient being monitored, Timeout performed, Emergency Drugs available and Suction available Patient Re-evaluated:Patient Re-evaluated prior to induction Oxygen Delivery Method: Circle system utilized Preoxygenation: Pre-oxygenation with 100% oxygen Induction Type: IV induction Ventilation: Mask ventilation without difficulty Laryngoscope Size: Mac and 3 Grade View: Grade I Tube type: Oral Tube size: 7.0 mm Number of attempts: 1 Airway Equipment and Method: Stylet Placement Confirmation: ETT inserted through vocal cords under direct vision, positive ETCO2 and breath sounds checked- equal and bilateral Secured at: 21 cm Tube secured with: Tape Dental Injury: Teeth and Oropharynx as per pre-operative assessment

## 2023-05-05 NOTE — H&P (Signed)
GYNECOLOGY PREOPERATIVE HISTORY AND PHYSICAL   Subjective:  Melanie Whitaker is a 37 y.o. [phone] presents for definitive surgical management of menorrhagia with irregular cycles for the past 2-3 years.  Cycles are q 3-4 months but last for 2 or more weeks when they occur. They are noted to be heavy, changing her menstrual products every 2 hours with passage of clots. Also noting that her cycles are painful. She has been on OCPs however continues to have irregular bleeding. Desires definitive therapy with hysterectomy not only due to her symptoms but also due to strong family history of reproductive cancers (mother diagnosed with uterine cancer approximately 5 years ago, also with a grandmother that had a history of uterine and colon cancer although unsure which was the primary).  Her mother recently underwent genetic testing and was found to have positive genes implicated in Lynch syndrome.  Patient also recently had testing done due to this, however reports that she has returned negative for Lynch syndrome.  No significant preoperative concerns.  Proposed surgery: Laparoscopic assisted vaginal hysterectomy with bilateral salpingectomy    Pertinent Gynecological History: Menses: See HPI Bleeding: dysfunctional uterine bleeding Contraception: none Last mammogram:  : Not age appropriate   Last pap: normal Date: 02/21/2023   Past Medical History:  Diagnosis Date   Anemia    Anxiety    Depression    GERD (gastroesophageal reflux disease)    Manic depression (HCC)    Pneumonia    covid    Past Surgical History:  Procedure Laterality Date   APPENDECTOMY     CHOLECYSTECTOMY     TONSILLECTOMY     TUBAL LIGATION      OB History  Gravida Para Term Preterm AB Living  3 3 3     3   SAB IAB Ectopic Multiple Live Births               # Outcome Date GA Lbr Len/2nd Weight Sex Type Anes PTL Lv  3 Term           2 Term           1 Term             Family History  Problem Relation  Age of Onset   COPD Mother    Uterine cancer Mother 12   Colon cancer Mother 61       MSH2+ Lynch syndrome   Diabetes Father    COPD Father    Crohn's disease Father    Heart attack Father    Colon cancer Maternal Grandmother    Diabetes Maternal Grandfather    Diabetes Paternal Grandmother    Heart failure Paternal Grandmother    Dementia Paternal Grandmother    Heart failure Paternal Grandfather    Lung cancer Paternal Grandfather    Diabetes Daughter    ADD / ADHD Son    Stroke Maternal Aunt    Crohn's disease Maternal Uncle    Diabetes Paternal Uncle    Crohn's disease Paternal Uncle    Social History   Socioeconomic History   Marital status: Single    Spouse name: Not on file   Number of children: 3   Years of education: Not on file   Highest education level: 9th grade  Occupational History   Not on file  Tobacco Use   Smoking status: Never   Smokeless tobacco: Never  Vaping Use   Vaping status: Never Used  Substance and Sexual Activity  Alcohol use: No   Drug use: Never   Sexual activity: Yes    Comment: tubaligation  Other Topics Concern   Not on file  Social History Narrative   Not on file   Social Determinants of Health   Financial Resource Strain: Low Risk  (02/20/2023)   Overall Financial Resource Strain (CARDIA)    Difficulty of Paying Living Expenses: Not hard at all  Food Insecurity: No Food Insecurity (02/20/2023)   Hunger Vital Sign    Worried About Running Out of Food in the Last Year: Never true    Ran Out of Food in the Last Year: Never true  Transportation Needs: No Transportation Needs (02/20/2023)   PRAPARE - Administrator, Civil Service (Medical): No    Lack of Transportation (Non-Medical): No  Physical Activity: Sufficiently Active (02/20/2023)   Exercise Vital Sign    Days of Exercise per Week: 3 days    Minutes of Exercise per Session: 90 min  Stress: Stress Concern Present (02/20/2023)   [employer] of Occupational  Health - Occupational Stress Questionnaire    Feeling of Stress : Very much  Social Connections: Socially Isolated (02/20/2023)   Social Connection and Isolation Panel [NHANES]    Frequency of Communication with Friends and Family: More than three times a week    Frequency of Social Gatherings with Friends and Family: Twice a week    Attends Religious Services: Never    Database administrator or Organizations: No    Attends Engineer, structural: Not on file    Marital Status: Never married  Intimate Partner Violence: Not At Risk (12/09/2018)   Humiliation, Afraid, Rape, and Kick questionnaire    Fear of Current or Ex-Partner: No    Emotionally Abused: No    Physically Abused: No    Sexually Abused: No   No current facility-administered medications on file prior to encounter.   Current Outpatient Medications on File Prior to Encounter  Medication Sig Dispense Refill   busPIRone (BUSPAR) 5 MG tablet Take 1 tablet (5 mg total) by mouth 3 (three) times daily. 90 tablet 11   citalopram (CELEXA) 10 MG tablet Take 1 tablet (10 mg total) by mouth daily. Take in addition to 20 mg for total of 30 mg daily. 90 tablet 1   citalopram (CELEXA) 20 MG tablet Take 1 tablet (20 mg total) by mouth daily. 90 tablet 3   famotidine (PEPCID) 20 MG tablet Take 1 tablet (20 mg total) by mouth 2 (two) times daily. 180 tablet 1   traZODone (DESYREL) 100 MG tablet Take 1 tablet (100 mg total) by mouth at bedtime. 30 tablet 11   sucralfate (CARAFATE) 1 g tablet Take 1 tablet (1 g total) by mouth 4 (four) times daily -  with meals and at bedtime. (Patient not taking: Reported on 05/01/2023) 120 tablet 0    Allergies  Allergen Reactions   Lexapro [Escitalopram Oxalate] Other (See Comments)    SI during use   Sulfa Antibiotics Anaphylaxis   Penicillins    Phenergan [Promethazine Hcl] Other (See Comments)    halluciations      Review of Systems Constitutional: No recent  fever/chills/sweats Respiratory: No recent cough/bronchitis Cardiovascular: No chest pain Gastrointestinal: No recent nausea/vomiting/diarrhea Genitourinary: No UTI symptoms Hematologic/lymphatic:No history of coagulopathy or recent blood thinner use    Objective:   Blood pressure 121/84, pulse 81, temperature 98.6 F (37 C), temperature source Temporal, resp. rate 16, height 5\' 9"  (1.753 m),  weight 108.5 kg, last menstrual period 03/28/2023, SpO2 98%. CONSTITUTIONAL: Well-developed, well-nourished female in no acute distress.  HENT:  Normocephalic, atraumatic, External right and left ear normal. Oropharynx is clear and moist EYES: Conjunctivae and EOM are normal. Pupils are equal, round, and reactive to light. No scleral icterus.  NECK: Normal range of motion, supple, no masses SKIN: Skin is warm and dry. No rash noted. Not diaphoretic. No erythema. No pallor. NEUROLOGIC: Alert and oriented to person, place, and time. Normal reflexes, muscle tone coordination. No cranial nerve deficit noted. PSYCHIATRIC: Normal mood and affect. Normal behavior. Normal judgment and thought content. CARDIOVASCULAR: Normal heart rate noted, regular rhythm RESPIRATORY: Effort and breath sounds normal, no problems with respiration noted ABDOMEN: Soft, nontender, nondistended. PELVIC: Deferred MUSCULOSKELETAL: Normal range of motion. No edema and no tenderness. 2+ distal pulses.    Labs: Lab Results  Component Value Date   WBC 8.6 05/02/2023   HGB 12.3 05/02/2023   HCT 37.3 05/02/2023   MCV 85.6 05/02/2023   PLT 271 05/02/2023    Pathology (03/28/2023):  ENDOMETRIUM, BIOPSY:       Benign endometrium with inactive glands and stromal  pseudodecidualization consistent with exogenous progestin effect.       Negative for hyperplasia, atypia or malignancy.     Imaging Studies:  ULTRASOUND REPORT   Location: [facility] at [facility] Date of Service: 02/28/2023        Indications:Abnormal  Uterine Bleeding - fam hx of uterine cx Findings:  The uterus is anteverted and measures 9.25 x 6.07 x 5.16. Echo texture is homogenous without evidence of focal masses.     The Endometrium measures 12.37 mm. No definitive polyp seen, endo thick near fundus and thins within LUS   Right Ovary measures 2.87 x 1.90 x 1.75 cm. It is normal in appearance. Left Ovary not well seen  Survey of the adnexa demonstrates no adnexal masses. There is no free fluid in the cul de sac.   Impression: 1. Thicken endometrium within fundus   Recommendations: 1.Clinical correlation with the patient's History and Physical Exam. 2. Follow up with provider for recommendations   Waldo Laine, RT     I have reviewed this study and agree with documented findings.      Hildred Laser, MD [facility]   Assessment:    Menorrhagia with irregular cycle Family history of uterine cancer Obesity, BMI 35  Plan:   - Patient desires definitive management with hysterectomy.  I proposed doing a laparoscopic-assisted vaginal hysterectomy (LAVH) and prophylactic bilateral salpingectomy.  No indication for oophorectomy.  Patient agrees with this proposed surgery.  The risks of surgery were discussed in detail with the patient including but not limited to: bleeding which may require transfusion or reoperation; infection which may require antibiotics; injury to bowel, bladder, ureters or other surrounding organs; need for additional procedures including laparotomy or subsequent procedures secondary to abnormal pathology; formation of adhesions; thromboembolic phenomenon; incisional problems and other postoperative/anesthesia complications.  Patient was also advised that she will be discharged home after surgery; and expected recovery time after a hysterectomy is 6-8 weeks.  Patient was told that the likelihood that her condition and symptoms will be treated effectively with this surgical management was very high; the  postoperative expectations were also discussed in detail. The patient also understands the alternative treatment options which were discussed in full. All questions were answered.  She was told that she will be contacted by our surgical scheduler regarding the time and date  of her surgery; routine preoperative instructions will be given to her by the preoperative nursing team.  Routine postoperative instructions will be reviewed with the patient in detail after surgery.  Patient education about the procedure was given to the patient in AVS to review at home. - Preop testing reviewed. - Has been NPO since midnight.       Hildred Laser, MD [facility]

## 2023-05-05 NOTE — Transfer of Care (Signed)
Immediate Anesthesia Transfer of Care Note  Patient: Melanie Whitaker  Procedure(s) Performed: Procedure(s): LAPAROSCOPIC ASSISTED VAGINAL HYSTERECTOMY WITH BILATERAL SALPINGECTOMY (Bilateral)  Patient Location: PACU  Anesthesia Type:General  Level of Consciousness: sedated  Airway & Oxygen Therapy: Patient Spontanous Breathing and Patient connected to face mask oxygen  Post-op Assessment: Report given to RN and Post -op Vital signs reviewed and stable  Post vital signs: Reviewed and stable  Last Vitals:  Vitals:   05/05/23 1128 05/05/23 1704  BP: 121/84 (!) 141/83  Pulse: 81 78  Resp: 16 (!) 21  Temp: 37 C 36.6 C  SpO2: 98% 100%    Complications: No apparent anesthesia complications

## 2023-05-05 NOTE — Discharge Instructions (Signed)

## 2023-05-05 NOTE — Progress Notes (Signed)
Patient ambulated to bathroom with assistance, pt strained when voiding and this RN informed patient not to strain when voiding. Sit on the toilet and allow urine to flow. Patient verbalized understanding, this RN also educated patient family to not strain when voiding. Patient voided 30 cc and was bladder scanned with result of 0 mL. Dr. Valentino Saxon notified, this RN awaiting response. Patient denies any other complaints at this time

## 2023-05-05 NOTE — Progress Notes (Signed)
This RN spoke with Dr. Valentino Saxon, patient ok to be discharged home and instructed patient to keep hydrated and if unable to void in 6-8 hours to return to emergency department. Patient and family verbalized understanding.

## 2023-05-05 NOTE — Anesthesia Preprocedure Evaluation (Signed)
Anesthesia Evaluation  Patient identified by MRN, date of birth, ID band Patient awake    Reviewed: Allergy & Precautions, H&P , NPO status , Patient's Chart, lab work & pertinent test results, reviewed documented beta blocker date and time   History of Anesthesia Complications Negative for: history of anesthetic complications  Airway Mallampati: II  TM Distance: >3 FB Neck ROM: full    Dental  (+) Dental Advidsory Given, Teeth Intact, Missing   Pulmonary neg pulmonary ROS, Continuous Positive Airway Pressure Ventilation    Pulmonary exam normal breath sounds clear to auscultation       Cardiovascular Exercise Tolerance: Good negative cardio ROS Normal cardiovascular exam Rhythm:regular Rate:Normal     Neuro/Psych  PSYCHIATRIC DISORDERS Anxiety Depression Bipolar Disorder   negative neurological ROS     GI/Hepatic Neg liver ROS,GERD  ,,  Endo/Other  negative endocrine ROS    Renal/GU negative Renal ROS  negative genitourinary   Musculoskeletal   Abdominal   Peds  Hematology negative hematology ROS (+)   Anesthesia Other Findings Past Medical History: No date: Anemia No date: Anxiety No date: Depression No date: GERD (gastroesophageal reflux disease) No date: Manic depression (HCC) No date: Pneumonia     Comment:  covid   Reproductive/Obstetrics negative OB ROS                             Anesthesia Physical Anesthesia Plan  ASA: 2  Anesthesia Plan: General   Post-op Pain Management:    Induction: Intravenous  PONV Risk Score and Plan: 3 and Ondansetron, Dexamethasone, Midazolam and Treatment may vary due to age or medical condition  Airway Management Planned: Oral ETT  Additional Equipment:   Intra-op Plan:   Post-operative Plan: Extubation in OR  Informed Consent: I have reviewed the patients History and Physical, chart, labs and discussed the procedure including  the risks, benefits and alternatives for the proposed anesthesia with the patient or authorized representative who has indicated his/her understanding and acceptance.     Dental Advisory Given  Plan Discussed with: Anesthesiologist, CRNA and Surgeon  Anesthesia Plan Comments:         Anesthesia Quick Evaluation

## 2023-05-05 NOTE — Op Note (Signed)
Procedure(s): LAPAROSCOPIC ASSISTED VAGINAL HYSTERECTOMY WITH BILATERAL SALPINGECTOMY Procedure Note  Melanie Whitaker female 37 y.o. 05/05/2023  Indications: The patient is a 37 y.o. [id number] female with menorrhagia with irregular cycle, history of anemia, family history of uterine cancer (patient with negative hereditary genetic screening)  Pre-operative Diagnosis: Menorrhagia with irregular cycle, history of anemia, family history of uterine cancer  Post-operative Diagnosis: Same  Surgeon: Hildred Laser, MD   Assistants:  Brennan Bailey, MD  Anesthesia: General endotracheal anesthesia   Findings: The uterus was sounded to 9 cm Uterus, fallopian tubes, and ovaries appeared normal.   Procedure Details: The patient was seen in the Holding Room. The risks, benefits, complications, treatment options, and expected outcomes were discussed with the patient.  The patient concurred with the proposed plan, giving informed consent.  The site of surgery properly noted/marked. The patient was taken to the Operating Room, identified as Melanie Whitaker and the procedure verified as Procedure(s) (LRB): LAPAROSCOPIC ASSISTED VAGINAL HYSTERECTOMY WITH BILATERAL SALPINGECTOMY (Bilateral).   She was then placed under general anesthesia without difficulty. She was placed in the dorsal lithotomy position, and was prepped and draped in a sterile manner.  A Time Out was held and the above information confirmed.  A straight catheterization was performed. A sterile speculum was inserted into the vagina and the cervix was grasped at the anterior lip using a single-toothed tenaculum.  The uterus was sounded to 9 cm, and a Hulka clamp was placed for uterine manipulation.  The speculum and tenaculum were then removed. Attention was turned to the abdomen where an umbilical incision was made with the scalpel.  The Optiview 5-mm trocar and sleeve were then advanced without difficulty with the laparoscope under direct  visualization into the abdomen.  The abdomen was then insufflated with carbon dioxide gas and adequate pneumoperitoneum was obtained. Bilateral 5-mm lower quadrant ports were then placed under direct visualization.  A survey of the patient's pelvis and abdomen revealed the findings as above.  Attention was turned to the mesosalpinx of the right fallopian tube, which was clamped using the Ligasure scalpel and ligated. The utero-ovarian ligament and the round ligament were also ligated with the Ligasure scalpel working towards the broad ligament.  The round and broad ligaments were then clamped and transected with the Ligasure scalpel.  The ureter were noted to be safely away from the area of dissection.  The uterine artery was then skeletonized and a bladder flap was created.  The bladder was then bluntly dissected off the lower uterine segment.  At this point, attention was turned to the right uterine vessels, which were clamped and ligated using the Ligasure scalpel.  Attention was then turned to the patient's left side, which was treated in a similar manner by taking the infundibulopelvic ligament, the round ligament and the broad ligaments and the bladder flap creation was completed. The left uterine vessels were also clamped and transected in a similar fashion.  A final survey of the operative site was performed, and hemostasis was noted throughout. No intraoperative injury to other surrounding organs was noted.  The abdomen was desufflated and all instruments were then removed from the patient's abdomen.   All skin incisions were injected with a total of 10 ml of 0.5% Sensorcaine and closed with 4-0 Monocryl.  The incisions were covered with Dermabond. The decision was made to proceed with completing the hysterectomy via the vaginal route .  Attention was then turned to her pelvis.  A weighted speculum was then  placed in the vagina, and the anterior and posterior lips of the cervix were grasped bilaterally  with tenaculums.  The cervix was then injected circumferentially with 20 ml of Petressin solution to maintain hemostasis.  The cervix was then circumferentially incised, and the posterior cul-de-sac was entered sharply without difficulty.  A long weighted speculum was inserted into the posterior cul-de-sac. The Heaney clamp was then used to clamp the uterosacral ligaments on either side.  They were then cut and sutured ligated with 0 Vicryl, and were held with a tag for later identification. Of note, all sutures used in this case were 0 Vicryl unless otherwise noted.   The cardinal ligaments were then clamped, cut and ligated bilaterally.  At this point, full entry into the anterior cul-de-sac was made, no injury to the bladder was noted.  The uterus was freed from all ligaments and was then delivered and sent to pathology.  A 0-Vicryl suture was then used to close the angles of the vagina bilaterally, with care given to incorporate the uterosacral pedicles bilaterally.  Next, a 0-Vicryl suture was used to close the peritoneum of the vaginal cuff in a pursestring fashion.  The vaginal cuff was then closed with a series of figure-of-eight sutures using 0 Vicryl.  All instruments were then removed from the pelvis  The patient tolerated the procedures well.  All instruments, needles, and sponge counts were correct  times three. The patient was taken to the recovery room awake, extubated and in stable condition.    An experienced assistant was required given the standard of surgical care given the complexity of the case.  This assistant was needed for exposure, dissection, suctioning, retraction, instrument exchange, and for overall help during the procedure.   Estimated Blood Loss:  120 ml      Drains: foley catheterization prior to procedure to gravity drainage with 100 ml of clear urine at end of procedure         Total IV Fluids:  900 ml  Specimens:  Uterus with cervix, bilateral fallopian tubes          Implants: None         Complications:  None; patient tolerated the procedure well.         Disposition: PACU - hemodynamically stable.         Condition: stable   Hildred Laser, MD [facility]

## 2023-05-05 NOTE — Anesthesia Postprocedure Evaluation (Signed)
Anesthesia Post Note  Patient: Melanie Whitaker  Procedure(s) Performed: LAPAROSCOPIC ASSISTED VAGINAL HYSTERECTOMY WITH BILATERAL SALPINGECTOMY (Bilateral)  Patient location during evaluation: PACU Anesthesia Type: General Level of consciousness: awake and alert Pain management: pain level controlled Vital Signs Assessment: post-procedure vital signs reviewed and stable Respiratory status: spontaneous breathing, nonlabored ventilation, respiratory function stable and patient connected to nasal cannula oxygen Cardiovascular status: blood pressure returned to baseline and stable Postop Assessment: no apparent nausea or vomiting Anesthetic complications: no   No notable events documented.   Last Vitals:  Vitals:   05/05/23 1800 05/05/23 1854  BP: 119/84 111/73  Pulse: 78 70  Resp: 20 16  Temp: 36.6 C 36.6 C  SpO2: 97% 97%    Last Pain:  Vitals:   05/05/23 1830  TempSrc:   PainSc: 5                  Corinda Gubler

## 2023-05-06 ENCOUNTER — Encounter: Payer: Self-pay | Admitting: Obstetrics and Gynecology

## 2023-05-07 ENCOUNTER — Encounter: Payer: Self-pay | Admitting: Obstetrics and Gynecology

## 2023-05-08 MED ORDER — OXYCODONE-ACETAMINOPHEN 5-325 MG PO TABS: 1.0000 | ORAL_TABLET | Freq: Four times a day (QID) | ORAL | 0 refills | Status: DC | PRN

## 2023-05-13 NOTE — Progress Notes (Unsigned)
    OBSTETRICS/GYNECOLOGY POST-OPERATIVE CLINIC VISIT  Subjective:     Melanie Whitaker is a 37 y.o. female who presents to the clinic 1 weeks status post LAPAROSCOPIC ASSISTED VAGINAL HYSTERECTOMY WITH BILATERAL SALPINGECTOMY  for menorrhagia with irregular cycles, family history of uterine cancer . Eating a regular diet with out difficulty. Bowel movements are abnormal with a lot of vaginal/rectal pain and pressure .  Is using Colace as prescribed.  No medication seems to help.  Notes that her muscles seem to tighten up whenever she needs to have a bowel movement or empty her bladder.  Has tried ibuprofen which makes her sick.  Unable to take Tylenol due to history of elevated liver enzymes.  Also is noting some back pain/spasms as well.  Did report an episode of light bleeding vaginally after doing a load of laundry.  That has since resolved.  Lastly is noting hot flashes.  The following portions of the patient's history were reviewed and updated as appropriate: allergies, current medications, past family history, past medical history, past social history, past surgical history, and problem list.  Review of Systems Pertinent items noted in HPI and remainder of comprehensive ROS otherwise negative.   Objective:   BP 116/84   Pulse 86   Ht 5\' 9"  (1.753 m)   Wt 236 lb 14.4 oz (107.5 kg)   LMP 03/28/2023 (Exact Date)   BMI 34.98 kg/m  Body mass index is 34.98 kg/m.  General:  alert and no distress  Abdomen: soft, bowel sounds active, non-tender  Incision:   healing well, no drainage, no erythema, no hernia, no seroma, no swelling, no dehiscence, incision well approximated  Pelvis:  External genitalia normal.  Vagina with small amount of thin yellow discharge, no odor.  Suture material present at vaginal apex.  Vaginal tissue overall appears to be healing well.    Pathology:  Uterus, cervix and bilateral fallopian tubes - CHRONIC ENDOCERVICITIS WITH FOCAL SQUAMOUS METAPLASIA. - SECRETORY  ENDOMETRIUM. - UNREMARKABLE MYOMETRIUM. - BILATERAL FALLOPIAN TUBES WITH FIMBRIATED END AND PARATUBAL CYSTS. - NEGATIVE FOR MALIGNANCY.  Assessment:   Patient s/p LAPAROSCOPIC ASSISTED VAGINAL HYSTERECTOMY WITH BILATERAL SALPINGECTOMY   Muscle spasms   Plan:   1. Continue any current medications as instructed by provider.  Will also prescribe Flexeril for muscle spasms.  Advised that hot flashes will likely resolve with time.  Can use herbal remedies for management at this time such as peppermint oil, evening primrose oil, or increasing soy intake. 2. Wound care discussed. 3. Operative findings again reviewed. Pathology report discussed.  Results benign. 4. Activity restrictions: no bending, stooping, or squatting, no lifting more than 10-15 pounds, and pelvic rest. 5. Anticipated return to work: 4 weeks.  Patient also desires work notice and states that she has been out for recovery from surgery.  Letter provided. 6. Follow up: 4 weeks for final postoperative appointment.    Hildred Laser, MD  [facility]

## 2023-05-14 ENCOUNTER — Ambulatory Visit (INDEPENDENT_AMBULATORY_CARE_PROVIDER_SITE_OTHER): Payer: Medicaid Other | Admitting: Obstetrics and Gynecology

## 2023-05-14 ENCOUNTER — Encounter: Payer: Self-pay | Admitting: Obstetrics and Gynecology

## 2023-05-14 VITALS — BP 116/84 | HR 86 | Ht 69.0 in | Wt 236.9 lb

## 2023-05-14 DIAGNOSIS — M62838 Other muscle spasm: Secondary | ICD-10-CM

## 2023-05-14 DIAGNOSIS — Z8049 Family history of malignant neoplasm of other genital organs: Secondary | ICD-10-CM

## 2023-05-14 DIAGNOSIS — Z4889 Encounter for other specified surgical aftercare: Secondary | ICD-10-CM

## 2023-05-14 DIAGNOSIS — R232 Flushing: Secondary | ICD-10-CM

## 2023-05-14 DIAGNOSIS — Z90710 Acquired absence of both cervix and uterus: Secondary | ICD-10-CM

## 2023-05-14 MED ORDER — CYCLOBENZAPRINE HCL 10 MG PO TABS: 10.0000 mg | ORAL_TABLET | Freq: Three times a day (TID) | ORAL | 1 refills | Status: DC | PRN

## 2023-05-20 ENCOUNTER — Telehealth: Payer: Self-pay | Admitting: Obstetrics and Gynecology

## 2023-05-20 ENCOUNTER — Other Ambulatory Visit: Payer: Self-pay

## 2023-05-20 ENCOUNTER — Emergency Department
Admission: EM | Admit: 2023-05-20 | Discharge: 2023-05-20 | Disposition: A | Discharge status: Home / Self Care | Payer: Medicaid Other | Attending: Emergency Medicine | Admitting: Emergency Medicine

## 2023-05-20 ENCOUNTER — Emergency Department: Payer: Medicaid Other

## 2023-05-20 DIAGNOSIS — R1030 Lower abdominal pain, unspecified: Secondary | ICD-10-CM

## 2023-05-20 DIAGNOSIS — N939 Abnormal uterine and vaginal bleeding, unspecified: Secondary | ICD-10-CM | POA: Insufficient documentation

## 2023-05-20 DIAGNOSIS — R109 Unspecified abdominal pain: Secondary | ICD-10-CM | POA: Diagnosis not present

## 2023-05-20 LAB — COMPREHENSIVE METABOLIC PANEL
ALT: 28 U/L (ref 0–44)
AST: 25 U/L (ref 15–41)
Albumin: 4 g/dL (ref 3.5–5.0)
Alkaline Phosphatase: 99 U/L (ref 38–126)
Anion gap: 10 (ref 5–15)
BUN: 11 mg/dL (ref 6–20)
CO2: 24 mmol/L (ref 22–32)
Calcium: 9 mg/dL (ref 8.9–10.3)
Chloride: 102 mmol/L (ref 98–111)
Creatinine, Ser: 0.8 mg/dL (ref 0.44–1.00)
GFR, Estimated: >60 mL/min (ref >60)
Glucose, Bld: 163 mg/dL — ABNORMAL HIGH (ref 70–99)
Potassium: 4.1 mmol/L (ref 3.5–5.1)
Sodium: 136 mmol/L (ref 135–145)
Total Bilirubin: 0.4 mg/dL (ref 0.3–1.2)
Total Protein: 8.1 g/dL (ref 6.5–8.1)

## 2023-05-20 LAB — CBC WITH DIFFERENTIAL/PLATELET
Abs Immature Granulocytes: 0.09 10*3/uL — ABNORMAL HIGH (ref 0.00–0.07)
Basophils Absolute: 0 10*3/uL (ref 0.0–0.1)
Basophils Relative: 0 %
Eosinophils Absolute: 0.4 10*3/uL (ref 0.0–0.5)
Eosinophils Relative: 4 %
HCT: 38.6 % (ref 36.0–46.0)
Hemoglobin: 12.5 g/dL (ref 12.0–15.0)
Immature Granulocytes: 1 %
Lymphocytes Relative: 26 %
Lymphs Abs: 3.2 10*3/uL (ref 0.7–4.0)
MCH: 28.5 pg (ref 26.0–34.0)
MCHC: 32.4 g/dL (ref 30.0–36.0)
MCV: 87.9 fL (ref 80.0–100.0)
Monocytes Absolute: 0.6 10*3/uL (ref 0.1–1.0)
Monocytes Relative: 5 %
Neutro Abs: 7.9 10*3/uL — ABNORMAL HIGH (ref 1.7–7.7)
Neutrophils Relative %: 64 %
Platelets: 374 10*3/uL (ref 150–400)
RBC: 4.39 MIL/uL (ref 3.87–5.11)
RDW: 13.8 % (ref 11.5–15.5)
WBC: 12.2 10*3/uL — ABNORMAL HIGH (ref 4.0–10.5)
nRBC: 0 % (ref 0.0–0.2)

## 2023-05-20 LAB — URINALYSIS, ROUTINE W REFLEX MICROSCOPIC
Bacteria, UA: NONE SEEN
Bilirubin Urine: NEGATIVE
Glucose, UA: NEGATIVE mg/dL
Ketones, ur: NEGATIVE mg/dL
Nitrite: NEGATIVE
Protein, ur: 100 mg/dL — AB
RBC / HPF: >50 RBC/hpf (ref 0–5)
Specific Gravity, Urine: 1.017 (ref 1.005–1.030)
WBC, UA: >50 WBC/hpf (ref 0–5)
pH: 6 (ref 5.0–8.0)

## 2023-05-20 LAB — LIPASE, BLOOD: Lipase: 34 U/L (ref 11–51)

## 2023-05-20 MED ORDER — MORPHINE SULFATE (PF) 4 MG/ML IV SOLN
4.0000 mg | Freq: Once | INTRAVENOUS | Status: AC
  Administered 2023-05-20: 4 mg via INTRAVENOUS
  Filled 2023-05-20: qty 1

## 2023-05-20 MED ORDER — IOHEXOL 300 MG/ML  SOLN
100.0000 mL | Freq: Once | INTRAMUSCULAR | Status: AC | PRN
  Administered 2023-05-20: 100 mL via INTRAVENOUS

## 2023-05-20 MED ORDER — OXYCODONE-ACETAMINOPHEN 5-325 MG PO TABS: 1.0000 | ORAL_TABLET | Freq: Four times a day (QID) | ORAL | 0 refills | Status: AC | PRN

## 2023-05-20 MED ORDER — ONDANSETRON HCL 4 MG/2ML IJ SOLN
INTRAMUSCULAR | Status: AC
  Administered 2023-05-20: 4 mg
  Filled 2023-05-20: qty 2

## 2023-05-20 NOTE — ED Triage Notes (Signed)
Patient ambulatory to triage with steady gait, without difficulty or distress noted; pt reports hyserectomy 2wks ago; having persistent abd pain/pressure since unrelieved by muscle relaxers rx by Dr Valentino Saxon; tonight began having heavy vag bleeding with clots

## 2023-05-20 NOTE — ED Provider Notes (Signed)
[facility] Provider Note    Event Date/Time   First MD Initiated Contact with Patient 05/20/23 0401     (approximate)   History   Vaginal Bleeding   HPI  Melanie Whitaker is a 37 y.o. female presenting to the emergency department for evaluation of vaginal bleeding.  On 7/15, patient underwent laparoscopic hysterectomy with Dr. Valentino Saxon.  She had a small amount of pink vaginal discharge following the procedure, but no significant vaginal bleeding.  She does report that she has been having ongoing lower abdominal fullness and cramping.  Yesterday, she was sitting on a couch when she felt sensation of warmness in her groin area.  She noticed her pants were wet and when she touched them there was blood on her hands that had soaked through to the couch.  When she went to the bathroom she did also notice clots.  In the setting of this, she presented to the ER.  Reports that while she was in the ER, did have another episode of vaginal bleeding.      Physical Exam   Triage Vital Signs: ED Triage Vitals  Encounter Vitals Group     BP 05/20/23 0122 118/63     Systolic BP Percentile --      Diastolic BP Percentile --      Pulse Rate 05/20/23 0122 92     Resp 05/20/23 0122 18     Temp 05/20/23 0122 98.1 F (36.7 C)     Temp Source 05/20/23 0122 Oral     SpO2 05/20/23 0122 98 %     Weight 05/20/23 0123 225 lb (102.1 kg)     Height 05/20/23 0123 5\' 10"  (1.778 m)     Head Circumference --      Peak Flow --      Pain Score 05/20/23 0123 10     Pain Loc --      Pain Education --      Exclude from Growth Chart --     Most recent vital signs: Vitals:   05/20/23 0630 05/20/23 0706  BP: 112/78 105/64  Pulse: 64 63  Resp:  16  Temp:  98 F (36.7 C)  SpO2: 99% 99%     General: Awake, interactive  CV:  Regular rate, good peripheral perfusion.  Resp:  Lungs clear, unlabored respirations.  Abd:  Soft, nondistended, tenderness to palpation over the lower abdomen  without rebound or guarding GU:  On external exam, no visible vaginal bleeding noted.  No external lacerations.  Internal exam deferred in the setting of recent vaginal cough. Neuro:  Symmetric facial movement, fluid speech   ED Results / Procedures / Treatments   Labs (all labs ordered are listed, but only abnormal results are displayed) Labs Reviewed  CBC WITH DIFFERENTIAL/PLATELET - Abnormal; Notable for the following components:      Result Value   WBC 12.2 (*)    Neutro Abs 7.9 (*)    Abs Immature Granulocytes 0.09 (*)    All other components within normal limits  COMPREHENSIVE METABOLIC PANEL - Abnormal; Notable for the following components:   Glucose, Bld 163 (*)    All other components within normal limits  URINALYSIS, ROUTINE W REFLEX MICROSCOPIC - Abnormal; Notable for the following components:   Color, Urine AMBER (*)    APPearance CLOUDY (*)    Hgb urine dipstick LARGE (*)    Protein, ur 100 (*)    Leukocytes,Ua LARGE (*)    All  other components within normal limits  LIPASE, BLOOD     EKG EKG independently reviewed interpreted by myself (ER attending) demonstrates:    RADIOLOGY Imaging independently reviewed and interpreted by myself demonstrates:  CT abdomen pelvis demonstrates edema surrounding patient's vaginal cuff without obvious abscess.  PROCEDURES:  Critical Care performed: No  Procedures   MEDICATIONS ORDERED IN ED: Medications  morphine (PF) 4 MG/ML injection 4 mg (4 mg Intravenous Given 05/20/23 0443)  ondansetron (ZOFRAN) 4 MG/2ML injection (4 mg  Given 05/20/23 0444)  iohexol (OMNIPAQUE) 300 MG/ML solution 100 mL (100 mLs Intravenous Contrast Given 05/20/23 0533)     IMPRESSION / MDM / ASSESSMENT AND PLAN / ED COURSE  I reviewed the triage vital signs and the nursing notes.  Differential diagnosis includes, but is not limited to, vaginal cuff issue, other postsurgical complication, bowel obstruction, colitis  Patient's presentation is  most consistent with acute presentation with potential threat to life or bodily function.  37 year old female presenting with vaginal bleeding several weeks after hysterectomy.  No active bleeding on exam here.  Labs without severe derangement.  Urinalysis limited due to presence of significant blood.  CT without evidence of bowel obstruction.  Possible ileus reported, but patient reporting she is having regular bowel movements, clinically not reflective of ileus.  CT did note some inflammation around the patient's vaginal cuff. GYN was consulted.  Case was discussed with nurse midwife Haroldine Laws who reported that she would contact Dr. Valentino Saxon to discuss the case and provide further recommendations.    I was updated by Haroldine Laws that she had spoken with Dr. Valentino Saxon and she did feel that the patient's vaginal bleeding was not unexpected following her hysterectomy.  She did not feel that the patient needed any further workup or evaluation in the ER.  I discussed this with the patient and she is comfortable with plan for discharge.  She does report that she is out of the Percocet that she was prescribed postoperatively.  In the setting of her acute pain, will DC with short course of additional pain medicine.  Strict return precautions provided.  Patient discharged in stable condition with plans for outpatient follow-up.     FINAL CLINICAL IMPRESSION(S) / ED DIAGNOSES   Final diagnoses:  Vaginal bleeding  Lower abdominal pain     Rx / DC Orders   ED Discharge Orders          Ordered    oxyCODONE-acetaminophen (PERCOCET) 5-325 MG tablet  Every 6 hours PRN        05/20/23 0756             Note:  This document was prepared using Dragon voice recognition software and may include unintentional dictation errors.   Trinna Post, MD 05/20/23 [phone]

## 2023-05-20 NOTE — Discharge Instructions (Signed)
You were seen in the ER today for your vaginal bleeding.  We reviewed your case and testing with Dr. Valentino Saxon.  We do feel that he can be safely discharged home.  I have sent a short course of additional pain medicine to your pharmacy that you can take as needed.  Do not drive or operate machinery when taking this.  Return to the ER for new or worsening symptoms.

## 2023-05-20 NOTE — Telephone Encounter (Signed)
Patient called this morning, she was seen in the ER last night, she is have bleeding following a hysterectomy on 05/05/2023.  She has a post op scheduled for 06/11/2023; however the patient would like to be seen earlier than scheduled.  Please Advise

## 2023-05-20 NOTE — Telephone Encounter (Signed)
8/6, 11:15 is fine

## 2023-05-20 NOTE — Telephone Encounter (Signed)
As I just saw her last week and she was seen today in the ER, I can see her again sometime next week.

## 2023-05-27 NOTE — Progress Notes (Unsigned)
    OBSTETRICS/GYNECOLOGY POST-OPERATIVE CLINIC VISIT  Subjective:     Melanie Whitaker is a 37 y.o. female who presents to the clinic 2 weeks status post LAPAROSCOPIC ASSISTED VAGINAL HYSTERECTOMY WITH BILATERAL SALPINGECTOMY for menorrhagia with irregular cycles, family history of uterine cancer . Eating a regular diet {with-without:5700} difficulty. Bowel movements are {normal/abnormal***:19619}. {pain control:13522::"The patient is not having any pain."} She was evaluated in ED for vaginal bleeding and lower abdominal pain on 05/20/2023. No active bleeding on exam at ED. Labs without severe derangement. Urinalysis limited due to presence of significant blood. CT without evidence of bowel obstruction. Possible ileus reported, but patient reporting she is having regular bowel movements, clinically not reflective of ileus. CT did note some inflammation around the patient's vaginal cuff. GYN was consulted   {Common ambulatory SmartLinks:19316}  Review of Systems {ros; complete:30496}   Objective:   LMP 03/28/2023 (Exact Date)  There is no height or weight on file to calculate BMI.  General:  alert and no distress  Abdomen: soft, bowel sounds active, non-tender  Incision:   {incision:13716::"no dehiscence","incision well approximated","healing well","no drainage","no erythema","no hernia","no seroma","no swelling"}    Pathology:    Assessment:   Patient s/p *** (surgery)  {doing well:13525::"Doing well postoperatively."}   Plan:   1. Continue any current medications as instructed by provider. 2. Wound care discussed. 3. Operative findings again reviewed. Pathology report discussed. 4. Activity restrictions: {restrictions:13723} 5. Anticipated return to work: {work return:14002}. 6. Follow up: {1-61:09604} {time; units:18646} for ***    Hildred Laser, MD [facility]

## 2023-05-28 ENCOUNTER — Ambulatory Visit (INDEPENDENT_AMBULATORY_CARE_PROVIDER_SITE_OTHER): Payer: Medicaid Other | Admitting: Obstetrics and Gynecology

## 2023-05-28 ENCOUNTER — Encounter: Payer: Self-pay | Admitting: Obstetrics and Gynecology

## 2023-05-28 VITALS — BP 130/85 | HR 96 | Ht 70.0 in | Wt 240.0 lb

## 2023-05-28 DIAGNOSIS — Z4889 Encounter for other specified surgical aftercare: Secondary | ICD-10-CM

## 2023-05-28 DIAGNOSIS — Z90710 Acquired absence of both cervix and uterus: Secondary | ICD-10-CM

## 2023-05-28 DIAGNOSIS — R102 Pelvic and perineal pain: Secondary | ICD-10-CM

## 2023-05-28 DIAGNOSIS — R82998 Other abnormal findings in urine: Secondary | ICD-10-CM | POA: Diagnosis not present

## 2023-05-28 DIAGNOSIS — N99820 Postprocedural hemorrhage and hematoma of a genitourinary system organ or structure following a genitourinary system procedure: Secondary | ICD-10-CM

## 2023-05-28 LAB — POCT URINALYSIS DIPSTICK
Bilirubin, UA: NEGATIVE
Glucose, UA: NEGATIVE
Ketones, UA: NEGATIVE
Nitrite, UA: NEGATIVE
Protein, UA: POSITIVE — AB
Spec Grav, UA: 1.015 (ref 1.010–1.025)
Urobilinogen, UA: 0.2 E.U./dL
pH, UA: 7 (ref 5.0–8.0)

## 2023-05-28 MED ORDER — METRONIDAZOLE 500 MG PO TABS: 500.0000 mg | ORAL_TABLET | Freq: Two times a day (BID) | ORAL | 0 refills | Status: AC

## 2023-06-10 NOTE — Progress Notes (Unsigned)
    OBSTETRICS/GYNECOLOGY POST-OPERATIVE CLINIC VISIT  Subjective:     Melanie Whitaker is a 37 y.o. female who presents to the clinic 6 weeks status post LAPAROSCOPIC ASSISTED VAGINAL HYSTERECTOMY WITH BILATERAL SALPINGECTOMY  for Menorrhagia with irregular cycles, family history of uterine cancer . Eating a regular diet with difficulty (has somewhat decreased appetite).  Bowel movements are normal. The patient is not having any pain.  Notes she still has some spotting, sometimes light brown or pink but nothing heavy. Recently treated for BV infection 2 weeks ago, notes discharge has resolved. Also still noting some mild fatigue.   The following portions of the patient's history were reviewed and updated as appropriate: allergies, current medications, past family history, past medical history, past social history, past surgical history, and problem list.  Review of Systems Pertinent items are noted in HPI.   Objective:   BP (!) 117/59   Pulse 79   Resp 16   Ht 5\' 10"  (1.778 m)   Wt 238 lb 3.2 oz (108 kg)   BMI 34.18 kg/m  There is no height or weight on file to calculate BMI.  General:  alert and no distress  Abdomen: soft, bowel sounds active, non-tender  Incision:   healing well, no drainage, no erythema, no hernia, no seroma, no swelling, no dehiscence, incision well approximated  Pelvis:  External genitalia normal. Vagina without discharge or odor.  Uterus and cervix surgically absent, vaginal sutures still noted along apex. Bimanual exam no performed.     Pathology:   Reviewed at prior visit, benign.   Assessment:   Patient s/p LAPAROSCOPIC ASSISTED VAGINAL HYSTERECTOMY WITH BILATERAL SALPINGECTOMY     Plan:   1. Continue any current medications as instructed by provider. 2. Post-operative care discussed.  3. Operative findings again reviewed. Pathology report discussed. 4. Activity restrictions: no bending, stooping, or squatting, no lifting more than 15 pounds, and  pelvic rest for additional 2 weeks.  5. Anticipated return to work: with restrictions: no heavy lifting x 2 weeks, and work letter provided. 6. Follow up:  Return to clinic for any scheduled appointments or for any gynecologic concerns as needed.          Hildred Laser, MD [facility]

## 2023-06-11 ENCOUNTER — Ambulatory Visit (INDEPENDENT_AMBULATORY_CARE_PROVIDER_SITE_OTHER): Payer: Medicaid Other | Admitting: Obstetrics and Gynecology

## 2023-06-11 ENCOUNTER — Encounter: Payer: Self-pay | Admitting: Obstetrics and Gynecology

## 2023-06-11 VITALS — BP 117/59 | HR 79 | Resp 16 | Ht 70.0 in | Wt 238.2 lb

## 2023-06-11 DIAGNOSIS — Z8049 Family history of malignant neoplasm of other genital organs: Secondary | ICD-10-CM

## 2023-06-11 DIAGNOSIS — Z90710 Acquired absence of both cervix and uterus: Secondary | ICD-10-CM

## 2023-06-11 DIAGNOSIS — Z4889 Encounter for other specified surgical aftercare: Secondary | ICD-10-CM

## 2023-06-18 ENCOUNTER — Other Ambulatory Visit: Payer: Self-pay | Admitting: Family Medicine

## 2023-06-25 ENCOUNTER — Other Ambulatory Visit: Payer: Self-pay | Admitting: Family Medicine

## 2023-06-28 ENCOUNTER — Other Ambulatory Visit: Payer: Self-pay | Admitting: Obstetrics and Gynecology

## 2023-07-09 ENCOUNTER — Encounter: Payer: Self-pay | Admitting: Licensed Practical Nurse

## 2023-07-15 DIAGNOSIS — N390 Urinary tract infection, site not specified: Secondary | ICD-10-CM | POA: Diagnosis not present

## 2023-07-15 DIAGNOSIS — R300 Dysuria: Secondary | ICD-10-CM | POA: Diagnosis not present

## 2023-07-21 ENCOUNTER — Encounter: Payer: Self-pay | Admitting: Family Medicine

## 2023-08-29 ENCOUNTER — Encounter: Payer: Medicaid Other | Admitting: Family Medicine

## 2023-09-12 ENCOUNTER — Encounter: Payer: Self-pay | Admitting: Family Medicine

## 2023-09-12 ENCOUNTER — Other Ambulatory Visit: Payer: Self-pay | Admitting: Family Medicine

## 2023-09-12 ENCOUNTER — Ambulatory Visit: Payer: Medicaid Other | Admitting: Family Medicine

## 2023-09-12 VITALS — BP 128/85 | HR 80 | Ht 69.0 in | Wt 253.0 lb

## 2023-09-12 DIAGNOSIS — Z0001 Encounter for general adult medical examination with abnormal findings: Secondary | ICD-10-CM

## 2023-09-12 DIAGNOSIS — R7303 Prediabetes: Secondary | ICD-10-CM

## 2023-09-12 DIAGNOSIS — F411 Generalized anxiety disorder: Secondary | ICD-10-CM | POA: Diagnosis not present

## 2023-09-12 DIAGNOSIS — J209 Acute bronchitis, unspecified: Secondary | ICD-10-CM

## 2023-09-12 DIAGNOSIS — Z0000 Encounter for general adult medical examination without abnormal findings: Secondary | ICD-10-CM

## 2023-09-12 DIAGNOSIS — F339 Major depressive disorder, recurrent, unspecified: Secondary | ICD-10-CM | POA: Diagnosis not present

## 2023-09-12 DIAGNOSIS — K219 Gastro-esophageal reflux disease without esophagitis: Secondary | ICD-10-CM | POA: Diagnosis not present

## 2023-09-12 DIAGNOSIS — F5104 Psychophysiologic insomnia: Secondary | ICD-10-CM

## 2023-09-12 DIAGNOSIS — N951 Menopausal and female climacteric states: Secondary | ICD-10-CM | POA: Diagnosis not present

## 2023-09-12 DIAGNOSIS — R0681 Apnea, not elsewhere classified: Secondary | ICD-10-CM | POA: Insufficient documentation

## 2023-09-12 DIAGNOSIS — Z23 Encounter for immunization: Secondary | ICD-10-CM | POA: Diagnosis not present

## 2023-09-12 MED ORDER — PREDNISONE 50 MG PO TABS
50.0000 mg | ORAL_TABLET | Freq: Every day | ORAL | 0 refills | Status: DC
Start: 2023-09-12 — End: 2023-11-27

## 2023-09-12 MED ORDER — TRAZODONE HCL 100 MG PO TABS
100.0000 mg | ORAL_TABLET | Freq: Every day | ORAL | 3 refills | Status: DC
Start: 2023-09-12 — End: 2024-08-10

## 2023-09-12 MED ORDER — CITALOPRAM HYDROBROMIDE 10 MG PO TABS: 10.0000 mg | ORAL_TABLET | Freq: Every day | ORAL | 3 refills | Status: DC

## 2023-09-12 MED ORDER — CITALOPRAM HYDROBROMIDE 20 MG PO TABS: 20.0000 mg | ORAL_TABLET | Freq: Every day | ORAL | 3 refills | Status: DC

## 2023-09-12 MED ORDER — ALBUTEROL SULFATE HFA 108 (90 BASE) MCG/ACT IN AERS
2.0000 | INHALATION_SPRAY | Freq: Four times a day (QID) | RESPIRATORY_TRACT | 2 refills | Status: DC | PRN
Start: 2023-09-12 — End: 2024-08-12

## 2023-09-12 MED ORDER — SUCRALFATE 1 G PO TABS
1.0000 g | ORAL_TABLET | Freq: Three times a day (TID) | ORAL | 3 refills | Status: DC
Start: 2023-09-12 — End: 2024-08-10

## 2023-09-12 MED ORDER — BUSPIRONE HCL 5 MG PO TABS
5.0000 mg | ORAL_TABLET | Freq: Three times a day (TID) | ORAL | 3 refills | Status: DC
Start: 2023-09-12 — End: 2024-08-10

## 2023-09-12 MED ORDER — HYDROCOD POLI-CHLORPHE POLI ER 10-8 MG/5ML PO SUER
5.0000 mL | Freq: Every evening | ORAL | 0 refills | Status: DC | PRN
Start: 2023-09-12 — End: 2023-11-27

## 2023-09-12 MED ORDER — HYDROCOD POLI-CHLORPHE POLI ER 10-8 MG/5ML PO SUER
5.0000 mL | Freq: Every evening | ORAL | 0 refills | Status: DC | PRN
Start: 2023-09-12 — End: 2023-09-12

## 2023-09-12 NOTE — Assessment & Plan Note (Signed)
Previously A1c 5.6% repeat A1c today Weight gain noted Continue to recommend balanced, lower carb meals. Smaller meal size, adding snacks. Choosing water as drink of choice and increasing purposeful exercise.

## 2023-09-12 NOTE — Assessment & Plan Note (Signed)
Chronic, stable Continue pepcid 20 mg BID with carafate 1 g QID to assist

## 2023-09-12 NOTE — Assessment & Plan Note (Signed)
Chronic, stable Continue medications as previously prescribed

## 2023-09-12 NOTE — Assessment & Plan Note (Signed)
Chronic, stable Defer medication changes at this time

## 2023-09-12 NOTE — Assessment & Plan Note (Signed)
Chronic, stable Continue nightly trazodone to assist

## 2023-09-12 NOTE — Assessment & Plan Note (Signed)

## 2023-09-12 NOTE — Progress Notes (Signed)
Complete physical exam   Patient: Melanie Whitaker   DOB: 1986-07-24   37 y.o. Female  MRN: [id number] Visit Date: 09/12/2023  Today's healthcare provider: Jacky Kindle, FNP  Re Introduced to nurse practitioner role and practice setting.  All questions answered.  Discussed provider/patient relationship and expectations.  Chief Complaint  Patient presents with   Annual Exam   Subjective    Melanie Whitaker is a 37 y.o. female who presents today for a complete physical exam.  She reports consuming a low sodium diet. The patient does not participate in regular exercise at present. She generally feels well. She reports sleeping well. She does have additional problems to discuss today.   HPI HPI   Last pap--in June Last edited by Shelly Bombard, CMA on 09/12/2023 10:15 AM.      Past Medical History:  Diagnosis Date   Anemia    Anxiety    Depression    GERD (gastroesophageal reflux disease)    Manic depression (HCC)    Pneumonia    covid   Past Surgical History:  Procedure Laterality Date   APPENDECTOMY     CHOLECYSTECTOMY     LAPAROSCOPIC VAGINAL HYSTERECTOMY WITH SALPINGECTOMY Bilateral 05/05/2023   Procedure: LAPAROSCOPIC ASSISTED VAGINAL HYSTERECTOMY WITH BILATERAL SALPINGECTOMY;  Surgeon: Hildred Laser, MD;  Location: [facility];  Service: Gynecology;  Laterality: Bilateral;   TONSILLECTOMY     TUBAL LIGATION     Social History   Socioeconomic History   Marital status: Single    Spouse name: Not on file   Number of children: 3   Years of education: Not on file   Highest education level: 9th grade  Occupational History   Not on file  Tobacco Use   Smoking status: Never   Smokeless tobacco: Never  Vaping Use   Vaping status: Never Used  Substance and Sexual Activity   Alcohol use: No   Drug use: Never   Sexual activity: Yes    Comment: tubaligation  Other Topics Concern   Not on file  Social History Narrative   Not on file   Social Determinants of Health    Financial Resource Strain: Low Risk  (09/08/2023)   Overall Financial Resource Strain (CARDIA)    Difficulty of Paying Living Expenses: Not hard at all  Food Insecurity: No Food Insecurity (09/08/2023)   Hunger Vital Sign    Worried About Running Out of Food in the Last Year: Never true    Ran Out of Food in the Last Year: Never true  Transportation Needs: No Transportation Needs (09/08/2023)   PRAPARE - Administrator, Civil Service (Medical): No    Lack of Transportation (Non-Medical): No  Physical Activity: Insufficiently Active (09/08/2023)   Exercise Vital Sign    Days of Exercise per Week: 4 days    Minutes of Exercise per Session: 30 min  Stress: No Stress Concern Present (09/08/2023)   [employer] of Occupational Health - Occupational Stress Questionnaire    Feeling of Stress : Not at all  Social Connections: Socially Isolated (09/08/2023)   Social Connection and Isolation Panel [NHANES]    Frequency of Communication with Friends and Family: Once a week    Frequency of Social Gatherings with Friends and Family: Never    Attends Religious Services: Never    Database administrator or Organizations: No    Attends Engineer, structural: Not on file    Marital Status: Living with  partner  Intimate Partner Violence: Not At Risk (12/09/2018)   Humiliation, Afraid, Rape, and Kick questionnaire    Fear of Current or Ex-Partner: No    Emotionally Abused: No    Physically Abused: No    Sexually Abused: No   Family Status  Relation Name Status   Mother  (Not Specified)   Father  (Not Specified)   MGM  (Not Specified)   MGF  (Not Specified)   PGM  (Not Specified)   PGF  (Not Specified)   Daughter  (Not Specified)   Son  (Not Specified)   Mat Aunt  (Not Specified)   Mat Uncle  (Not Specified)   Nutritional therapist  (Not Specified)  No partnership data on file   Family History  Problem Relation Age of Onset   COPD Mother    Uterine cancer Mother 30    Colon cancer Mother 29       MSH2+ Lynch syndrome   Diabetes Father    COPD Father    Crohn's disease Father    Heart attack Father    Colon cancer Maternal Grandmother    Diabetes Maternal Grandfather    Diabetes Paternal Grandmother    Heart failure Paternal Grandmother    Dementia Paternal Grandmother    Heart failure Paternal Grandfather    Lung cancer Paternal Grandfather    Diabetes Daughter    ADD / ADHD Son    Stroke Maternal Aunt    Crohn's disease Maternal Uncle    Diabetes Paternal Uncle    Crohn's disease Paternal Uncle    Allergies  Allergen Reactions   Lexapro [Escitalopram Oxalate] Other (See Comments)    SI during use   Sulfa Antibiotics Anaphylaxis   Penicillins    Phenergan [Promethazine Hcl] Other (See Comments)    halluciations     Patient Care Team: Jacky Kindle, FNP as PCP - General (Family Medicine)   Medications: Outpatient Medications Prior to Visit  Medication Sig   famotidine (PEPCID) 20 MG tablet TAKE 1 TABLET BY MOUTH TWICE A DAY   [DISCONTINUED] busPIRone (BUSPAR) 5 MG tablet Take 1 tablet (5 mg total) by mouth 3 (three) times daily.   [DISCONTINUED] citalopram (CELEXA) 10 MG tablet Take 1 tablet (10 mg total) by mouth daily. Take in addition to 20 mg for total of 30 mg daily.   [DISCONTINUED] citalopram (CELEXA) 20 MG tablet Take 1 tablet (20 mg total) by mouth daily.   [DISCONTINUED] docusate sodium (COLACE) 100 MG capsule Take 1 capsule (100 mg total) by mouth 2 (two) times daily as needed.   [DISCONTINUED] ibuprofen (ADVIL) 600 MG tablet Take 1 tablet (600 mg total) by mouth every 6 (six) hours as needed.   [DISCONTINUED] sucralfate (CARAFATE) 1 g tablet TAKE 1 TABLET (1 G TOTAL) BY MOUTH 4 TIMES A DAY WITH MEALS AND AT BEDTIME   [DISCONTINUED] traZODone (DESYREL) 100 MG tablet Take 1 tablet (100 mg total) by mouth at bedtime.   No facility-administered medications prior to visit.    Review of Systems Last CBC Lab Results   Component Value Date   WBC 12.2 (H) 05/20/2023   HGB 12.5 05/20/2023   HCT 38.6 05/20/2023   MCV 87.9 05/20/2023   MCH 28.5 05/20/2023   RDW 13.8 05/20/2023   PLT 374 05/20/2023   Last metabolic panel Lab Results  Component Value Date   GLUCOSE 163 (H) 05/20/2023   NA 136 05/20/2023   K 4.1 05/20/2023   CL 102 05/20/2023  CO2 24 05/20/2023   BUN 11 05/20/2023   CREATININE 0.80 05/20/2023   GFRNONAA >60 05/20/2023   CALCIUM 9.0 05/20/2023   PROT 8.1 05/20/2023   ALBUMIN 4.0 05/20/2023   LABGLOB 3.0 12/20/2022   AGRATIO 1.4 12/20/2022   BILITOT 0.4 05/20/2023   ALKPHOS 99 05/20/2023   AST 25 05/20/2023   ALT 28 05/20/2023   ANIONGAP 10 05/20/2023   Last lipids Lab Results  Component Value Date   CHOL 174 12/20/2022   HDL 43 12/20/2022   LDLCALC 110 (H) 12/20/2022   TRIG 114 12/20/2022   CHOLHDL 4.0 12/20/2022   Last hemoglobin A1c Lab Results  Component Value Date   HGBA1C 5.6 12/20/2022   Last thyroid functions Lab Results  Component Value Date   TSH 0.838 12/20/2022   Last vitamin D Lab Results  Component Value Date   VD25OH 23.2 (L) 12/20/2022   Last vitamin B12 and Folate Lab Results  Component Value Date   VITAMINB12 471 12/20/2022   FOLATE 6.8 12/20/2022    Objective    BP 128/85 (BP Location: Right Arm, Patient Position: Sitting, Cuff Size: Large)   Pulse 80   Ht 5\' 9"  (1.753 m)   Wt 253 lb (114.8 kg)   SpO2 98%   BMI 37.36 kg/m   BP Readings from Last 3 Encounters:  09/12/23 128/85  06/11/23 (!) 117/59  05/28/23 130/85   Wt Readings from Last 3 Encounters:  09/12/23 253 lb (114.8 kg)  06/11/23 238 lb 3.2 oz (108 kg)  05/28/23 240 lb (108.9 kg)   SpO2 Readings from Last 3 Encounters:  09/12/23 98%  05/20/23 99%  05/05/23 97%   Physical Exam Vitals and nursing note reviewed.  Constitutional:      General: She is awake. She is not in acute distress.    Appearance: Normal appearance. She is well-developed and well-groomed.  She is obese. She is not ill-appearing, toxic-appearing or diaphoretic.  HENT:     Head: Normocephalic and atraumatic.     Jaw: There is normal jaw occlusion. No trismus, tenderness, swelling or pain on movement.     Right Ear: Hearing, tympanic membrane, ear canal and external ear normal. There is no impacted cerumen.     Left Ear: Hearing, tympanic membrane, ear canal and external ear normal. There is no impacted cerumen.     Nose: Nose normal. No congestion or rhinorrhea.     Right Turbinates: Not enlarged, swollen or pale.     Left Turbinates: Not enlarged, swollen or pale.     Right Sinus: No maxillary sinus tenderness or frontal sinus tenderness.     Left Sinus: No maxillary sinus tenderness or frontal sinus tenderness.     Mouth/Throat:     Lips: Pink.     Mouth: Mucous membranes are moist. No injury.     Tongue: No lesions.     Pharynx: Oropharynx is clear. Uvula midline. No pharyngeal swelling, oropharyngeal exudate, posterior oropharyngeal erythema or uvula swelling.     Tonsils: No tonsillar exudate or tonsillar abscesses.  Eyes:     General: Lids are normal. Lids are everted, no foreign bodies appreciated. Vision grossly intact. Gaze aligned appropriately. No allergic shiner or visual field deficit.       Right eye: No discharge.        Left eye: No discharge.     Extraocular Movements: Extraocular movements intact.     Conjunctiva/sclera: Conjunctivae normal.     Right eye: Right conjunctiva is not  injected. No exudate.    Left eye: Left conjunctiva is not injected. No exudate.    Pupils: Pupils are equal, round, and reactive to light.  Neck:     Thyroid: No thyroid mass, thyromegaly or thyroid tenderness.     Vascular: No carotid bruit.     Trachea: Trachea normal.  Cardiovascular:     Rate and Rhythm: Normal rate and regular rhythm.     Pulses: Normal pulses.          Carotid pulses are 2+ on the right side and 2+ on the left side.      Radial pulses are 2+ on the  right side and 2+ on the left side.       Dorsalis pedis pulses are 2+ on the right side and 2+ on the left side.       Posterior tibial pulses are 2+ on the right side and 2+ on the left side.     Heart sounds: Normal heart sounds, S1 normal and S2 normal. No murmur heard.    No friction rub. No gallop.  Pulmonary:     Effort: Pulmonary effort is normal. No respiratory distress.     Breath sounds: Normal air entry. No stridor. Wheezing present. No rhonchi or rales.     Comments: Acute cough Chest:     Chest wall: Tenderness present.     Comments: Breasts: breasts appear normal, no suspicious masses, no skin or nipple changes or axillary nodes, right breast normal without mass, skin or nipple changes or axillary nodes, left breast normal without mass, skin or nipple changes or axillary nodes, risk and benefit of breast self-exam was discussed  Abdominal:     General: Abdomen is flat. Bowel sounds are normal. There is no distension.     Palpations: Abdomen is soft. There is no mass.     Tenderness: There is no abdominal tenderness. There is no right CVA tenderness, left CVA tenderness, guarding or rebound.     Hernia: No hernia is present.  Genitourinary:    Comments: Exam deferred; denies complaints Musculoskeletal:        General: No swelling, tenderness, deformity or signs of injury. Normal range of motion.     Cervical back: Full passive range of motion without pain, normal range of motion and neck supple. No edema, rigidity or tenderness. No muscular tenderness.     Right lower leg: No edema.     Left lower leg: No edema.  Lymphadenopathy:     Cervical: No cervical adenopathy.     Right cervical: No superficial, deep or posterior cervical adenopathy.    Left cervical: No superficial, deep or posterior cervical adenopathy.  Skin:    General: Skin is warm and dry.     Capillary Refill: Capillary refill takes less than 2 seconds.     Coloration: Skin is not jaundiced or pale.      Findings: No bruising, erythema, lesion or rash.  Neurological:     General: No focal deficit present.     Mental Status: She is alert and oriented to person, place, and time. Mental status is at baseline.     GCS: GCS eye subscore is 4. GCS verbal subscore is 5. GCS motor subscore is 6.     Sensory: Sensation is intact. No sensory deficit.     Motor: Motor function is intact. No weakness.     Coordination: Coordination is intact. Coordination normal.     Gait: Gait is intact. Gait normal.  Psychiatric:        Attention and Perception: Attention and perception normal.        Mood and Affect: Mood and affect normal.        Speech: Speech normal.        Behavior: Behavior normal. Behavior is cooperative.        Thought Content: Thought content normal.        Cognition and Memory: Cognition and memory normal.        Judgment: Judgment normal.     Last depression screening scores    09/12/2023   10:15 AM 02/21/2023    8:43 AM 12/20/2022    1:57 PM  PHQ 2/9 Scores  PHQ - 2 Score 0 3 6  PHQ- 9 Score 0 9 15   Last fall risk screening    03/28/2023    9:23 AM  Fall Risk   Falls in the past year? 0  Number falls in past yr: 0  Injury with Fall? 0  Risk for fall due to : No Fall Risks  Follow up Falls evaluation completed   Last Audit-C alcohol use screening    09/08/2023   10:15 AM  Alcohol Use Disorder Test (AUDIT)  1. How often do you have a drink containing alcohol? 0  2. How many drinks containing alcohol do you have on a typical day when you are drinking? 0  3. How often do you have six or more drinks on one occasion? 0  AUDIT-C Score 0   A score of 3 or more in women, and 4 or more in men indicates increased risk for alcohol abuse, EXCEPT if all of the points are from question 1   No results found for any visits on 09/12/23.  Assessment & Plan    Routine Health Maintenance and Physical Exam  Exercise Activities and Dietary recommendations  Goals   None      Immunization History  Administered Date(s) Administered   H1N1 12/19/2008   Hepatitis B, PED/ADOLESCENT 08/02/1996, 09/06/1996, 02/14/1997   Influenza, Seasonal, Injecte, Preservative Fre 09/12/2023   Influenza-Unspecified 12/19/2008, 07/18/2009, 09/18/2011   Tdap 02/11/2012, 12/20/2022    Health Maintenance  Topic Date Due   COVID-19 Vaccine (1) Never done   Cervical Cancer Screening (HPV/Pap Cotest)  02/21/2028   DTaP/Tdap/Td (3 - Td or Tdap) 12/19/2032   INFLUENZA VACCINE  Completed   Hepatitis C Screening  Completed   HIV Screening  Completed   HPV VACCINES  Aged Out    Discussed health benefits of physical activity, and encouraged her to engage in regular exercise appropriate for her age and condition.  Problem List Items Addressed This Visit       Cardiovascular and Mediastinum   Perimenopausal vasomotor symptoms    Chronic, stable Continue to monitor Retained ovaries from partial hysterectomy         Respiratory   Acute bronchitis    Acute, interfering with sleep and exercise d/t coughing Recommend steroids, cough syrup and inhaler       Relevant Medications   chlorpheniramine-HYDROcodone (TUSSIONEX) 10-8 MG/5ML   predniSONE (DELTASONE) 50 MG tablet   albuterol (VENTOLIN HFA) 108 (90 Base) MCG/ACT inhaler   GERD with apnea    Chronic, stable Continue pepcid 20 mg BID with carafate 1 g QID to assist       Relevant Medications   sucralfate (CARAFATE) 1 g tablet     Other   Annual physical exam - Primary    Things to  do to keep yourself healthy  - Exercise at least 30-45 minutes a day, 3-4 days a week.  - Eat a low-fat diet with lots of fruits and vegetables, up to 7-9 servings per day.  - Seatbelts can save your life. Wear them always.  - Smoke detectors on every level of your home, check batteries every year.  - Eye Doctor - have an eye exam every 1-2 years  - Safe sex - if you may be exposed to STDs, use a condom.  - Alcohol -  If you drink,  do it moderately, less than 2 drinks per day.  - Health Care Power of Attorney. Choose someone to speak for you if you are not able.  - Depression is common in our stressful world.If you're feeling down or losing interest in things you normally enjoy, please come in for a visit.  - Violence - If anyone is threatening or hurting you, please call immediately.       Relevant Orders   CBC with Differential/Platelet   Comprehensive metabolic panel   Lipid panel   TSH   Borderline diabetes    Previously A1c 5.6% repeat A1c today Weight gain noted Continue to recommend balanced, lower carb meals. Smaller meal size, adding snacks. Choosing water as drink of choice and increasing purposeful exercise.       Relevant Orders   Hemoglobin A1c   Depression, recurrent (HCC)    Chronic, stable Defer medication changes at this time       Relevant Medications   busPIRone (BUSPAR) 5 MG tablet   citalopram (CELEXA) 10 MG tablet   citalopram (CELEXA) 20 MG tablet   traZODone (DESYREL) 100 MG tablet   Encounter for immunization   Relevant Orders   Flu vaccine trivalent PF, 6mos and older(Flulaval,Afluria,Fluarix,Fluzone) (Completed)   GAD (generalized anxiety disorder)    Chronic, stable Continue medications as previously prescribed       Relevant Medications   busPIRone (BUSPAR) 5 MG tablet   citalopram (CELEXA) 10 MG tablet   citalopram (CELEXA) 20 MG tablet   traZODone (DESYREL) 100 MG tablet   Psychophysiological insomnia    Chronic, stable Continue nightly trazodone to assist       Relevant Medications   traZODone (DESYREL) 100 MG tablet   Return in about 1 year (around 09/11/2024), or if symptoms worsen or fail to improve, for annual examination.    Leilani Merl, FNP, have reviewed all documentation for this visit. The documentation on 09/12/23 for the exam, diagnosis, procedures, and orders are all accurate and complete.  Jacky Kindle, FNP  [facility] [phone] (phone) [phone] (fax)  [facility]

## 2023-09-12 NOTE — Assessment & Plan Note (Signed)
Chronic, stable Continue to monitor Retained ovaries from partial hysterectomy

## 2023-09-12 NOTE — Assessment & Plan Note (Signed)
Acute, interfering with sleep and exercise d/t coughing Recommend steroids, cough syrup and inhaler

## 2023-09-13 LAB — CBC WITH DIFFERENTIAL/PLATELET
Basophils Absolute: 0 10*3/uL (ref 0.0–0.2)
Basos: 0 %
EOS (ABSOLUTE): 0.2 10*3/uL (ref 0.0–0.4)
Eos: 3 %
Hematocrit: 41.1 % (ref 34.0–46.6)
Hemoglobin: 13.5 g/dL (ref 11.1–15.9)
Immature Grans (Abs): 0 10*3/uL (ref 0.0–0.1)
Immature Granulocytes: 0 %
Lymphocytes Absolute: 2.6 10*3/uL (ref 0.7–3.1)
Lymphs: 29 %
MCH: 28.7 pg (ref 26.6–33.0)
MCHC: 32.8 g/dL (ref 31.5–35.7)
MCV: 87 fL (ref 79–97)
Monocytes Absolute: 0.7 10*3/uL (ref 0.1–0.9)
Monocytes: 7 %
Neutrophils Absolute: 5.6 10*3/uL (ref 1.4–7.0)
Neutrophils: 61 %
Platelets: 332 10*3/uL (ref 150–450)
RBC: 4.7 x10E6/uL (ref 3.77–5.28)
RDW: 13.3 % (ref 11.7–15.4)
WBC: 9.1 10*3/uL (ref 3.4–10.8)

## 2023-09-13 LAB — COMPREHENSIVE METABOLIC PANEL
ALT: 29 [IU]/L (ref 0–32)
AST: 20 [IU]/L (ref 0–40)
Albumin: 4.5 g/dL (ref 3.9–4.9)
Alkaline Phosphatase: 118 [IU]/L (ref 44–121)
BUN/Creatinine Ratio: 14 (ref 9–23)
BUN: 10 mg/dL (ref 6–20)
Bilirubin Total: 0.5 mg/dL (ref 0.0–1.2)
CO2: 24 mmol/L (ref 20–29)
Calcium: 9.7 mg/dL (ref 8.7–10.2)
Chloride: 101 mmol/L (ref 96–106)
Creatinine, Ser: 0.72 mg/dL (ref 0.57–1.00)
Globulin, Total: 2.9 g/dL (ref 1.5–4.5)
Glucose: 78 mg/dL (ref 70–99)
Potassium: 4.2 mmol/L (ref 3.5–5.2)
Sodium: 139 mmol/L (ref 134–144)
Total Protein: 7.4 g/dL (ref 6.0–8.5)
eGFR: 109 mL/min/{1.73_m2} (ref >59)

## 2023-09-13 LAB — LIPID PANEL
Chol/HDL Ratio: 4.2 ratio (ref 0.0–4.4)
Cholesterol, Total: 195 mg/dL (ref 100–199)
HDL: 46 mg/dL (ref >39)
LDL Chol Calc (NIH): 126 mg/dL — ABNORMAL HIGH (ref 0–99)
Triglycerides: 130 mg/dL (ref 0–149)
VLDL Cholesterol Cal: 23 mg/dL (ref 5–40)

## 2023-09-13 LAB — TSH: TSH: 0.587 u[IU]/mL (ref 0.450–4.500)

## 2023-09-13 LAB — HEMOGLOBIN A1C
Est. average glucose Bld gHb Est-mCnc: 117 mg/dL
Hgb A1c MFr Bld: 5.7 % — ABNORMAL HIGH (ref 4.8–5.6)

## 2023-09-23 ENCOUNTER — Other Ambulatory Visit: Payer: Self-pay | Admitting: Family Medicine

## 2023-09-23 MED ORDER — AZITHROMYCIN 250 MG PO TABS: ORAL_TABLET | ORAL | 0 refills | Status: AC

## 2023-10-02 ENCOUNTER — Other Ambulatory Visit: Payer: Self-pay | Admitting: Family Medicine

## 2023-10-02 MED ORDER — TRIAMCINOLONE ACETONIDE 0.5 % EX OINT: 1.0000 | TOPICAL_OINTMENT | Freq: Two times a day (BID) | CUTANEOUS | 0 refills | Status: DC

## 2023-10-23 DIAGNOSIS — L97529 Non-pressure chronic ulcer of other part of left foot with unspecified severity: Secondary | ICD-10-CM | POA: Diagnosis not present

## 2023-10-23 DIAGNOSIS — E11621 Type 2 diabetes mellitus with foot ulcer: Secondary | ICD-10-CM | POA: Diagnosis not present

## 2023-10-23 NOTE — Telephone Encounter (Signed)
**Note De-identified  [place] Obfuscation** Please advise 

## 2023-11-27 ENCOUNTER — Ambulatory Visit: Payer: Medicaid Other | Admitting: Family Medicine

## 2023-11-27 VITALS — BP 111/83 | HR 76 | Ht 69.0 in | Wt 242.2 lb

## 2023-11-27 DIAGNOSIS — R7303 Prediabetes: Secondary | ICD-10-CM | POA: Diagnosis not present

## 2023-11-27 DIAGNOSIS — R0681 Apnea, not elsewhere classified: Secondary | ICD-10-CM | POA: Diagnosis not present

## 2023-11-27 DIAGNOSIS — Z6835 Body mass index (BMI) 35.0-35.9, adult: Secondary | ICD-10-CM

## 2023-11-27 DIAGNOSIS — Z1231 Encounter for screening mammogram for malignant neoplasm of breast: Secondary | ICD-10-CM

## 2023-11-27 DIAGNOSIS — K219 Gastro-esophageal reflux disease without esophagitis: Secondary | ICD-10-CM

## 2023-11-27 DIAGNOSIS — E66812 Obesity, class 2: Secondary | ICD-10-CM | POA: Diagnosis not present

## 2023-11-27 DIAGNOSIS — F5104 Psychophysiologic insomnia: Secondary | ICD-10-CM | POA: Diagnosis not present

## 2023-11-27 DIAGNOSIS — F339 Major depressive disorder, recurrent, unspecified: Secondary | ICD-10-CM

## 2023-11-27 DIAGNOSIS — F411 Generalized anxiety disorder: Secondary | ICD-10-CM

## 2023-11-27 MED ORDER — OMEPRAZOLE 40 MG PO CPDR: 40.0000 mg | DELAYED_RELEASE_CAPSULE | Freq: Every day | ORAL | 3 refills | Status: DC

## 2023-11-27 NOTE — Progress Notes (Signed)
 Established Patient Office Visit  Introduced to nurse practitioner role and practice setting.  All questions answered.  Discussed provider/patient relationship and expectations.   Subjective   Patient ID: Melanie Whitaker, female    DOB: 05-27-1986  Age: 38 y.o. MRN: [id number]  Chief Complaint  Patient presents with   Medical Management of Chronic Issues    Needs A1C checked    Melanie Whitaker is a 38 year old female who presents for a routine follow-up and medication review.  She has prediabetes with a recent A1c of 5.7% and is due for a recheck after December 13, 2023. There is a family history of type 2 diabetes, including her daughter who was diagnosed at age 15.  She experiences gastrointestinal symptoms including a burning sensation in her stomach and nausea, which occur regardless of food intake. There is a sensation of food getting stuck in her throat, particularly with rice and bread. No vomiting, but persistent nausea and burning are present. She has a history of stomach issues and has had her gallbladder and appendix removed. Current medications for gastric issues include Pepcid  and Carafate .  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   Mood - She takes BuSpar  three times a day, Celexa  30 mg in the morning, and trazodone  as needed for sleep.  She experiences severe eczema, particularly in the morning, with dryness and burning. She uses triamcinolone  and finds relief with Amish soap - controlled today  Her family history is significant for cancer, with her mother currently battling colon cancer and having a history of uterine cancer and Lynch syndrome. Her father passed away from a heart attack at age 37, and her paternal relatives have a history of Crohn's disease.        11/27/2023    1:05 PM 09/12/2023   10:15 AM 02/21/2023    8:43 AM  Depression screen PHQ  2/9  Decreased Interest 0 0 1  Down, Depressed, Hopeless 0 0 2  PHQ - 2 Score 0 0 3  Altered sleeping 1 0 1  Tired, decreased energy 1 0 2  Change in appetite 1 0 0  Feeling bad or failure about yourself  0 0 1  Trouble concentrating 1 0 2  Moving slowly or fidgety/restless 0 0 0  Suicidal thoughts 0 0 0  PHQ-9 Score 4 0 9  Difficult doing work/chores  Not difficult at all Somewhat difficult       11/27/2023    1:05 PM 09/12/2023   10:16 AM 12/20/2022    1:59 PM  GAD 7 : Generalized Anxiety Score  Nervous, Anxious, on Edge 0 2 3  Control/stop worrying 1 1 1   Worry too much - different things 1 1 3   Trouble relaxing 0 1 3  Restless 1 1 1   Easily annoyed or irritable 1 2 3   Afraid - awful might happen 1 0 3  Total GAD 7 Score 5 8 17   Anxiety Difficulty  Somewhat difficult Not difficult at all     Review of Systems  All other systems reviewed and are negative.   Negative unless indicated in HPI   Objective:     BP 111/83   Pulse 76   Ht 5' 9 (1.753 m)   Wt 242 lb 3.2 oz (109.9 kg)   SpO2 100%   BMI 35.77 kg/m    Physical Exam Constitutional:  General: She is not in acute distress.    Appearance: Normal appearance. She is obese. She is not toxic-appearing or diaphoretic.  HENT:     Head: Normocephalic.     Nose: Nose normal.     Mouth/Throat:     Mouth: Mucous membranes are moist.     Pharynx: Oropharynx is clear.  Eyes:     Extraocular Movements: Extraocular movements intact.     Pupils: Pupils are equal, round, and reactive to light.     Comments: Glasses present  Neck:     Vascular: No carotid bruit.  Cardiovascular:     Rate and Rhythm: Normal rate and regular rhythm.     Pulses: Normal pulses.     Heart sounds: Normal heart sounds. No murmur heard.    No friction rub. No gallop.  Pulmonary:     Effort: No respiratory distress.     Breath sounds: No stridor. No wheezing, rhonchi or rales.  Chest:     Chest wall: No tenderness.  Abdominal:      Tenderness: There is no right CVA tenderness or left CVA tenderness.  Musculoskeletal:     Right lower leg: No edema.     Left lower leg: No edema.  Skin:    General: Skin is warm and dry.     Capillary Refill: Capillary refill takes less than 2 seconds.  Neurological:     General: No focal deficit present.     Mental Status: She is alert and oriented to person, place, and time. Mental status is at baseline.  Psychiatric:        Mood and Affect: Mood normal.        Behavior: Behavior normal.        Thought Content: Thought content normal.        Judgment: Judgment normal.    No results found for any visits on 11/27/23.    The 10-year ASCVD risk score (Arnett DK, et al., 2019) is: 0.6%    Assessment & Plan:  Prediabetes Assessment & Plan: Last A1c was 5.7, indicating prediabetes.  Family history of type 2 diabetes. -Order A1c to be drawn after December 13, 2023. -Continue diet and lifestyle modifications.  Orders: -     Hemoglobin A1c; Future  Screening mammogram for breast cancer -     3D Screening Mammogram, Left and Right; Future  GERD with apnea Assessment & Plan: Reports of food feeling stuck, burning sensation, and severe nausea.  Current treatment with Pepcid  not effective. -Discontinue Pepcid  and start Omeprazole  40mg  daily -continue carafate . -Refer to Gastroenterology for further evaluation and to rule out gastric ulcers.  Orders: -     Omeprazole ; Take 1 capsule (40 mg total) by mouth daily.  Dispense: 30 capsule; Refill: 3 -     Ambulatory referral to Gastroenterology  Depression, recurrent (HCC) Assessment & Plan: Chronic, stable Continue medication as previously prescribed Celexa  30 mg with trazodone  100 mg Declines SI or HI     Psychophysiological insomnia Assessment & Plan: Chronic, stable Continue nightly trazodone  to assist      GAD (generalized anxiety disorder) Assessment & Plan: Chronic, stable Mom has Mets colon cancer -  increased worries - currently in hospital. Has support Continue buspar  5 mg TID with celexa  30 mg Denies panic attacks or agitation      Class 2 obesity without serious comorbidity with body mass index (BMI) of 35.0 to 35.9 in adult, unspecified obesity type Assessment & Plan: Chronic, stable Body mass index  is 35.77 Increased stress d/t mother's health Continue to recommend balanced, lower carb meals. Smaller meal size, adding snacks. Choosing water as drink of choice and increasing purposeful exercise.       Return in about 6 months (around 05/26/2024) for annual physical.   I, Curtis DELENA Boom, FNP, have reviewed all documentation for this visit. The documentation on 11/27/23 for the exam, diagnosis, procedures, and orders are all accurate and complete.   Curtis DELENA Boom, FNP

## 2023-11-27 NOTE — Assessment & Plan Note (Signed)
 Chronic, stable Continue nightly trazodone to assist

## 2023-11-27 NOTE — Assessment & Plan Note (Signed)
 Chronic, stable Continue medication as previously prescribed Celexa  30 mg with trazodone  100 mg Declines SI or HI

## 2023-11-27 NOTE — Assessment & Plan Note (Signed)
 Reports of food feeling stuck, burning sensation, and severe nausea.  Current treatment with Pepcid  not effective. -Discontinue Pepcid  and start Omeprazole  40mg  daily -continue carafate . -Refer to Gastroenterology for further evaluation and to rule out gastric ulcers.

## 2023-11-27 NOTE — Assessment & Plan Note (Signed)
 Chronic, stable Mom has Mets colon cancer - increased worries - currently in hospital. Has support Continue buspar  5 mg TID with celexa  30 mg Denies panic attacks or agitation

## 2023-11-27 NOTE — Assessment & Plan Note (Signed)
 Chronic, stable Body mass index is 35.77 Increased stress d/t mother's health Continue to recommend balanced, lower carb meals. Smaller meal size, adding snacks. Choosing water as drink of choice and increasing purposeful exercise.

## 2023-11-27 NOTE — Assessment & Plan Note (Signed)
 Last A1c was 5.7, indicating prediabetes.  Family history of type 2 diabetes. -Order A1c to be drawn after December 13, 2023. -Continue diet and lifestyle modifications.

## 2023-12-05 ENCOUNTER — Ambulatory Visit
Admission: RE | Admit: 2023-12-05 | Discharge: 2023-12-05 | Disposition: A | Discharge status: Home / Self Care | Payer: Medicaid Other | Source: Ambulatory Visit | Attending: Family Medicine | Admitting: Family Medicine

## 2023-12-05 DIAGNOSIS — Z1231 Encounter for screening mammogram for malignant neoplasm of breast: Secondary | ICD-10-CM | POA: Insufficient documentation

## 2023-12-10 ENCOUNTER — Other Ambulatory Visit: Payer: Self-pay | Admitting: Medical Genetics

## 2023-12-19 ENCOUNTER — Other Ambulatory Visit: Payer: Medicaid Other | Attending: Medical Genetics

## 2023-12-29 ENCOUNTER — Telehealth: Payer: Self-pay | Admitting: Family Medicine

## 2023-12-29 NOTE — Telephone Encounter (Signed)
[facility] faxed refill request for the following medications:  famotidine (PEPCID) 20 MG tablet   Please advise.

## 2023-12-30 NOTE — Telephone Encounter (Signed)
 No longer taking. Prescribed omeprazole.

## 2024-02-06 ENCOUNTER — Other Ambulatory Visit: Payer: Self-pay

## 2024-02-06 DIAGNOSIS — R7303 Prediabetes: Secondary | ICD-10-CM

## 2024-02-07 LAB — HEMOGLOBIN A1C
Est. average glucose Bld gHb Est-mCnc: 114 mg/dL
Hgb A1c MFr Bld: 5.6 % (ref 4.8–5.6)

## 2024-02-09 ENCOUNTER — Encounter: Payer: Self-pay | Admitting: Family Medicine

## 2024-02-09 NOTE — Telephone Encounter (Signed)
 Can you please review to see if this pt needs an appt here before a referral?  Looks like she has prediabetes.    Please advise

## 2024-02-09 NOTE — Telephone Encounter (Signed)
 Called and spoke to the pt. She has been informed since this is a new concern she would need to be seen in clinic before a referral can be placed. She verbally stayed she understood and has agreed to be seen. Can someone please reach out to offer her an appt for discuss this concern

## 2024-02-09 NOTE — Telephone Encounter (Signed)
 Frankly, Endo does not have the capacity to see folks for prediabetes.  This is something that can be managed by us .  Her A1c can be rechecked on or after 5/6, so recommend that timing of her appt. She seems to have something scheduled with Shann Darnel in May already.

## 2024-02-10 NOTE — Telephone Encounter (Signed)
 Spoke with pt and she agrees that she will discuss starting a medication with Dr. Shann Darnel at her appt on 03/12/2024

## 2024-02-17 ENCOUNTER — Other Ambulatory Visit: Payer: Self-pay | Admitting: Medical Genetics

## 2024-02-20 ENCOUNTER — Other Ambulatory Visit

## 2024-02-20 ENCOUNTER — Other Ambulatory Visit
Admission: RE | Admit: 2024-02-20 | Discharge: 2024-02-20 | Disposition: A | Discharge status: Home / Self Care | Payer: Self-pay | Source: Ambulatory Visit | Attending: Oncology | Admitting: Oncology

## 2024-03-05 LAB — GENECONNECT MOLECULAR SCREEN: Genetic Analysis Overall Interpretation: NEGATIVE

## 2024-03-12 ENCOUNTER — Ambulatory Visit: Admitting: Family Medicine

## 2024-03-12 ENCOUNTER — Ambulatory Visit: Attending: Family Medicine

## 2024-03-12 ENCOUNTER — Encounter: Payer: Self-pay | Admitting: Family Medicine

## 2024-03-12 VITALS — BP 126/72 | HR 63 | Resp 20 | Ht 69.0 in | Wt 252.6 lb

## 2024-03-12 DIAGNOSIS — R202 Paresthesia of skin: Secondary | ICD-10-CM | POA: Diagnosis not present

## 2024-03-12 DIAGNOSIS — R000 Tachycardia, unspecified: Secondary | ICD-10-CM | POA: Diagnosis not present

## 2024-03-12 DIAGNOSIS — R270 Ataxia, unspecified: Secondary | ICD-10-CM

## 2024-03-12 DIAGNOSIS — R200 Anesthesia of skin: Secondary | ICD-10-CM

## 2024-03-12 DIAGNOSIS — R42 Dizziness and giddiness: Secondary | ICD-10-CM

## 2024-03-12 NOTE — Progress Notes (Signed)
 Established patient visit   Patient: Melanie Whitaker   DOB: [birth year]   38 y.o. Female  MRN: [id number] Visit Date: 03/12/2024  Today's healthcare provider: Jeralene Mom, MD   Chief Complaint  Patient presents with   Referral    Unable to get weight to go down, has been going up. Since hysterectomy last year.   Subjective    Discussed the use of AI scribe software for clinical note transcription with the patient, who gave verbal consent to proceed.  History of Present Illness   Melanie Whitaker is a 38 year old female who presents for follow up with obesity, prediabetes, and with a burning sensation in her feet and hands.  She has been experiencing a burning sensation in her feet and hands for over a month, described as feeling like 'walking on charcoal'. The sensation sometimes intensifies with cold water and is accompanied by tingling and occasional loss of feeling in her feet and fingers. The symptoms began suddenly and have persisted without relief from any interventions.  She experiences significant fatigue, particularly after eating, and reports feeling unusually tired throughout the day. She can sleep for extended periods without feeling rested. Recently, she has experienced episodes of dizziness, where everything spins to the right, accompanied by a racing heart and lightheadedness. These episodes have occurred twice in the past week, causing concern at her workplace.  She has been attempting to lose weight due to a family history of diabetes, including her daughter who is diabetic. Despite dietary changes such as eliminating fried foods, increasing salads, and cutting out sugar, she has not seen significant weight loss. She participates in a 'water challenge,' drinking two 64-ounce jugs of water daily.  Her family history includes her father passing away from a heart attack at age 75 and her mother having cancer and heart problems.  She has been trying to manage her weight  since having a hysterectomy last year, which she feels has contributed to weight gain.     Lab Results  Component Value Date   TSH 0.587 09/12/2023    Lab Results  Component Value Date   NA 139 09/12/2023   CL 101 09/12/2023   K 4.2 09/12/2023   CO2 24 09/12/2023   BUN 10 09/12/2023   CREATININE 0.72 09/12/2023   EGFR 109 09/12/2023   CALCIUM 9.7 09/12/2023   ALBUMIN 4.5 09/12/2023   GLUCOSE 78 09/12/2023   Lab Results  Component Value Date   HGBA1C 5.6 02/06/2024     Medications: Outpatient Medications Prior to Visit  Medication Sig   albuterol  (VENTOLIN  HFA) 108 (90 Base) MCG/ACT inhaler Inhale 2 puffs into the lungs every 6 (six) hours as needed for wheezing or shortness of breath.   busPIRone  (BUSPAR ) 5 MG tablet Take 1 tablet (5 mg total) by mouth 3 (three) times daily.   citalopram  (CELEXA ) 10 MG tablet Take 1 tablet (10 mg total) by mouth daily. Take in addition to 20 mg for total of 30 mg daily.   citalopram  (CELEXA ) 20 MG tablet Take 1 tablet (20 mg total) by mouth daily.   omeprazole  (PRILOSEC) 40 MG capsule Take 1 capsule (40 mg total) by mouth daily.   sucralfate  (CARAFATE ) 1 g tablet Take 1 tablet (1 g total) by mouth 4 (four) times daily -  with meals and at bedtime.   traZODone  (DESYREL ) 100 MG tablet Take 1 tablet (100 mg total) by mouth at bedtime.   triamcinolone  ointment (KENALOG ) 0.5 %  Apply 1 Application topically 2 (two) times daily.   No facility-administered medications prior to visit.      Objective    BP 126/72 (BP Location: Right Arm, Patient Position: Sitting, Cuff Size: Large)   Pulse 63   Resp 20   Ht 5\' 9"  (1.753 m)   Wt 252 lb 9.6 oz (114.6 kg)   LMP 03/28/2023 (Exact Date)   SpO2 98%   BMI 37.30 kg/m   Physical Exam   General: Appearance:    Obese female in no acute distress  Eyes:    PERRL, conjunctiva/corneas clear, EOM's intact       Lungs:     Clear to auscultation bilaterally, respirations unlabored  Heart:    Normal heart  rate. Normal rhythm. No murmurs, rubs, or gallops.  2+ pedal and radial pulses.   MS:   All extremities are intact.    Neurologic:   Awake, alert, oriented x 3. No apparent focal neurological defect.         Assessment & Plan        Dizziness and palpitations Recent dizziness, palpitations, and vertigo with family history of heart disease suggest possible cardiac arrhythmias. - Order Zio monitor for 1-week heart rhythm assessment.  Peripheral neuropathy Burning sensation in extremities with tingling and numbness suggests possible vitamin or thyroid deficiencies. - Order blood tests for vitamin B12, vitamin D , and thyroid function.  Obesity Difficulty losing weight with family history of obesity and diabetes. Discussed potential pharmacological intervention with Wegovy or Zepbound, covered by Medicaid. Explained side effects and emphasized fiber and water intake. - Order blood tests for thyroid function and other weight gain causes. - Consider Wegovy or Zepbound if lab results are normal.  Prediabetes A1c at 5.7 with family history and obesity increases diabetes risk. Weight loss medications may help prevent progression. - Monitor A1c regularly. - Consider weight loss medications to prevent diabetes progression.    No follow-ups on file.   Jeralene Mom, MD  [facility] [phone] (phone) [phone] (fax)  [facility]

## 2024-03-13 LAB — COMPREHENSIVE METABOLIC PANEL WITH GFR
ALT: 31 IU/L (ref 0–32)
AST: 21 IU/L (ref 0–40)
Albumin: 4.3 g/dL (ref 3.9–4.9)
Alkaline Phosphatase: 113 IU/L (ref 44–121)
BUN/Creatinine Ratio: 20 (ref 9–23)
BUN: 13 mg/dL (ref 6–24)
Bilirubin Total: 0.3 mg/dL (ref 0.0–1.2)
CO2: 19 mmol/L — ABNORMAL LOW (ref 20–29)
Calcium: 9.3 mg/dL (ref 8.7–10.2)
Chloride: 108 mmol/L — ABNORMAL HIGH (ref 96–106)
Creatinine, Ser: 0.64 mg/dL (ref 0.57–1.00)
Globulin, Total: 2.6 g/dL (ref 1.5–4.5)
Glucose: 98 mg/dL (ref 70–99)
Potassium: 4.3 mmol/L (ref 3.5–5.2)
Sodium: 141 mmol/L (ref 134–144)
Total Protein: 6.9 g/dL (ref 6.0–8.5)
eGFR: 114 mL/min/{1.73_m2} (ref >59)

## 2024-03-13 LAB — CBC
Hematocrit: 39.6 % (ref 34.0–46.6)
Hemoglobin: 12.9 g/dL (ref 11.1–15.9)
MCH: 29.1 pg (ref 26.6–33.0)
MCHC: 32.6 g/dL (ref 31.5–35.7)
MCV: 89 fL (ref 79–97)
Platelets: 314 10*3/uL (ref 150–450)
RBC: 4.44 x10E6/uL (ref 3.77–5.28)
RDW: 13.5 % (ref 11.7–15.4)
WBC: 8.5 10*3/uL (ref 3.4–10.8)

## 2024-03-13 LAB — TSH+FREE T4
Free T4: 0.73 ng/dL — ABNORMAL LOW (ref 0.82–1.77)
TSH: 0.984 u[IU]/mL (ref 0.450–4.500)

## 2024-03-13 LAB — VITAMIN D 25 HYDROXY (VIT D DEFICIENCY, FRACTURES): Vit D, 25-Hydroxy: 32.7 ng/mL (ref 30.0–100.0)

## 2024-03-13 LAB — VITAMIN B12: Vitamin B-12: 447 pg/mL (ref 232–1245)

## 2024-03-16 ENCOUNTER — Encounter: Payer: Self-pay | Admitting: Family Medicine

## 2024-03-16 ENCOUNTER — Telehealth: Payer: Self-pay

## 2024-03-16 DIAGNOSIS — Z6835 Body mass index (BMI) 35.0-35.9, adult: Secondary | ICD-10-CM

## 2024-03-16 NOTE — Telephone Encounter (Signed)
**Note De-identified  [place] Obfuscation** Please advise 

## 2024-03-16 NOTE — Telephone Encounter (Signed)
 Copied from CRM [id number]. Topic: Clinical - Lab/Test Results >> Mar 16, 2024 10:57 AM Melanie Whitaker wrote: Reason for CRM: Pt wants to discuss her lab results, please advise.  Best contact: [phone]

## 2024-03-18 ENCOUNTER — Ambulatory Visit: Payer: Self-pay | Admitting: Family Medicine

## 2024-03-18 ENCOUNTER — Telehealth: Payer: Self-pay

## 2024-03-18 ENCOUNTER — Ambulatory Visit: Payer: Self-pay

## 2024-03-18 NOTE — Telephone Encounter (Signed)
 Copied from CRM [id number]. Topic: Clinical - Lab/Test Results >> Mar 18, 2024  2:06 PM Juluis Ok wrote: Reason for CRM: Patient has questions regarding lab results, no providers note available.   Chief Complaint: Lab Results  Additional Notes: Pt expressed her frustration that she has gotten know follow-up communication about her abnormal results. Per chart review, no communication in lab results. Call transferred to [facility] at [facility] Answer Assessment - Initial Assessment Questions 1. REASON FOR CALL or QUESTION: "What is your reason for calling today?" or "How can I best help you?" or "What question do you have that I can help answer?"     Need instruction on lab results  2. CALLER: Document the source of call. (e.g., laboratory, patient).     patient  Protocols used: PCP Call - No Triage-A-AH

## 2024-03-18 NOTE — Telephone Encounter (Signed)
 Copied from CRM [id number]. Topic: Clinical - Lab/Test Results >> Mar 17, 2024  2:56 PM Ethelle Herb L wrote: Reason for CRM: Patient calling in again about lab results. Patient concerned about her levels and she has also sent mychart message in regards to labs.   Patient states she also received heart monitor and has put it on.   Patient inquiring if another provider could review the results as she is concerned and has not heard from Dr. Shann Darnel.   Okay w/ phone call or mychart message.

## 2024-03-19 NOTE — Telephone Encounter (Signed)
 Copied from CRM [id number]. Topic: Clinical - Lab/Test Results >> Mar 19, 2024  9:03 AM Heather  M wrote: Patient calling in again for lab results. Still nothing available. Office contacted and noted dr Shann Darnel has not went over labs, so there is nothing to relay at this time. Patient requests a call ASAP when the results are available. States she's been waiting a week.

## 2024-03-24 ENCOUNTER — Telehealth: Payer: Self-pay

## 2024-03-24 ENCOUNTER — Other Ambulatory Visit (HOSPITAL_BASED_OUTPATIENT_CLINIC_OR_DEPARTMENT_OTHER): Payer: Self-pay

## 2024-03-24 DIAGNOSIS — E66812 Obesity, class 2: Secondary | ICD-10-CM

## 2024-03-24 MED ORDER — ZEPBOUND 2.5 MG/0.5ML ~~LOC~~ SOAJ: 2.5000 mg | SUBCUTANEOUS | 0 refills | Status: DC

## 2024-03-24 NOTE — Telephone Encounter (Signed)
 Pharmacy Patient Advocate Encounter   Received notification from Onbase that prior authorization for Zepbound 2.5MG /0.5ML pen-injectors is required/requested.   Insurance verification completed.   The patient is insured through [facility] .   Per test claim: PA required; PA submitted to above mentioned insurance via CoverMyMeds Key/confirmation #/EOC [facility] Status is pending

## 2024-03-24 NOTE — Telephone Encounter (Signed)
 Please see the pt response

## 2024-03-25 ENCOUNTER — Telehealth: Payer: Self-pay

## 2024-03-25 MED ORDER — WEGOVY 0.25 MG/0.5ML ~~LOC~~ SOAJ: 0.2500 mg | SUBCUTANEOUS | 0 refills | Status: DC

## 2024-03-25 NOTE — Addendum Note (Signed)
 Addended by: Lamon Pillow on: 03/25/2024 10:45 AM   Modules accepted: Orders

## 2024-03-25 NOTE — Telephone Encounter (Signed)
 Pharmacy Patient Advocate Encounter  Received notification from [facility] that Prior Authorization for Zepbound 2.5MG /0.5ML pen-injectors  has been DENIED.  Full denial letter will be uploaded to the media tab. See denial reason below.   PA #/Case ID/Reference #: [facility]

## 2024-03-25 NOTE — Telephone Encounter (Signed)
 Pharmacy Patient Advocate Encounter   Received notification from Onbase that prior authorization for [facility] 0.25MG /0.5ML auto-injectors  is required/requested.   Insurance verification completed.   The patient is insured through [facility] .   Per test claim: PA required; PA submitted to above mentioned insurance via CoverMyMeds Key/confirmation #/EOC [id number] Status is pending

## 2024-03-26 ENCOUNTER — Other Ambulatory Visit (HOSPITAL_COMMUNITY): Payer: Self-pay

## 2024-03-26 NOTE — Telephone Encounter (Signed)
 Pharmacy Patient Advocate Encounter  Received notification from [facility] that Prior Authorization for [facility] 0.25MG /0.5ML auto-injectors  has been APPROVED from 03/25/24 to 09/24/24. Ran test claim, Copay is $4. This test claim was processed through [facility]- copay amounts may vary at other pharmacies due to pharmacy/plan contracts, or as the patient moves through the different stages of their insurance plan.   PA #/Case ID/Reference #: [id number]

## 2024-03-29 ENCOUNTER — Other Ambulatory Visit: Payer: Self-pay | Admitting: Family Medicine

## 2024-03-29 DIAGNOSIS — E66812 Obesity, class 2: Secondary | ICD-10-CM

## 2024-03-29 NOTE — Telephone Encounter (Unsigned)
 Copied from CRM [id number]. Topic: Clinical - Medication Refill >> Mar 29, 2024  9:45 AM DeAngela L wrote: Medication: Semaglutide -Weight Management (WEGOVY ) 0.25 MG/0.5ML SOAJ  Has the patient contacted their pharmacy? No  (Agent: If no, request that the patient contact the pharmacy for the refill. If patient does not wish to contact the pharmacy document the reason why and proceed with request.) (Agent: If yes, when and what did the pharmacy advise?)  This is the patient's preferred pharmacy:  [facility] [address] ([address]), [place] - [address] [place]) [place] [address] Phone: [phone] Fax: [phone]  Is this the correct pharmacy for this prescription? Yes  If no, delete pharmacy and type the correct one.   Has the prescription been filled recently? Yes   Is the patient out of the medication? No  Has the patient been seen for an appointment in the last year OR does the patient have an upcoming appointment? Yes   Can we respond through MyChart? Yes  Agent: Please be advised that Rx refills may take up to 3 business days. We ask that you follow-up with your pharmacy.

## 2024-03-30 NOTE — Telephone Encounter (Signed)
 Called pharmacy to verify if patient picked up medication. Pharmacy staff reports patient picked up Rx 03/27/24.  Receipt confirmed by pharmacy 03/25/24 at 10:45 am.

## 2024-03-30 NOTE — Telephone Encounter (Signed)
 Requested by patient . Duplicate request. Receipt confirmed by pharmacy 03/25/24 at 10:45 am . Pharmacy contacted and patient picked up Rx 03/27/24. Duplicate request.  Requested Prescriptions  Refused Prescriptions Disp Refills   Semaglutide -Weight Management (WEGOVY ) 0.25 MG/0.5ML SOAJ 2 mL 0    Sig: Inject 0.25 mg into the skin once a week.     Endocrinology:  Diabetes - GLP-1 Receptor Agonists - semaglutide  Passed - 03/30/2024 11:05 AM      Passed - HBA1C in normal range and within 180 days    Hgb A1c MFr Bld  Date Value Ref Range Status  02/06/2024 5.6 4.8 - 5.6 % Final    Comment:             Prediabetes: 5.7 - 6.4          Diabetes: >6.4          Glycemic control for adults with diabetes: <7.0          Passed - Cr in normal range and within 360 days    Creatinine  Date Value Ref Range Status  10/04/2012 0.69 0.60 - 1.30 mg/dL Final   Creatinine, Ser  Date Value Ref Range Status  03/12/2024 0.64 0.57 - 1.00 mg/dL Final         Passed - Valid encounter within last 6 months    Recent Outpatient Visits           2 weeks ago Numbness and tingling of both feet   [facility] Melanie Pillow, MD   4 months ago Prediabetes   [facility] [place], Melanie Colas, FNP       Future Appointments             In 1 month Melanie Whitaker, Melanie Colas, FNP [facility] [place]

## 2024-04-12 DIAGNOSIS — R000 Tachycardia, unspecified: Secondary | ICD-10-CM

## 2024-04-12 DIAGNOSIS — R42 Dizziness and giddiness: Secondary | ICD-10-CM | POA: Diagnosis not present

## 2024-04-22 ENCOUNTER — Other Ambulatory Visit: Payer: Self-pay | Admitting: Family Medicine

## 2024-04-22 ENCOUNTER — Encounter: Payer: Self-pay | Admitting: Family Medicine

## 2024-04-22 ENCOUNTER — Ambulatory Visit: Admitting: Family Medicine

## 2024-04-22 VITALS — BP 131/78 | HR 68 | Temp 97.8°F | Ht 70.0 in | Wt 247.4 lb

## 2024-04-22 DIAGNOSIS — Z87898 Personal history of other specified conditions: Secondary | ICD-10-CM

## 2024-04-22 DIAGNOSIS — Z23 Encounter for immunization: Secondary | ICD-10-CM

## 2024-04-22 DIAGNOSIS — E6609 Other obesity due to excess calories: Secondary | ICD-10-CM

## 2024-04-22 DIAGNOSIS — N951 Menopausal and female climacteric states: Secondary | ICD-10-CM

## 2024-04-22 DIAGNOSIS — G629 Polyneuropathy, unspecified: Secondary | ICD-10-CM

## 2024-04-22 DIAGNOSIS — R7303 Prediabetes: Secondary | ICD-10-CM | POA: Diagnosis not present

## 2024-04-22 DIAGNOSIS — E66812 Obesity, class 2: Secondary | ICD-10-CM

## 2024-04-22 DIAGNOSIS — Z6835 Body mass index (BMI) 35.0-35.9, adult: Secondary | ICD-10-CM

## 2024-04-22 MED ORDER — GABAPENTIN 100 MG PO CAPS: 100.0000 mg | ORAL_CAPSULE | Freq: Three times a day (TID) | ORAL | 1 refills | Status: DC

## 2024-04-22 MED ORDER — SEMAGLUTIDE (1 MG/DOSE) 4 MG/3ML ~~LOC~~ SOPN: 1.0000 mg | PEN_INJECTOR | SUBCUTANEOUS | 3 refills | Status: DC

## 2024-04-22 MED ORDER — WEGOVY 0.5 MG/0.5ML ~~LOC~~ SOAJ: 0.5000 mg | SUBCUTANEOUS | 3 refills | Status: DC

## 2024-04-22 MED ORDER — WEGOVY 1 MG/0.5ML ~~LOC~~ SOAJ: 1.0000 mg | SUBCUTANEOUS | 3 refills | Status: DC

## 2024-04-22 NOTE — Progress Notes (Signed)
 Established Patient Office Visit  Introduced to nurse practitioner role and practice setting.  All questions answered.  Discussed provider/patient relationship and expectations.   Subjective   Patient ID: Melanie Whitaker, female    DOB: 1986-04-20  Age: 38 y.o. MRN: [id number]  Chief Complaint  Patient presents with   Medication Refill    Patient is here for getting a refill regarding Wegovy  reports that her appetite has not decreased at all.  Thinking she needs to increase dosage.  Stated that she seen Dr. Gasper for her hands and her feet for burning.  Her hands she has small little bumps on the lateral part of her fingers.  He stated that she may have neuropathy or a vitamin deficiency.  Patient also reports that she had to wear a heart monitor for 5 days because she tends to get dizzy and gets flutters in heart.     Hpv Vaccine- yes    Discussed the use of AI scribe software for clinical note transcription with the patient, who gave verbal consent to proceed.  History of Present Illness Melanie Whitaker is a 38 year old female who presents for follow-up on Wegovy  treatment and neuropathy symptoms.  She is currently on Wegovy  0.5 mg and has experienced a weight loss of ten pounds since starting the medication. She has increased bowel movements, but no nausea, diarrhea, constipation or gastric reflux.  She experiences persistent numbness, tingling, and burning sensations in her feet and hands, which intensify when exposed to water. The burning sensation is described as feeling like 'flames are under my feet.' She also experiences small blisters that feel like 'little pricklies' in her fingers. These symptoms have been ongoing for a while. Previous blood work to check vitamin levels was normal, and her last A1c was normal. No weakness or dropping objects, but she notes periodic prickly sensations in her fingers.  She has a history of panic disorder since age 82 and manages it with Celexa   30 mg daily, Buspar  as needed, and trazodone  for sleep. She experiences dizziness and attributes it to her panic disorder. She has learned techniques to manage panic attacks, such as focusing on her senses and using cold exposure. Zio patch normal.  She underwent a hysterectomy in July 2024, retaining her ovaries. She was perimenopausal before the surgery and now experiences symptoms like anger and sweating, which she attributes to menopause. She is not interested in hormone therapy.  Her father died of a massive heart attack, which contributes to her anxiety about her symptoms. She is concerned about her thyroid  levels, as previous tests showed a slightly low free T4, but her TSH was normal. She has no history of hair loss or vision changes.       04/22/2024    8:23 AM 03/12/2024    8:21 AM 11/27/2023    1:05 PM  Depression screen PHQ 2/9  Decreased Interest 1 1 0  Down, Depressed, Hopeless 1 1 0  PHQ - 2 Score 2 2 0  Altered sleeping 2 2 1   Tired, decreased energy 2 2 1   Change in appetite 0 2 1  Feeling bad or failure about yourself  0 1 0  Trouble concentrating 3 2 1   Moving slowly or fidgety/restless 0 0 0  Suicidal thoughts 0 0 0  PHQ-9 Score 9 11 4   Difficult doing work/chores Not difficult at all Somewhat difficult        04/22/2024    8:24 AM 03/12/2024  8:21 AM 11/27/2023    1:05 PM 09/12/2023   10:16 AM  GAD 7 : Generalized Anxiety Score  Nervous, Anxious, on Edge 2 2 0 2  Control/stop worrying 1 2 1 1   Worry too much - different things 1 2 1 1   Trouble relaxing 1 2 0 1  Restless 1 2 1 1   Easily annoyed or irritable 2 2 1 2   Afraid - awful might happen 0 2 1 0  Total GAD 7 Score 8 14 5 8   Anxiety Difficulty Not difficult at all Somewhat difficult  Somewhat difficult     Review of Systems  Constitutional:  Positive for diaphoresis.  Neurological:  Positive for dizziness, tingling and sensory change. Negative for speech change, focal weakness, seizures, weakness and  headaches.  Psychiatric/Behavioral:  The patient is nervous/anxious.     Negative unless indicated in HPI   Objective:     BP 131/78 (BP Location: Left Arm, Patient Position: Sitting, Cuff Size: Normal)   Pulse 68   Temp 97.8 F (36.6 C) (Oral)   Ht 5' 10 (1.778 m)   Wt 247 lb 6.4 oz (112.2 kg)   LMP 03/28/2023 (Exact Date)   SpO2 97%   BMI 35.50 kg/m    Physical Exam Constitutional:      General: She is not in acute distress.    Appearance: Normal appearance. She is obese. She is not toxic-appearing or diaphoretic.  HENT:     Head: Normocephalic.     Nose: Nose normal.     Mouth/Throat:     Mouth: Mucous membranes are moist.     Pharynx: Oropharynx is clear.  Eyes:     Extraocular Movements: Extraocular movements intact.     Pupils: Pupils are equal, round, and reactive to light.  Cardiovascular:     Rate and Rhythm: Normal rate and regular rhythm.     Pulses: Normal pulses.          Dorsalis pedis pulses are 2+ on the right side and 2+ on the left side.       Posterior tibial pulses are 2+ on the right side and 2+ on the left side.     Heart sounds: Normal heart sounds. No murmur heard.    No friction rub. No gallop.  Pulmonary:     Effort: No respiratory distress.     Breath sounds: No stridor. No wheezing, rhonchi or rales.  Chest:     Chest wall: No tenderness.  Musculoskeletal:     Right hand: No tenderness or bony tenderness. Normal range of motion. Normal strength. Normal sensation. Normal capillary refill. Normal pulse.     Left hand: No tenderness or bony tenderness. Normal range of motion. Normal strength. Normal sensation. Normal capillary refill. Normal pulse.     Right lower leg: No edema.     Left lower leg: No edema.     Right foot: Normal range of motion. No deformity, bunion, Charcot foot, foot drop or prominent metatarsal heads.     Left foot: Normal range of motion. No deformity, bunion, Charcot foot, foot drop or prominent metatarsal heads.      Comments: Finger tips has presence of tiny nodules subdermal. Diaphoresis present on hands.  Feet:     Right foot:     Protective Sensation: 10 sites tested.  7 sites sensed.     Skin integrity: Callus present.     Toenail Condition: Right toenails are normal.     Left foot:  Protective Sensation: 10 sites tested.  7 sites sensed.     Skin integrity: Callus present.     Toenail Condition: Left toenails are normal.  Skin:    General: Skin is warm and dry.     Capillary Refill: Capillary refill takes less than 2 seconds.  Neurological:     General: No focal deficit present.     Mental Status: She is alert and oriented to person, place, and time. Mental status is at baseline.  Psychiatric:        Mood and Affect: Mood normal.        Behavior: Behavior normal.        Thought Content: Thought content normal.        Judgment: Judgment normal.      No results found for any visits on 04/22/24.    The 10-year ASCVD risk score (Arnett DK, et al., 2019) is: 0.8%    Assessment & Plan:  History of apnea -     Semaglutide  (1 MG/DOSE); Inject 1 mg as directed once a week.  Dispense: 3 mL; Refill: 3  Class 2 obesity due to excess calories without serious comorbidity with body mass index (BMI) of 35.0 to 35.9 in adult -     Semaglutide  (1 MG/DOSE); Inject 1 mg as directed once a week.  Dispense: 3 mL; Refill: 3  Prediabetes -     Semaglutide  (1 MG/DOSE); Inject 1 mg as directed once a week.  Dispense: 3 mL; Refill: 3  Peripheral polyneuropathy -     Gabapentin ; Take 1 capsule (100 mg total) by mouth 3 (three) times daily.  Dispense: 270 capsule; Refill: 1  Perimenopausal vasomotor symptoms -     Gabapentin ; Take 1 capsule (100 mg total) by mouth 3 (three) times daily.  Dispense: 270 capsule; Refill: 1  Need for HPV vaccine -     HPV 9-valent vaccine,Recombinat     Assessment and Plan Assessment & Plan Weight management She lost 10 pounds on Wegovy  0.5 mg weekly with  increased bowel movements as the only side effect - she is happy with medication.  BMI down from 37.29 to 35.5 since May 2025.  - Increase Wegovy  to 1 mg weekly. - Monitor tolerance and side effects. - Reassess in one month at scheduled physical  Peripheral neuropathy Burning sensations in feet and hands, numbness, tingling, and blisters. Normal TSH, vitamin levels and A1c as of Mar 12, 2024. Symptoms consistent with neuropathy. Denies weakness in hands, arms, feet, legs. Full ROM and strengths 5/5 in all extremities.  Decreased sensations Bil. Feet and finger tips.  - Start gabapentin  100 mg, three times a day, starting with the first dose at night to monitor for sedation effects. - Consider neurology referral if symptoms persist. - Recommend gait analysis and proper footwear- [place] [employer].  Menopausal symptoms Post-hysterectomy night sweats and mood changes - procedure July 2024  Declines hormone therapy. Gabapentin  may help. - Start gabapentin  for night sweats and mood changes. - Discuss non-hormonal options if needed.  Panic disorder Managed with Celexa , Buspar , trazodone , and grounding techniques. Pt feels Dizziness attributed to panic disorder. - Continue Celexa , Buspar , and trazodone . - Encourage grounding techniques during panic attacks. - Reassess medication needs if symptoms change.  Thyroid  function monitoring Normal TSH, slightly low free T4, no symptoms. - Recheck thyroid  function at the next physical.  Prediabetes Controlled, last A1C normal  Pt would like HPV vaccine today Consent given, vaccine administered. Next dose in 4 weeks.  Return in about 4 weeks (around 05/20/2024) for annual physical.    Curtis DELENA Boom, FNP

## 2024-04-22 NOTE — Telephone Encounter (Signed)
 Apologies - sent in correct medication, Wegovy  1mg  weekly injection

## 2024-04-22 NOTE — Telephone Encounter (Unsigned)
 Copied from CRM [id number]. Topic: Clinical - Prescription Issue >> Apr 22, 2024 10:30 AM Fonda T wrote: Reason for CRM: Received call from patient, she was seen in office today, states incorrect prescription was sent to [facility] pharmacy. Per pharmacy received prescription for Ozempic, and patient insurance does Not cover Ozempic. Prescription should have been sent for Wegovy , as this is the medication insurance will cover per pharmacy.    Preferred pharmacy:  [facility] [address]), [place] - [address] [place]) [place] [address] Phone: [phone] Fax: [phone]  Patient can be reached at [phone], with any additional questions/concerns.   Thank you

## 2024-05-25 ENCOUNTER — Encounter: Payer: Self-pay | Admitting: Family Medicine

## 2024-05-25 ENCOUNTER — Ambulatory Visit: Admitting: Family Medicine

## 2024-05-25 VITALS — BP 124/84 | HR 90 | Wt 239.4 lb

## 2024-05-25 DIAGNOSIS — R051 Acute cough: Secondary | ICD-10-CM | POA: Diagnosis not present

## 2024-05-25 DIAGNOSIS — Z6835 Body mass index (BMI) 35.0-35.9, adult: Secondary | ICD-10-CM | POA: Diagnosis not present

## 2024-05-25 DIAGNOSIS — E66812 Obesity, class 2: Secondary | ICD-10-CM

## 2024-05-25 DIAGNOSIS — E6609 Other obesity due to excess calories: Secondary | ICD-10-CM

## 2024-05-25 MED ORDER — DEXTROMETHORPHAN-GUAIFENESIN 10-100 MG/5ML PO SYRP
5.0000 mL | ORAL_SOLUTION | Freq: Two times a day (BID) | ORAL | 0 refills | Status: DC
Start: 2024-05-25 — End: 2024-08-04

## 2024-05-25 MED ORDER — WEGOVY 1 MG/0.5ML ~~LOC~~ SOAJ: 1.0000 mg | SUBCUTANEOUS | 0 refills | Status: DC

## 2024-05-25 NOTE — Progress Notes (Signed)
 Established Patient Office Visit  Introduced to nurse practitioner role and practice setting.  All questions answered.  Discussed provider/patient relationship and expectations.  Subjective   Patient ID: Melanie Whitaker, female    DOB: 1986/05/22  Age: 38 y.o. MRN: [id number]  Chief Complaint  Patient presents with   Medical Management of Chronic Issues    Follow-up Obesity   Cough    Only cough for 1 week. Took some Tessalon  and it doesn't help and OTC Mucinex     Discussed the use of AI scribe software for clinical note transcription with the patient, who gave verbal consent to proceed.  History of Present Illness Melanie Whitaker is a 38 year old female who presents with a dry cough and dietary changes.  Cough and throat dryness - Persistent dry cough with associated throat dryness - Unable to achieve relief with increased fluid intake - No improvement with tessalon  or Mucinex  alone - Utilizes home remedies including honey with turmeric, ginger root, and cayenne pepper - Suspected viral exposure while caring for her mother, who had rhinovirus and pneumonia in hospital  Dietary changes and gastrointestinal symptoms - Initiated Wegovy , resulting in significant dietary changes - Developed strong aversion to meat, with associated nausea when consuming meat - Current diet includes fruits, vegetables, yogurt, Slim Jims, beef jerky, and baked chicken - Avoids fried foods and has reduced bread intake - Avoids sugary foods due to stomach discomfort  Weight loss and physical activity - Lost 8 pounds since last visit on April 22, 2024 - Goal weight is 170 pounds - Employment in housekeeping involves regular physical activity - Engages in swimming on weekends        04/22/2024    8:23 AM 03/12/2024    8:21 AM 11/27/2023    1:05 PM  Depression screen PHQ 2/9  Decreased Interest 1 1 0  Down, Depressed, Hopeless 1 1 0  PHQ - 2 Score 2 2 0  Altered sleeping 2 2 1   Tired, decreased  energy 2 2 1   Change in appetite 0 2 1  Feeling bad or failure about yourself  0 1 0  Trouble concentrating 3 2 1   Moving slowly or fidgety/restless 0 0 0  Suicidal thoughts 0 0 0  PHQ-9 Score 9 11 4   Difficult doing work/chores Not difficult at all Somewhat difficult        04/22/2024    8:24 AM 03/12/2024    8:21 AM 11/27/2023    1:05 PM 09/12/2023   10:16 AM  GAD 7 : Generalized Anxiety Score  Nervous, Anxious, on Edge 2 2 0 2  Control/stop worrying 1 2 1 1   Worry too much - different things 1 2 1 1   Trouble relaxing 1 2 0 1  Restless 1 2 1 1   Easily annoyed or irritable 2 2 1 2   Afraid - awful might happen 0 2 1 0  Total GAD 7 Score 8 14 5 8   Anxiety Difficulty Not difficult at all Somewhat difficult  Somewhat difficult    ROS  Negative unless indicated in HPI   Objective:     BP 124/84 (BP Location: Left Wrist, Patient Position: Sitting, Cuff Size: Normal)   Pulse 90   Wt 239 lb 6.4 oz (108.6 kg)   LMP 03/28/2023 (Exact Date)   SpO2 98%   BMI 34.35 kg/m    Physical Exam Constitutional:      General: She is not in acute distress.    Appearance: Normal  appearance. She is obese. She is not ill-appearing, toxic-appearing or diaphoretic.  HENT:     Head: Normocephalic.     Right Ear: Tympanic membrane, ear canal and external ear normal.     Left Ear: Tympanic membrane, ear canal and external ear normal.     Nose: Nose normal. No congestion or rhinorrhea.     Right Sinus: No maxillary sinus tenderness or frontal sinus tenderness.     Left Sinus: No maxillary sinus tenderness or frontal sinus tenderness.     Mouth/Throat:     Mouth: Mucous membranes are moist.     Tongue: No lesions. Tongue does not deviate from midline.     Palate: No mass and lesions.     Pharynx: Oropharynx is clear. Posterior oropharyngeal erythema present. No pharyngeal swelling, oropharyngeal exudate or postnasal drip.     Tonsils: No tonsillar exudate or tonsillar abscesses.  Eyes:      Extraocular Movements: Extraocular movements intact.     Pupils: Pupils are equal, round, and reactive to light.  Cardiovascular:     Rate and Rhythm: Normal rate and regular rhythm.     Pulses: Normal pulses.     Heart sounds: Normal heart sounds. No murmur heard.    No friction rub. No gallop.  Pulmonary:     Effort: No respiratory distress.     Breath sounds: No stridor. No wheezing, rhonchi or rales.  Chest:     Chest wall: No tenderness.  Musculoskeletal:     Right lower leg: No edema.     Left lower leg: No edema.  Skin:    General: Skin is warm and dry.     Capillary Refill: Capillary refill takes less than 2 seconds.  Neurological:     General: No focal deficit present.     Mental Status: She is alert and oriented to person, place, and time. Mental status is at baseline.  Psychiatric:        Mood and Affect: Mood normal.        Behavior: Behavior normal.        Thought Content: Thought content normal.        Judgment: Judgment normal.      No results found for any visits on 05/25/24.    The 10-year ASCVD risk score (Arnett DK, et al., 2019) is: 0.7%    Assessment & Plan:  Acute cough -     Dextromethorphan -guaiFENesin ; Take 5 mLs by mouth every 12 (twelve) hours.  Dispense: 118 mL; Refill: 0  Class 2 obesity due to excess calories without serious comorbidity with body mass index (BMI) of 35.0 to 35.9 in adult -     Wegovy ; Inject 1 mg into the skin once a week. Use this dose for 1 month (4 shots) and then increase to next higher dose.  Dispense: 2 mL; Refill: 0     Assessment and Plan Assessment & Plan Obesity with appetite changes and food aversions on Wegovy  Obesity with recent weight loss from 247 lbs to 239 lbs. Appetite changes and food aversions since starting Wegovy . Tolerates chicken, yogurt, and some protein sources. No desire for fried foods or bread. Lifestyle changes include not eating after dark and reducing bread intake. Goal weight is 170 lbs.  No current exercise routine due to work demands, but engages in physical activity through work and swimming on weekends. - Increase Wegovy  to 1mg  weekly - Pt to reach out in one month for tolerability, if no adverse effects can increase  to next dose. - Encourage monitoring protein intake through yogurt, protein shakes, and other tolerated sources. - Advise to continue lifestyle changes and dietary modifications. - Encourage physical activity when possible, such as 30-minute walks or swimming. - Will check BMP, lipids at next vist  Viral upper respiratory infection with dry cough Symptoms include dry cough, dry throat, and feeling of dehydration. No signs of bacterial infection; likely post-viral cough. Recent exposure to hospital environment with terminally ill mother who had rhinovirus and pneumonia. - Prescribe cough syrup to manage symptoms. - Continue Mucinex  to help loosen secretions. - Advise symptomatic management with hot tea and honey, and home remedies like honey with turmeric, ginger root, and cayenne pepper. - Robitussin DM syrup ordered - May use flonase for post nasal drip  General Health Maintenance - Send medications to [facility] on [address].   Return in about 2 months (around 07/25/2024) for weight mgmt.   I, Curtis DELENA Boom, FNP, have reviewed all documentation for this visit. The documentation on 05/25/24 for the exam, diagnosis, procedures, and orders are all accurate and complete.   Curtis DELENA Boom, FNP

## 2024-05-28 ENCOUNTER — Encounter: Payer: Medicaid Other | Admitting: Family Medicine

## 2024-06-29 ENCOUNTER — Other Ambulatory Visit: Payer: Self-pay | Admitting: Family Medicine

## 2024-06-29 ENCOUNTER — Encounter: Payer: Self-pay | Admitting: Family Medicine

## 2024-06-29 DIAGNOSIS — E6609 Other obesity due to excess calories: Secondary | ICD-10-CM

## 2024-06-29 MED ORDER — SEMAGLUTIDE-WEIGHT MANAGEMENT 1.7 MG/0.75ML ~~LOC~~ SOAJ: 1.7000 mg | SUBCUTANEOUS | 1 refills | Status: DC

## 2024-06-30 NOTE — Telephone Encounter (Signed)
 Left message to call back to schedule and have sent a mychart message.

## 2024-07-14 ENCOUNTER — Other Ambulatory Visit: Payer: Self-pay

## 2024-07-14 ENCOUNTER — Emergency Department
Admission: EM | Admit: 2024-07-14 | Discharge: 2024-07-14 | Disposition: A | Discharge status: Home / Self Care | Attending: Emergency Medicine | Admitting: Emergency Medicine

## 2024-07-14 DIAGNOSIS — R7981 Abnormal blood-gas level: Secondary | ICD-10-CM | POA: Diagnosis not present

## 2024-07-14 DIAGNOSIS — A084 Viral intestinal infection, unspecified: Secondary | ICD-10-CM | POA: Diagnosis not present

## 2024-07-14 DIAGNOSIS — R197 Diarrhea, unspecified: Secondary | ICD-10-CM | POA: Diagnosis present

## 2024-07-14 LAB — COMPREHENSIVE METABOLIC PANEL WITH GFR
ALT: 44 U/L (ref 0–44)
AST: 28 U/L (ref 15–41)
Albumin: 4.2 g/dL (ref 3.5–5.0)
Alkaline Phosphatase: 83 U/L (ref 38–126)
Anion gap: 11 (ref 5–15)
BUN: 12 mg/dL (ref 6–20)
CO2: 21 mmol/L — ABNORMAL LOW (ref 22–32)
Calcium: 9.4 mg/dL (ref 8.9–10.3)
Chloride: 105 mmol/L (ref 98–111)
Creatinine, Ser: 0.79 mg/dL (ref 0.44–1.00)
GFR, Estimated: >60 mL/min (ref >60)
Glucose, Bld: 86 mg/dL (ref 70–99)
Potassium: 3.7 mmol/L (ref 3.5–5.1)
Sodium: 137 mmol/L (ref 135–145)
Total Bilirubin: 1.2 mg/dL (ref 0.0–1.2)
Total Protein: 8.2 g/dL — ABNORMAL HIGH (ref 6.5–8.1)

## 2024-07-14 LAB — CBC
HCT: 42.5 % (ref 36.0–46.0)
Hemoglobin: 13.8 g/dL (ref 12.0–15.0)
MCH: 29 pg (ref 26.0–34.0)
MCHC: 32.5 g/dL (ref 30.0–36.0)
MCV: 89.3 fL (ref 80.0–100.0)
Platelets: 291 K/uL (ref 150–400)
RBC: 4.76 MIL/uL (ref 3.87–5.11)
RDW: 13.3 % (ref 11.5–15.5)
WBC: 8.6 K/uL (ref 4.0–10.5)
nRBC: 0 % (ref 0.0–0.2)

## 2024-07-14 LAB — URINALYSIS, ROUTINE W REFLEX MICROSCOPIC
Bilirubin Urine: NEGATIVE
Glucose, UA: NEGATIVE mg/dL
Hgb urine dipstick: NEGATIVE
Ketones, ur: NEGATIVE mg/dL
Leukocytes,Ua: NEGATIVE
Nitrite: NEGATIVE
Protein, ur: NEGATIVE mg/dL
Specific Gravity, Urine: 1.029 (ref 1.005–1.030)
pH: 5 (ref 5.0–8.0)

## 2024-07-14 LAB — RESP PANEL BY RT-PCR (RSV, FLU A&B, COVID)  RVPGX2
Influenza A by PCR: NEGATIVE
Influenza B by PCR: NEGATIVE
Resp Syncytial Virus by PCR: NEGATIVE
SARS Coronavirus 2 by RT PCR: NEGATIVE

## 2024-07-14 LAB — LIPASE, BLOOD: Lipase: 39 U/L (ref 11–51)

## 2024-07-14 MED ORDER — PROCHLORPERAZINE MALEATE 5 MG PO TABS: 5.0000 mg | ORAL_TABLET | Freq: Four times a day (QID) | ORAL | 0 refills | Status: AC | PRN

## 2024-07-14 MED ORDER — KETOROLAC TROMETHAMINE 15 MG/ML IJ SOLN
15.0000 mg | Freq: Once | INTRAMUSCULAR | Status: AC
  Administered 2024-07-14: 15 mg via INTRAVENOUS
  Filled 2024-07-14: qty 1

## 2024-07-14 MED ORDER — SODIUM CHLORIDE 0.9 % IV BOLUS
1000.0000 mL | Freq: Once | INTRAVENOUS | Status: AC
  Administered 2024-07-14: 1000 mL via INTRAVENOUS

## 2024-07-14 MED ORDER — METOCLOPRAMIDE HCL 5 MG/ML IJ SOLN
10.0000 mg | Freq: Once | INTRAMUSCULAR | Status: AC
  Administered 2024-07-14: 10 mg via INTRAVENOUS
  Filled 2024-07-14: qty 2

## 2024-07-14 NOTE — ED Triage Notes (Signed)
 Kids have flu, patient arrives with C?O SOB, Fever, Diarrhea, vomiting x 2 days.  Last medicated at 0730 with tylenol  and Zofran  ODT.

## 2024-07-14 NOTE — Discharge Instructions (Addendum)
 You tested negative for flu, COVID and RSV.  You may still have the flu but are just not testing positive yet or this may be another viral infection.  Your blood work was normal today.  Your urinalysis did not show any signs of infection.  I have sent some medication to the pharmacy for you to take for nausea.  I encourage you to take small sips of water throughout the day and eat bland meals.  Return to the emergency department with any worsening symptoms.

## 2024-07-14 NOTE — ED Provider Notes (Signed)
 [facility] Provider Note    Event Date/Time   First MD Initiated Contact with Patient 07/14/24 1427     (approximate)   History   No chief complaint on file.   HPI  Melanie Whitaker is a 38 y.o. female with PMH of manic depression, anxiety, GERD, anemia presents for evaluation of flu symptoms including shortness of breath, fever, diarrhea, vomiting for 2 days.  Patient states that her kids have the flu.  She reports taking Zofran  to treat the nausea and vomiting but is still having these symptoms.      Physical Exam   Triage Vital Signs: ED Triage Vitals  Encounter Vitals Group     BP 07/14/24 1318 (!) 115/90     Girls Systolic BP Percentile --      Girls Diastolic BP Percentile --      Boys Systolic BP Percentile --      Boys Diastolic BP Percentile --      Pulse Rate 07/14/24 1318 (!) 109     Resp 07/14/24 1318 18     Temp 07/14/24 1318 98.2 F (36.8 C)     Temp Source 07/14/24 1318 Oral     SpO2 07/14/24 1318 98 %     Weight 07/14/24 1253 239 lb 6.7 oz (108.6 kg)     Height --      Head Circumference --      Peak Flow --      Pain Score 07/14/24 1252 8     Pain Loc --      Pain Education --      Exclude from Growth Chart --     Most recent vital signs: Vitals:   07/14/24 1521 07/14/24 1627  BP:  109/71  Pulse: 85 79  Resp:  17  Temp:  98 F (36.7 C)  SpO2: 98% 100%   General: Awake, no distress.  CV:  Good peripheral perfusion.  RRR. Resp:  Normal effort.  CTAB. Abd:  No distention.  Soft, tender to palpation of the left upper quadrant. Other:     ED Results / Procedures / Treatments   Labs (all labs ordered are listed, but only abnormal results are displayed) Labs Reviewed  COMPREHENSIVE METABOLIC PANEL WITH GFR - Abnormal; Notable for the following components:      Result Value   CO2 21 (*)    Total Protein 8.2 (*)    All other components within normal limits  URINALYSIS, ROUTINE W REFLEX MICROSCOPIC - Abnormal;  Notable for the following components:   Color, Urine AMBER (*)    APPearance HAZY (*)    All other components within normal limits  RESP PANEL BY RT-PCR (RSV, FLU A&B, COVID)  RVPGX2  CBC  LIPASE, BLOOD    PROCEDURES:  Critical Care performed: No  Procedures   MEDICATIONS ORDERED IN ED: Medications  sodium chloride  0.9 % bolus 1,000 mL (1,000 mLs Intravenous New Bag/Given 07/14/24 1540)  metoCLOPramide  (REGLAN ) injection 10 mg (10 mg Intravenous Given 07/14/24 1537)  ketorolac  (TORADOL ) 15 MG/ML injection 15 mg (15 mg Intravenous Given 07/14/24 1534)     IMPRESSION / MDM / ASSESSMENT AND PLAN / ED COURSE  I reviewed the triage vital signs and the nursing notes.                             38 year old female presents for evaluation of flulike symptoms.  Diastolic blood pressure and  heart rate elevated upon initial assessment.  Patient NAD on exam.  Differential diagnosis includes, but is not limited to, viral infection including flu, COVID, RSV, gastroenteritis, gastritis, peptic ulcer.  Patient's presentation is most consistent with acute complicated illness / injury requiring diagnostic workup.  Urinalysis is negative.  CBC within normal limits.  CMP shows slightly decreased CO2 and slightly increased protein.  Otherwise normal.  Will obtain respiratory panel to check for flu, COVID and RSV.  Will treat patient symptomatically with IV fluids, Reglan  and Toradol .  Suspect viral infection given patient's kids have the flu.  Will likely recommend symptomatic management.  Did consider imaging of the abdomen but given reassuring labs and vitals feel this would be low yield.  Patient is tender in the left upper quadrant over her stomach which is likely due to the recent vomiting.  Reviewed results with patient, will send a prescription for some nausea medication.  She is given a note for work.  She voiced understanding, all questions were answered and she was stable at  discharge.  Clinical Course as of 07/14/24 1709  Wed Jul 14, 2024  1659 Resp panel by RT-PCR (RSV, Flu A&B, Covid) Anterior Nasal Swab Negative. [LD]    Clinical Course User Index [LD] Cleaster Tinnie LABOR, PA-C     FINAL CLINICAL IMPRESSION(S) / ED DIAGNOSES   Final diagnoses:  Viral gastroenteritis     Rx / DC Orders   ED Discharge Orders          Ordered    prochlorperazine  (COMPAZINE ) 5 MG tablet  Every 6 hours PRN        07/14/24 1708             Note:  This document was prepared using Dragon voice recognition software and may include unintentional dictation errors.   Cleaster Tinnie LABOR, PA-C 07/14/24 1710    Levander Slate, MD 07/14/24 [phone]

## 2024-07-16 ENCOUNTER — Telehealth: Payer: Self-pay

## 2024-07-16 NOTE — Telephone Encounter (Signed)
 Copied from CRM [id number]. Topic: Clinical - Medication Question >> Jul 16, 2024  1:39 PM Gustabo D wrote: Pt says her insurance won't cover her wegovy  anymore and wants to talk with pcp about other choices.

## 2024-07-23 ENCOUNTER — Ambulatory Visit: Admitting: Family Medicine

## 2024-08-04 ENCOUNTER — Ambulatory Visit: Admitting: Family Medicine

## 2024-08-04 VITALS — BP 109/56 | HR 92 | Resp 14 | Ht 70.0 in | Wt 227.4 lb

## 2024-08-04 DIAGNOSIS — G8929 Other chronic pain: Secondary | ICD-10-CM

## 2024-08-04 DIAGNOSIS — Z6835 Body mass index (BMI) 35.0-35.9, adult: Secondary | ICD-10-CM | POA: Diagnosis not present

## 2024-08-04 DIAGNOSIS — R1013 Epigastric pain: Secondary | ICD-10-CM | POA: Diagnosis not present

## 2024-08-04 DIAGNOSIS — E66812 Obesity, class 2: Secondary | ICD-10-CM

## 2024-08-04 DIAGNOSIS — K219 Gastro-esophageal reflux disease without esophagitis: Secondary | ICD-10-CM

## 2024-08-04 DIAGNOSIS — R0681 Apnea, not elsewhere classified: Secondary | ICD-10-CM | POA: Diagnosis not present

## 2024-08-04 DIAGNOSIS — R110 Nausea: Secondary | ICD-10-CM

## 2024-08-04 DIAGNOSIS — E6609 Other obesity due to excess calories: Secondary | ICD-10-CM

## 2024-08-04 MED ORDER — BUPROPION HCL ER (XL) 150 MG PO TB24: 150.0000 mg | ORAL_TABLET | Freq: Every day | ORAL | 0 refills | Status: AC

## 2024-08-04 MED ORDER — NALTREXONE HCL 50 MG PO TABS: 25.0000 mg | ORAL_TABLET | Freq: Every day | ORAL | 0 refills | Status: DC

## 2024-08-04 NOTE — Progress Notes (Signed)
 Established Patient Office Visit  Subjective   Patient ID: Melanie Whitaker, female    DOB: November 12, 1985  Age: 38 y.o. MRN: [id number]  Chief Complaint  Patient presents with  . Follow-up    Medication follow up Wegovy - medicaid no longer covering    Discussed the use of AI scribe software for clinical note transcription with the patient, who gave verbal consent to proceed.  History of Present Illness Melanie Whitaker is a 38 year old female who presents with persistent abdominal pain and gastrointestinal symptoms.  She has been experiencing persistent abdominal pain and gastrointestinal symptoms for the past month to month and a half. The pain is described as excruciating and located in the stomach area, with no relief from current medications. She experiences 'sour stomach' characterized by burps that smell like rotten eggs, nausea, and occasional vomiting. These symptoms have persisted despite taking omeprazole  and other stomach medications.  Her medical history includes the removal of her gallbladder and appendix. She reports that she was told she had norovirus on two recent occasions when she went to the hospital. She has undergone multiple abdominal ultrasounds and a CT scan in the past, which showed mild ileus but no significant findings.  She is currently taking omeprazole  for her symptoms but reports it is not effective. She also takes probiotics regularly for gut health. No NSAID use, as she prefers natural pain management methods.  Her family history is significant for gastrointestinal issues; her father had irritable bowel syndrome that progressed to Crohn's disease, and her mother has pancreatic cancer.  She has recently started a new job, which has a 90-day probation period, making it difficult for her to attend medical appointments. Her work schedule varies, with some days ending at noon and others at 4 PM.     {History (Optional):23778}    08/04/2024    3:53 PM 04/22/2024     8:23 AM 03/12/2024    8:21 AM  Depression screen PHQ 2/9  Decreased Interest 1 1 1   Down, Depressed, Hopeless 1 1 1   PHQ - 2 Score 2 2 2   Altered sleeping 2 2 2   Tired, decreased energy 2 2 2   Change in appetite 0 0 2  Feeling bad or failure about yourself  0 0 1  Trouble concentrating 0 3 2  Moving slowly or fidgety/restless 0 0 0  Suicidal thoughts 0 0 0  PHQ-9 Score 6 9 11   Difficult doing work/chores Not difficult at all Not difficult at all Somewhat difficult       04/22/2024    8:24 AM 03/12/2024    8:21 AM 11/27/2023    1:05 PM 09/12/2023   10:16 AM  GAD 7 : Generalized Anxiety Score  Nervous, Anxious, on Edge 2 2 0 2  Control/stop worrying 1 2 1 1   Worry too much - different things 1 2 1 1   Trouble relaxing 1 2 0 1  Restless 1 2 1 1   Easily annoyed or irritable 2 2 1 2   Afraid - awful might happen 0 2 1 0  Total GAD 7 Score 8 14 5 8   Anxiety Difficulty Not difficult at all Somewhat difficult  Somewhat difficult     ROS  Negative unless indicated in HPI   Objective:     BP (!) 109/56   Pulse 92   Resp 14   Ht 5' 10 (1.778 m)   Wt 227 lb 6.4 oz (103.1 kg)   LMP 03/28/2023 (Exact Date)  SpO2 98%   BMI 32.63 kg/m  {Vitals History (Optional):23777}  Physical Exam   No results found for any visits on 08/04/24.  {Labs (Optional):23779}  The 10-year ASCVD risk score (Arnett DK, et al., 2019) is: 0.6%    Assessment & Plan:  There are no diagnoses linked to this encounter.   Assessment and Plan Assessment & Plan Chronic upper gastrointestinal symptoms (pain, nausea, reflux) Persistent upper gastrointestinal symptoms including sour stomach, nausea, and excruciating pain unrelieved by omeprazole . Differential diagnosis includes viral infection or other gastrointestinal disorders. Previous imaging showed mild ileus with no significant findings. Scheduled for gastroenterology consultation but concerned about work constraints. - Change omeprazole  to  pantoprazole  (Protonix ) to assess symptom improvement. - Encourage attending gastroenterology appointment for further evaluation, including potential endoscopy. - Offer to write a note for her employer to facilitate attending the specialist appointment.  Obesity Experiencing weight loss challenges. Previously used Wegovy  successfully but discontinued due to insurance issues. Discussed alternative pharmacological options including Wellbutrin and naltrexone for appetite suppression. Phentermine and Topamax were considered, but phentermine may exacerbate panic disorder and Topamax has a less favorable side effect profile. - Prescribe Wellbutrin 150 mg and naltrexone, starting with half a tablet of naltrexone for two weeks, then increasing to a full tablet if tolerated. - Consider phentermine as a secondary option if the current regimen is ineffective, with caution due to potential side effects on heart rate and anxiety. - Discuss potential side effects of naltrexone, including nausea, and advise discontinuation if not tolerated.      No follow-ups on file.    Curtis DELENA Boom, FNP

## 2024-08-05 ENCOUNTER — Encounter: Payer: Self-pay | Admitting: Family Medicine

## 2024-08-05 MED ORDER — PANTOPRAZOLE SODIUM 40 MG PO TBEC: 40.0000 mg | DELAYED_RELEASE_TABLET | Freq: Every day | ORAL | 3 refills | Status: DC

## 2024-08-09 ENCOUNTER — Inpatient Hospital Stay
Admission: EM | Admit: 2024-08-09 | Discharge: 2024-08-12 | DRG: 203 | Disposition: A | Discharge status: Home / Self Care | Attending: Internal Medicine | Admitting: Internal Medicine

## 2024-08-09 ENCOUNTER — Emergency Department

## 2024-08-09 ENCOUNTER — Other Ambulatory Visit: Payer: Self-pay

## 2024-08-09 ENCOUNTER — Encounter: Payer: Self-pay | Admitting: Emergency Medicine

## 2024-08-09 DIAGNOSIS — B9789 Other viral agents as the cause of diseases classified elsewhere: Secondary | ICD-10-CM | POA: Diagnosis present

## 2024-08-09 DIAGNOSIS — Z825 Family history of asthma and other chronic lower respiratory diseases: Secondary | ICD-10-CM

## 2024-08-09 DIAGNOSIS — Z8379 Family history of other diseases of the digestive system: Secondary | ICD-10-CM

## 2024-08-09 DIAGNOSIS — Z803 Family history of malignant neoplasm of breast: Secondary | ICD-10-CM

## 2024-08-09 DIAGNOSIS — Z79899 Other long term (current) drug therapy: Secondary | ICD-10-CM

## 2024-08-09 DIAGNOSIS — Z800 Family history of malignant neoplasm of digestive organs: Secondary | ICD-10-CM

## 2024-08-09 DIAGNOSIS — Z1152 Encounter for screening for COVID-19: Secondary | ICD-10-CM

## 2024-08-09 DIAGNOSIS — R059 Cough, unspecified: Secondary | ICD-10-CM | POA: Diagnosis not present

## 2024-08-09 DIAGNOSIS — R0602 Shortness of breath: Secondary | ICD-10-CM | POA: Diagnosis not present

## 2024-08-09 DIAGNOSIS — F419 Anxiety disorder, unspecified: Secondary | ICD-10-CM | POA: Diagnosis present

## 2024-08-09 DIAGNOSIS — Z8249 Family history of ischemic heart disease and other diseases of the circulatory system: Secondary | ICD-10-CM

## 2024-08-09 DIAGNOSIS — Z801 Family history of malignant neoplasm of trachea, bronchus and lung: Secondary | ICD-10-CM

## 2024-08-09 DIAGNOSIS — R062 Wheezing: Principal | ICD-10-CM

## 2024-08-09 DIAGNOSIS — F32A Depression, unspecified: Secondary | ICD-10-CM | POA: Diagnosis present

## 2024-08-09 DIAGNOSIS — K219 Gastro-esophageal reflux disease without esophagitis: Secondary | ICD-10-CM | POA: Diagnosis present

## 2024-08-09 DIAGNOSIS — Z8049 Family history of malignant neoplasm of other genital organs: Secondary | ICD-10-CM

## 2024-08-09 DIAGNOSIS — Z882 Allergy status to sulfonamides status: Secondary | ICD-10-CM

## 2024-08-09 DIAGNOSIS — Z888 Allergy status to other drugs, medicaments and biological substances status: Secondary | ICD-10-CM

## 2024-08-09 DIAGNOSIS — J209 Acute bronchitis, unspecified: Principal | ICD-10-CM | POA: Diagnosis present

## 2024-08-09 DIAGNOSIS — Z820 Family history of epilepsy and other diseases of the nervous system: Secondary | ICD-10-CM

## 2024-08-09 DIAGNOSIS — Z7985 Long-term (current) use of injectable non-insulin antidiabetic drugs: Secondary | ICD-10-CM

## 2024-08-09 DIAGNOSIS — Z823 Family history of stroke: Secondary | ICD-10-CM

## 2024-08-09 DIAGNOSIS — Z833 Family history of diabetes mellitus: Secondary | ICD-10-CM

## 2024-08-09 LAB — RESP PANEL BY RT-PCR (RSV, FLU A&B, COVID)  RVPGX2
Influenza A by PCR: NEGATIVE
Influenza B by PCR: NEGATIVE
Resp Syncytial Virus by PCR: NEGATIVE
SARS Coronavirus 2 by RT PCR: NEGATIVE

## 2024-08-09 LAB — GROUP A STREP BY PCR: Group A Strep by PCR: NOT DETECTED

## 2024-08-09 MED ORDER — IPRATROPIUM-ALBUTEROL 0.5-2.5 (3) MG/3ML IN SOLN
6.0000 mL | Freq: Once | RESPIRATORY_TRACT | Status: AC
  Administered 2024-08-09: 6 mL via RESPIRATORY_TRACT
  Filled 2024-08-09: qty 3

## 2024-08-09 MED ORDER — IPRATROPIUM-ALBUTEROL 0.5-2.5 (3) MG/3ML IN SOLN
3.0000 mL | Freq: Once | RESPIRATORY_TRACT | Status: AC
  Administered 2024-08-09: 3 mL via RESPIRATORY_TRACT
  Filled 2024-08-09: qty 3

## 2024-08-09 MED ORDER — PREDNISONE 20 MG PO TABS
60.0000 mg | ORAL_TABLET | Freq: Once | ORAL | Status: AC
  Administered 2024-08-09: 60 mg via ORAL
  Filled 2024-08-09: qty 3

## 2024-08-09 NOTE — ED Provider Notes (Signed)
 [facility] Emergency Department Provider Note     Event Date/Time   First MD Initiated Contact with Patient 08/09/24 2144     (approximate)   History   Cough, Shortness of Breath, and Generalized Body Aches   HPI  Melanie Whitaker is Whitaker 38 y.o. female presents to the ED with complaint of productive cough, wheezing, chest congestion and generalized body aches x 4 days.  Associated symptoms includes fevers and sore throat.  Patient endorses that her significant other has had similar symptoms.  Denies history of asthma or COPD. Patient endorses chest pressure as if an elephant is sitting on her chest.      Physical Exam   Triage Vital Signs: ED Triage Vitals  Encounter Vitals Group     BP 08/09/24 2038 (!) 120/97     Girls Systolic BP Percentile --      Girls Diastolic BP Percentile --      Boys Systolic BP Percentile --      Boys Diastolic BP Percentile --      Pulse Rate 08/09/24 2038 98     Resp 08/09/24 2038 18     Temp 08/09/24 2038 99.2 F (37.3 C)     Temp Source 08/09/24 2038 Oral     SpO2 08/09/24 2038 99 %     Weight --      Height --      Head Circumference --      Peak Flow --      Pain Score 08/09/24 2039 7     Pain Loc --      Pain Education --      Exclude from Growth Chart --     Most recent vital signs: Vitals:   08/09/24 2038  BP: (!) 120/97  Pulse: 98  Resp: 18  Temp: 99.2 F (37.3 C)  SpO2: 99%   General Awake, no distress.  Nontoxic-appearing HEENT NCAT.  CV:  Good peripheral perfusion. RRR RESP:  Normal effort.  Bilateral wheezing ABD:  No distention.  Other:   ED Results / Procedures / Treatments   Labs (all labs ordered are listed, but only abnormal results are displayed) Labs Reviewed  RESP PANEL BY RT-PCR (RSV, FLU Whitaker&B, COVID)  [id number]  GROUP Whitaker STREP BY PCR  CBC  COMPREHENSIVE METABOLIC PANEL WITH GFR  TROPONIN I (HIGH SENSITIVITY)    EKG  NSR  RADIOLOGY  I personally viewed and  evaluated these images as part of my medical decision making, as well as reviewing the written report by the radiologist.  ED Provider Interpretation: ***  DG Chest 2 View Result Date: 08/09/2024 EXAM: 2 VIEW(S) XRAY OF THE CHEST 08/09/2024 08:55:40 PM COMPARISON: 9 / 18 / 23 CLINICAL HISTORY: cough/sob. Cough, shortness of breath, generalized body aches FINDINGS: LUNGS AND PLEURA: No focal pulmonary opacity. No pulmonary edema. No pleural effusion. No pneumothorax. HEART AND MEDIASTINUM: No acute abnormality of the cardiac and mediastinal silhouettes. BONES AND SOFT TISSUES: No acute osseous abnormality. IMPRESSION: 1. Normal chest radiograph. No acute cardiopulmonary process. Electronically signed by: Norman Gatlin MD 08/09/2024 08:59 PM EDT RP Workstation: [id number]    PROCEDURES:  Critical Care performed: No  Procedures   MEDICATIONS ORDERED IN ED: Medications  ipratropium-albuterol  (DUONEB) 0.5-2.5 (3) MG/3ML nebulizer solution 3 mL (3 mLs Nebulization Given 08/09/24 2229)  predniSONE  (DELTASONE ) tablet 60 mg (60 mg Oral Given 08/09/24 2228)  ipratropium-albuterol  (DUONEB) 0.5-2.5 (3) MG/3ML nebulizer solution 3 mL (3 mLs Nebulization Given 08/09/24  2312)  ipratropium-albuterol  (DUONEB) 0.5-2.5 (3) MG/3ML nebulizer solution 6 mL (6 mLs Nebulization Given 08/09/24 2338)     IMPRESSION / MDM / ASSESSMENT AND PLAN / ED COURSE  I reviewed the triage vital signs and the nursing notes.                              Clinical Course as of 08/09/24 2352  Mon Aug 09, 2024  2259 DG Chest 2 View MPRESSION: 1. Normal chest radiograph. No acute cardiopulmonary process.   [MH]    Clinical Course User Index [MH] Melanie Monte A, PA-C    38 y.o. female presents to the emergency department for evaluation and treatment of wheezing. See HPI for further details.   Differential diagnosis includes, but is not limited to viral URI, strep pharyngitis, PNA, bronchitis, bronchospasm, asthma,  ACS, Acute MI less likely  Patient's presentation is most consistent with acute presentation with potential threat to life or bodily function.    The patient is in stable and satisfactory condition for discharge home. Encouraged to follow up with *** for further management. ED precautions discussed. All questions and concerns were addressed during this ED visit.     FINAL CLINICAL IMPRESSION(S) / ED DIAGNOSES   Final diagnoses:  None     Rx / DC Orders   ED Discharge Orders     None        Note:  This document was prepared using Dragon voice recognition software and may include unintentional dictation errors.

## 2024-08-09 NOTE — ED Triage Notes (Addendum)
 Pt arrives POV, ambulatory to triage, gait steady, no acute distress noted c/o chest congestion/ wheezing, sob, generalized body aches, sore throat, productive cough w/ yellow/green sputum since Thursday. Pt taking dayquil without relief. No audible wheezing noted.

## 2024-08-10 DIAGNOSIS — Z800 Family history of malignant neoplasm of digestive organs: Secondary | ICD-10-CM | POA: Diagnosis not present

## 2024-08-10 DIAGNOSIS — K219 Gastro-esophageal reflux disease without esophagitis: Secondary | ICD-10-CM | POA: Diagnosis not present

## 2024-08-10 DIAGNOSIS — Z820 Family history of epilepsy and other diseases of the nervous system: Secondary | ICD-10-CM | POA: Diagnosis not present

## 2024-08-10 DIAGNOSIS — Z833 Family history of diabetes mellitus: Secondary | ICD-10-CM | POA: Diagnosis not present

## 2024-08-10 DIAGNOSIS — Z1152 Encounter for screening for COVID-19: Secondary | ICD-10-CM | POA: Diagnosis not present

## 2024-08-10 DIAGNOSIS — Z8379 Family history of other diseases of the digestive system: Secondary | ICD-10-CM | POA: Diagnosis not present

## 2024-08-10 DIAGNOSIS — F32A Depression, unspecified: Secondary | ICD-10-CM | POA: Diagnosis not present

## 2024-08-10 DIAGNOSIS — Z825 Family history of asthma and other chronic lower respiratory diseases: Secondary | ICD-10-CM | POA: Diagnosis not present

## 2024-08-10 DIAGNOSIS — Z7985 Long-term (current) use of injectable non-insulin antidiabetic drugs: Secondary | ICD-10-CM | POA: Diagnosis not present

## 2024-08-10 DIAGNOSIS — Z8249 Family history of ischemic heart disease and other diseases of the circulatory system: Secondary | ICD-10-CM | POA: Diagnosis not present

## 2024-08-10 DIAGNOSIS — J209 Acute bronchitis, unspecified: Secondary | ICD-10-CM | POA: Diagnosis present

## 2024-08-10 DIAGNOSIS — F419 Anxiety disorder, unspecified: Secondary | ICD-10-CM | POA: Diagnosis not present

## 2024-08-10 DIAGNOSIS — Z882 Allergy status to sulfonamides status: Secondary | ICD-10-CM | POA: Diagnosis not present

## 2024-08-10 DIAGNOSIS — B9789 Other viral agents as the cause of diseases classified elsewhere: Secondary | ICD-10-CM | POA: Diagnosis not present

## 2024-08-10 DIAGNOSIS — Z823 Family history of stroke: Secondary | ICD-10-CM | POA: Diagnosis not present

## 2024-08-10 DIAGNOSIS — Z801 Family history of malignant neoplasm of trachea, bronchus and lung: Secondary | ICD-10-CM | POA: Diagnosis not present

## 2024-08-10 DIAGNOSIS — R0602 Shortness of breath: Secondary | ICD-10-CM | POA: Diagnosis not present

## 2024-08-10 DIAGNOSIS — Z803 Family history of malignant neoplasm of breast: Secondary | ICD-10-CM | POA: Diagnosis not present

## 2024-08-10 DIAGNOSIS — R062 Wheezing: Secondary | ICD-10-CM | POA: Diagnosis not present

## 2024-08-10 DIAGNOSIS — Z888 Allergy status to other drugs, medicaments and biological substances status: Secondary | ICD-10-CM | POA: Diagnosis not present

## 2024-08-10 DIAGNOSIS — Z8049 Family history of malignant neoplasm of other genital organs: Secondary | ICD-10-CM | POA: Diagnosis not present

## 2024-08-10 DIAGNOSIS — Z79899 Other long term (current) drug therapy: Secondary | ICD-10-CM | POA: Diagnosis not present

## 2024-08-10 LAB — CBC
HCT: 37.9 % (ref 36.0–46.0)
HCT: 39.1 % (ref 36.0–46.0)
Hemoglobin: 12.7 g/dL (ref 12.0–15.0)
Hemoglobin: 12.7 g/dL (ref 12.0–15.0)
MCH: 29.3 pg (ref 26.0–34.0)
MCH: 29.9 pg (ref 26.0–34.0)
MCHC: 32.5 g/dL (ref 30.0–36.0)
MCHC: 33.5 g/dL (ref 30.0–36.0)
MCV: 89.2 fL (ref 80.0–100.0)
MCV: 90.1 fL (ref 80.0–100.0)
Platelets: 261 K/uL (ref 150–400)
Platelets: 268 K/uL (ref 150–400)
RBC: 4.25 MIL/uL (ref 3.87–5.11)
RBC: 4.34 MIL/uL (ref 3.87–5.11)
RDW: 13.3 % (ref 11.5–15.5)
RDW: 13.5 % (ref 11.5–15.5)
WBC: 7.2 K/uL (ref 4.0–10.5)
WBC: 8.6 K/uL (ref 4.0–10.5)
nRBC: 0 % (ref 0.0–0.2)
nRBC: 0 % (ref 0.0–0.2)

## 2024-08-10 LAB — COMPREHENSIVE METABOLIC PANEL WITH GFR
ALT: 34 U/L (ref 0–44)
AST: 21 U/L (ref 15–41)
Albumin: 3.7 g/dL (ref 3.5–5.0)
Alkaline Phosphatase: 72 U/L (ref 38–126)
Anion gap: 11 (ref 5–15)
BUN: 10 mg/dL (ref 6–20)
CO2: 23 mmol/L (ref 22–32)
Calcium: 8.9 mg/dL (ref 8.9–10.3)
Chloride: 103 mmol/L (ref 98–111)
Creatinine, Ser: 0.73 mg/dL (ref 0.44–1.00)
GFR, Estimated: >60 mL/min (ref >60)
Glucose, Bld: 90 mg/dL (ref 70–99)
Potassium: 4 mmol/L (ref 3.5–5.1)
Sodium: 137 mmol/L (ref 135–145)
Total Bilirubin: 0.6 mg/dL (ref 0.0–1.2)
Total Protein: 7.3 g/dL (ref 6.5–8.1)

## 2024-08-10 LAB — BLOOD GAS, VENOUS
Acid-base deficit: 0.7 mmol/L (ref 0.0–2.0)
Bicarbonate: 24.2 mmol/L (ref 20.0–28.0)
O2 Saturation: 87.8 %
Patient temperature: 37
pCO2, Ven: 40 mmHg — ABNORMAL LOW (ref 44–60)
pH, Ven: 7.39 (ref 7.25–7.43)
pO2, Ven: 54 mmHg — ABNORMAL HIGH (ref 32–45)

## 2024-08-10 LAB — BASIC METABOLIC PANEL WITH GFR
Anion gap: 13 (ref 5–15)
BUN: 8 mg/dL (ref 6–20)
CO2: 21 mmol/L — ABNORMAL LOW (ref 22–32)
Calcium: 9 mg/dL (ref 8.9–10.3)
Chloride: 104 mmol/L (ref 98–111)
Creatinine, Ser: 0.61 mg/dL (ref 0.44–1.00)
GFR, Estimated: >60 mL/min (ref >60)
Glucose, Bld: 138 mg/dL — ABNORMAL HIGH (ref 70–99)
Potassium: 4.4 mmol/L (ref 3.5–5.1)
Sodium: 138 mmol/L (ref 135–145)

## 2024-08-10 LAB — RESPIRATORY PANEL BY PCR

## 2024-08-10 LAB — HIV ANTIBODY (ROUTINE TESTING W REFLEX): HIV Screen 4th Generation wRfx: NONREACTIVE

## 2024-08-10 LAB — TROPONIN I (HIGH SENSITIVITY)
Troponin I (High Sensitivity): <2 ng/L (ref <18)
Troponin I (High Sensitivity): <2 ng/L (ref <18)

## 2024-08-10 MED ORDER — BUPROPION HCL ER (XL) 150 MG PO TB24
150.0000 mg | ORAL_TABLET | Freq: Every day | ORAL | Status: DC
  Administered 2024-08-10 – 2024-08-12 (×3): 150 mg via ORAL
  Filled 2024-08-10 (×3): qty 1

## 2024-08-10 MED ORDER — ALBUTEROL SULFATE (2.5 MG/3ML) 0.083% IN NEBU
2.5000 mg | INHALATION_SOLUTION | Freq: Once | RESPIRATORY_TRACT | Status: AC
  Administered 2024-08-10: 2.5 mg via RESPIRATORY_TRACT
  Filled 2024-08-10: qty 3

## 2024-08-10 MED ORDER — ONDANSETRON HCL 4 MG PO TABS: 4.0000 mg | ORAL_TABLET | Freq: Four times a day (QID) | ORAL | Status: DC | PRN

## 2024-08-10 MED ORDER — PANTOPRAZOLE SODIUM 40 MG PO TBEC
40.0000 mg | DELAYED_RELEASE_TABLET | Freq: Every day | ORAL | Status: DC
  Administered 2024-08-10 – 2024-08-12 (×3): 40 mg via ORAL
  Filled 2024-08-10 (×3): qty 1

## 2024-08-10 MED ORDER — TRAZODONE HCL 50 MG PO TABS
25.0000 mg | ORAL_TABLET | Freq: Every evening | ORAL | Status: DC | PRN
  Administered 2024-08-10 – 2024-08-11 (×2): 25 mg via ORAL
  Filled 2024-08-10 (×2): qty 1

## 2024-08-10 MED ORDER — ORAL CARE MOUTH RINSE: 15.0000 mL | OROMUCOSAL | Status: DC | PRN

## 2024-08-10 MED ORDER — NALTREXONE HCL 50 MG PO TABS
25.0000 mg | ORAL_TABLET | Freq: Every day | ORAL | Status: DC
  Administered 2024-08-10 – 2024-08-12 (×3): 25 mg via ORAL
  Filled 2024-08-10 (×3): qty 1

## 2024-08-10 MED ORDER — CITALOPRAM HYDROBROMIDE 20 MG PO TABS
30.0000 mg | ORAL_TABLET | Freq: Every day | ORAL | Status: DC
  Administered 2024-08-10 – 2024-08-12 (×3): 30 mg via ORAL
  Filled 2024-08-10 (×3): qty 2

## 2024-08-10 MED ORDER — ONDANSETRON HCL 4 MG/2ML IJ SOLN: 4.0000 mg | Freq: Four times a day (QID) | INTRAMUSCULAR | Status: DC | PRN

## 2024-08-10 MED ORDER — ENOXAPARIN SODIUM 60 MG/0.6ML IJ SOSY
50.0000 mg | PREFILLED_SYRINGE | INTRAMUSCULAR | Status: DC
  Administered 2024-08-10 – 2024-08-12 (×3): 50 mg via SUBCUTANEOUS
  Filled 2024-08-10 (×3): qty 0.6

## 2024-08-10 MED ORDER — CITALOPRAM HYDROBROMIDE 20 MG PO TABS: 20.0000 mg | ORAL_TABLET | Freq: Every day | ORAL | Status: DC

## 2024-08-10 MED ORDER — SODIUM CHLORIDE 0.9 % IV SOLN
INTRAVENOUS | Status: DC
  Administered 2024-08-10: 200 mL via INTRAVENOUS

## 2024-08-10 MED ORDER — MAGNESIUM HYDROXIDE 400 MG/5ML PO SUSP: 30.0000 mL | Freq: Every day | ORAL | Status: DC | PRN

## 2024-08-10 MED ORDER — ACETAMINOPHEN 650 MG RE SUPP: 650.0000 mg | Freq: Four times a day (QID) | RECTAL | Status: DC | PRN

## 2024-08-10 MED ORDER — MAGNESIUM SULFATE 2 GM/50ML IV SOLN
2.0000 g | Freq: Once | INTRAVENOUS | Status: AC
  Administered 2024-08-10: 2 g via INTRAVENOUS
  Filled 2024-08-10: qty 50

## 2024-08-10 MED ORDER — DM-GUAIFENESIN ER 30-600 MG PO TB12
1.0000 | ORAL_TABLET | Freq: Two times a day (BID) | ORAL | Status: DC
  Administered 2024-08-10 – 2024-08-12 (×5): 1 via ORAL
  Filled 2024-08-10 (×5): qty 1

## 2024-08-10 MED ORDER — HYDROCOD POLI-CHLORPHE POLI ER 10-8 MG/5ML PO SUER
5.0000 mL | Freq: Two times a day (BID) | ORAL | Status: DC | PRN
  Administered 2024-08-10 – 2024-08-12 (×4): 5 mL via ORAL
  Filled 2024-08-10 (×4): qty 5

## 2024-08-10 MED ORDER — PROCHLORPERAZINE MALEATE 5 MG PO TABS: 5.0000 mg | ORAL_TABLET | Freq: Four times a day (QID) | ORAL | Status: DC | PRN

## 2024-08-10 MED ORDER — ACETAMINOPHEN 325 MG PO TABS: 650.0000 mg | ORAL_TABLET | Freq: Four times a day (QID) | ORAL | Status: DC | PRN

## 2024-08-10 MED ORDER — IPRATROPIUM BROMIDE 0.02 % IN SOLN
0.5000 mg | RESPIRATORY_TRACT | Status: DC | PRN
  Administered 2024-08-10: 0.5 mg via RESPIRATORY_TRACT
  Filled 2024-08-10: qty 2.5

## 2024-08-10 MED ORDER — GABAPENTIN 100 MG PO CAPS
100.0000 mg | ORAL_CAPSULE | Freq: Three times a day (TID) | ORAL | Status: DC
  Administered 2024-08-10 – 2024-08-12 (×7): 100 mg via ORAL
  Filled 2024-08-10 (×7): qty 1

## 2024-08-10 MED ORDER — SEMAGLUTIDE-WEIGHT MANAGEMENT 1.7 MG/0.75ML ~~LOC~~ SOAJ: 1.7000 mg | SUBCUTANEOUS | Status: DC

## 2024-08-10 MED ORDER — IPRATROPIUM-ALBUTEROL 0.5-2.5 (3) MG/3ML IN SOLN
3.0000 mL | Freq: Four times a day (QID) | RESPIRATORY_TRACT | Status: DC
  Administered 2024-08-10 (×3): 3 mL via RESPIRATORY_TRACT
  Filled 2024-08-10 (×3): qty 3

## 2024-08-10 MED ORDER — METHYLPREDNISOLONE SODIUM SUCC 40 MG IJ SOLR
40.0000 mg | Freq: Two times a day (BID) | INTRAMUSCULAR | Status: DC
  Administered 2024-08-10 – 2024-08-12 (×5): 40 mg via INTRAVENOUS
  Filled 2024-08-10 (×5): qty 1

## 2024-08-10 NOTE — Progress Notes (Signed)
 Progress Note   Patient: Melanie Whitaker FMW:[id number] DOB: 1986/06/05 DOA: 08/09/2024     0 DOS: the patient was seen and examined on 08/10/2024   Brief hospital course: TYLAR MERENDINO is a 38 y.o. Caucasian female with medical history significant for anxiety, depression, GERD, who presented to the emergency room with acute onset of worsening productive cough with associated chest congestion and generalized bodyaches over the last 4 days.  She admitted to fever and sore throat.  No nausea or vomiting or abdominal pain.  She admitted to chest pressure with her cough described as heaviness as if an elephant is sitting on her chest.  She is a non-smoker and has no history of COPD or asthma.  She denied any abdominal pain or melena or bright red blood per rectum.  No dysuria, oliguria or hematuria or flank pain.   ED Course: When she came to the ER, temperature was 99.2 and BP 120/97 with otherwise normal vital signs.  Labs revealed unremarkable CMP and CBC.  Respiratory panel came back negative.  Pharyngeal Group A strep came back negative EKG as reviewed by me :  EKG showed normal sinus rhythm with rate of 85 with right axis deviation. Imaging: 2 view chest x-ray was normal.   The patient was given 3 DuoNebs, nebulized albuterol  2.5 mcg, 2 g IV magnesium sulfate and 60 mg of p.o. prednisone .  She will be admitted to a medical telemetry observation bed for further evaluation and management.  Assessment and Plan: * Acute bronchitis with bronchospasm 2/2 Rhinovirus  Husband with viral URTI.  Will continue with ipratropium nebs, patient feels albuterol  making her mildly anxioius  Continue mucolytic therapy  Continue steroid therapy with IV Solu-Medrol . - O2 protocol will be followed.  GERD without esophagitis - Will continue PPI therapy   Anxiety and depression - Will continue bupropion, Celexa        Subjective: continues to have some sob and wheezing. Feels this is mildly improved from  when she was admitted.   Physical Exam: Vitals:   08/10/24 0106 08/10/24 0157 08/10/24 0809 08/10/24 1442  BP: 117/71 123/70 107/70 108/60  Pulse: 76 85 74 89  Resp: 13 18  17   Temp: (!) 97.4 F (36.3 C) 97.6 F (36.4 C) 97.6 F (36.4 C) 97.6 F (36.4 C)  TempSrc: Oral Oral Oral Oral  SpO2: 96% 96% 95% 95%   Physical Exam  Constitutional: In no distress.  Cardiovascular: Normal rate, regular rhythm. No lower extremity edema  Pulmonary: Non labored breathing on room air, Diffuse wheezing, no rales  Abdominal: Soft. Non distended and non tender Musculoskeletal: Normal range of motion.     Neurological: Alert and oriented to person, place, and time. Non focal  Skin: Skin is warm and dry.   Data Reviewed:     Latest Ref Rng & Units 08/10/2024    4:48 AM 08/09/2024   11:36 PM 07/14/2024    1:13 PM  CBC  WBC 4.0 - 10.5 K/uL 7.2  8.6  8.6   Hemoglobin 12.0 - 15.0 g/dL 87.2  87.2  86.1   Hematocrit 36.0 - 46.0 % 39.1  37.9  42.5   Platelets 150 - 400 K/uL 268  261  291       Latest Ref Rng & Units 08/10/2024    4:48 AM 08/09/2024   11:36 PM 07/14/2024    1:13 PM  BMP  Glucose 70 - 99 mg/dL 861  90  86   BUN 6 -  20 mg/dL 8  10  12    Creatinine 0.44 - 1.00 mg/dL 9.38  9.26  9.20   Sodium 135 - 145 mmol/L 138  137  137   Potassium 3.5 - 5.1 mmol/L 4.4  4.0  3.7   Chloride 98 - 111 mmol/L 104  103  105   CO2 22 - 32 mmol/L 21  23  21    Calcium 8.9 - 10.3 mg/dL 9.0  8.9  9.4      Family Communication: None  Disposition: Status is: Inpatient Remains inpatient appropriate because: futher treatment of acute bronchitis, remains signficantly symptomatic   Planned Discharge Destination: Home    Time spent: 20 minutes  Author: Alban Pepper, MD 08/10/2024 5:28 PM  For on call review [email or web address].

## 2024-08-10 NOTE — Assessment & Plan Note (Signed)
-   The patient will be admitted to an observation medical telemetry bed. - Will continue bronchodilator therapy with DuoNebs 4 times daily and every 4 hours as needed. - Mucolytic therapy will be provided. - Will continue steroid therapy with IV Solu-Medrol . - O2 protocol will be followed.

## 2024-08-10 NOTE — Plan of Care (Signed)

## 2024-08-10 NOTE — Assessment & Plan Note (Signed)
-   Will continue PPI therapy as well as Carafate .

## 2024-08-10 NOTE — Progress Notes (Signed)
 PHARMACIST - PHYSICIAN COMMUNICATION  CONCERNING:  Enoxaparin  (Lovenox ) for DVT Prophylaxis    RECOMMENDATION: Patient was prescribed enoxaprin 40mg  q24 hours for VTE prophylaxis.   There were no vitals filed for this visit.  There is no height or weight on file to calculate BMI.  Estimated Creatinine Clearance: 121.4 mL/min (by C-G formula based on SCr of 0.73 mg/dL).   Based on [facility] policy patient is candidate for enoxaparin  0.5mg /kg TBW SQ every 24 hours based on BMI being >30.  DESCRIPTION: Pharmacy has adjusted enoxaparin  dose per [facility] policy.  Patient is now receiving enoxaparin  0.5 mg/kg every 24 hours   Rankin CANDIE Dills, PharmD, [facility] 08/10/2024 3:10 AM

## 2024-08-10 NOTE — TOC CM/SW Note (Signed)
 Transition of Care [facility]) - Inpatient Brief Assessment   Patient Details  Name: ROSEMOND LYTTLE MRN: [id number] Date of Birth: Nov 28, 1985  Transition of Care [facility]) CM/SW Contact:    Corean ONEIDA Haddock, RN Phone Number: 08/10/2024, 1:42 PM   Clinical Narrative:   Transition of Care [facility]) Screening Note   Patient Details  Name: SHANIYA TASHIRO Date of Birth: Feb 22, 1986   Transition of Care [facility]) CM/SW Contact:    Corean ONEIDA Haddock, RN Phone Number: 08/10/2024, 1:42 PM    Transition of Care Department [facility]) has reviewed patient and no TOC needs have been identified at this time.  If new patient transition needs arise, please place a TOC consult.    Transition of Care Asessment: Insurance and Status: Insurance coverage has been reviewed Patient has primary care physician: Yes     Prior/Current Home Services: No current home services Social Drivers of Health Review: SDOH reviewed no interventions necessary Readmission risk has been reviewed: No (obs status.  no score generated) Transition of care needs: no transition of care needs at this time

## 2024-08-10 NOTE — H&P (Signed)
 [place]   PATIENT NAME: Melanie Whitaker    MR#:  [id number]  DATE OF BIRTH:  27-Mar-1986  DATE OF ADMISSION:  08/09/2024  PRIMARY CARE PHYSICIAN: Wellington Curtis LABOR, FNP   Patient is coming from: Home  REQUESTING/REFERRING PHYSICIAN: Ginnie Shams, MD  CHIEF COMPLAINT:   Chief Complaint  Patient presents with   Cough   Shortness of Breath   Generalized Body Aches    HISTORY OF PRESENT ILLNESS:  Melanie Whitaker is a 38 y.o. Caucasian female with medical history significant for anxiety, depression, GERD, who presented to the emergency room with acute onset of worsening productive cough with associated chest congestion and generalized bodyaches over the last 4 days.  She admitted to fever and sore throat.  No nausea or vomiting or abdominal pain.  She admitted to chest pressure with her cough described as heaviness as if an elephant is sitting on her chest.  She is a non-smoker and has no history of COPD or asthma.  She denied any abdominal pain or melena or bright red blood per rectum.  No dysuria, oliguria or hematuria or flank pain.  ED Course: When she came to the ER, temperature was 99.2 and BP 120/97 with otherwise normal vital signs.  Labs revealed unremarkable CMP and CBC.  Respiratory panel came back negative.  Pharyngeal Group A strep came back negative EKG as reviewed by me :  EKG showed normal sinus rhythm with rate of 85 with right axis deviation. Imaging: 2 view chest x-ray was normal.  The patient was given 3 DuoNebs, nebulized albuterol  2.5 mcg, 2 g IV magnesium sulfate and 60 mg of p.o. prednisone .  She will be admitted to a medical telemetry observation bed for further evaluation and management. PAST MEDICAL HISTORY:   Past Medical History:  Diagnosis Date   Allergy    Anemia    Anxiety    Depression    GERD (gastroesophageal reflux disease)    Manic depression (HCC)    Pneumonia    covid    PAST SURGICAL HISTORY:   Past Surgical History:   Procedure Laterality Date   ABDOMINAL HYSTERECTOMY  05/05/23   APPENDECTOMY     CHOLECYSTECTOMY     LAPAROSCOPIC VAGINAL HYSTERECTOMY WITH SALPINGECTOMY Bilateral 05/05/2023   Procedure: LAPAROSCOPIC ASSISTED VAGINAL HYSTERECTOMY WITH BILATERAL SALPINGECTOMY;  Surgeon: Connell Davies, MD;  Location: [facility] ORS;  Service: Gynecology;  Laterality: Bilateral;   TONSILLECTOMY     TUBAL LIGATION      SOCIAL HISTORY:   Social History   Tobacco Use   Smoking status: Never   Smokeless tobacco: Never  Substance Use Topics   Alcohol use: No    FAMILY HISTORY:   Family History  Problem Relation Age of Onset   COPD Mother    Uterine cancer Mother 30   Colon cancer Mother 68       MSH2+ Lynch syndrome   Diabetes Father    COPD Father    Crohn's disease Father    Heart attack Father    Diabetes Daughter    ADD / ADHD Son    Stroke Maternal Aunt    Crohn's disease Maternal Uncle    Breast cancer Paternal Aunt    Diabetes Paternal Uncle    Crohn's disease Paternal Uncle    Colon cancer Maternal Grandmother    Diabetes Maternal Grandfather    Diabetes Paternal Grandmother    Heart failure Paternal Grandmother    Dementia Paternal Grandmother  Heart failure Paternal Grandfather    Lung cancer Paternal Grandfather     DRUG ALLERGIES:   Allergies  Allergen Reactions   Lexapro [Escitalopram Oxalate] Other (See Comments)    SI during use   Sulfa Antibiotics Anaphylaxis   Penicillins    Phenergan [Promethazine Hcl] Other (See Comments)    halluciations     REVIEW OF SYSTEMS:   ROS As per history of present illness. All pertinent systems were reviewed above. Constitutional, HEENT, cardiovascular, respiratory, GI, GU, musculoskeletal, neuro, psychiatric, endocrine, integumentary and hematologic systems were reviewed and are otherwise negative/unremarkable except for positive findings mentioned above in the HPI.   MEDICATIONS AT HOME:   Prior to Admission medications    Medication Sig Start Date End Date Taking? Authorizing Provider  buPROPion (WELLBUTRIN XL) 150 MG 24 hr tablet Take 1 tablet (150 mg total) by mouth daily. 08/04/24  Yes Clifton, Kellie A, FNP  citalopram  (CELEXA ) 10 MG tablet Take 1 tablet (10 mg total) by mouth daily. Take in addition to 20 mg for total of 30 mg daily. 09/12/23  Yes Emilio Marseille T, FNP  citalopram  (CELEXA ) 20 MG tablet Take 1 tablet (20 mg total) by mouth daily. 09/12/23  Yes Emilio Marseille DASEN, FNP  gabapentin  (NEURONTIN ) 100 MG capsule Take 1 capsule (100 mg total) by mouth 3 (three) times daily. 04/22/24  Yes Wellington Beams A, FNP  naltrexone (DEPADE) 50 MG tablet Take 0.5 tablets (25 mg total) by mouth daily. 08/04/24  Yes Wellington Beams A, FNP  pantoprazole  (PROTONIX ) 40 MG tablet Take 1 tablet (40 mg total) by mouth daily. 08/05/24  Yes Wellington Beams LABOR, FNP  prochlorperazine  (COMPAZINE ) 5 MG tablet Take 1 tablet (5 mg total) by mouth every 6 (six) hours as needed for nausea or vomiting. 07/14/24  Yes Cleaster, Lauren A, PA-C  semaglutide -weight management (WEGOVY ) 1.7 MG/0.75ML SOAJ SQ injection Inject 1.7 mg into the skin once a week. 06/29/24  Yes Wellington Beams LABOR, FNP  albuterol  (VENTOLIN  HFA) 108 (90 Base) MCG/ACT inhaler Inhale 2 puffs into the lungs every 6 (six) hours as needed for wheezing or shortness of breath. Patient not taking: Reported on 08/10/2024 09/12/23   Emilio Marseille T, FNP  busPIRone  (BUSPAR ) 5 MG tablet Take 1 tablet (5 mg total) by mouth 3 (three) times daily. Patient not taking: Reported on 08/10/2024 09/12/23   Emilio Marseille T, FNP  sucralfate  (CARAFATE ) 1 g tablet Take 1 tablet (1 g total) by mouth 4 (four) times daily -  with meals and at bedtime. Patient not taking: Reported on 08/10/2024 09/12/23   Emilio Marseille T, FNP  traZODone  (DESYREL ) 100 MG tablet Take 1 tablet (100 mg total) by mouth at bedtime. Patient not taking: Reported on 08/10/2024 09/12/23   Emilio Marseille T, FNP      VITAL SIGNS:   Blood pressure 123/70, pulse 85, temperature 97.6 F (36.4 C), temperature source Oral, resp. rate 18, last menstrual period 03/28/2023, SpO2 96%.  PHYSICAL EXAMINATION:  Physical Exam  GENERAL:  38 y.o.-year-old Caucasian female patient lying in the bed with no acute distress.  EYES: Pupils equal, round, reactive to light and accommodation. No scleral icterus. Extraocular muscles intact.  HEENT: Head atraumatic, normocephalic. Oropharynx and nasopharynx clear.  NECK:  Supple, no jugular venous distention. No thyroid  enlargement, no tenderness.  LUNGS: Slightly diminished bibasilar breath sounds with diffuse expiratory wheezes and tight expiratory airflow.  No use of accessory muscles of respiration.  CARDIOVASCULAR: Regular rate and rhythm, S1, S2 normal. No  murmurs, rubs, or gallops.  ABDOMEN: Soft, nondistended, nontender. Bowel sounds present. No organomegaly or mass.  EXTREMITIES: No pedal edema, cyanosis, or clubbing.  NEUROLOGIC: Cranial nerves II through XII are intact. Muscle strength 5/5 in all extremities. Sensation intact. Gait not checked.  PSYCHIATRIC: The patient is alert and oriented x 3.  Normal affect and good eye contact. SKIN: No obvious rash, lesion, or ulcer.   LABORATORY PANEL:   CBC Recent Labs  Lab 08/10/24 0448  WBC 7.2  HGB 12.7  HCT 39.1  PLT 268   ------------------------------------------------------------------------------------------------------------------  Chemistries  Recent Labs  Lab 08/09/24 2336 08/10/24 0448  NA 137 138  K 4.0 4.4  CL 103 104  CO2 23 21*  GLUCOSE 90 138*  BUN 10 8  CREATININE 0.73 0.61  CALCIUM 8.9 9.0  AST 21  --   ALT 34  --   ALKPHOS 72  --   BILITOT 0.6  --    ------------------------------------------------------------------------------------------------------------------  Cardiac Enzymes No results for input(s): TROPONINI in the last 168  hours. ------------------------------------------------------------------------------------------------------------------  RADIOLOGY:  DG Chest 2 View Result Date: 08/09/2024 EXAM: 2 VIEW(S) XRAY OF THE CHEST 08/09/2024 08:55:40 PM COMPARISON: 9 / 18 / 23 CLINICAL HISTORY: cough/sob. Cough, shortness of breath, generalized body aches FINDINGS: LUNGS AND PLEURA: No focal pulmonary opacity. No pulmonary edema. No pleural effusion. No pneumothorax. HEART AND MEDIASTINUM: No acute abnormality of the cardiac and mediastinal silhouettes. BONES AND SOFT TISSUES: No acute osseous abnormality. IMPRESSION: 1. Normal chest radiograph. No acute cardiopulmonary process. Electronically signed by: Norman Gatlin MD 08/09/2024 08:59 PM EDT RP Workstation: [id number]      IMPRESSION AND PLAN:  Assessment and Plan: * Acute bronchitis with bronchospasm - The patient will be admitted to an observation medical telemetry bed. - Will continue bronchodilator therapy with DuoNebs 4 times daily and every 4 hours as needed. - Mucolytic therapy will be provided. - Will continue steroid therapy with IV Solu-Medrol . - O2 protocol will be followed.  GERD without esophagitis - Will continue PPI therapy as well as Carafate .  Anxiety and depression - Will continue BuSpar  and will Wellbutrin XL as well as Celexa .   DVT prophylaxis: Lovenox .  Advanced Care Planning:  Code Status: full code.  Family Communication:  The plan of care was discussed in details with the patient (and family). I answered all questions. The patient agreed to proceed with the above mentioned plan. Further management will depend upon hospital course. Disposition Plan: Back to previous home environment Consults called: none.  All the records are reviewed and case discussed with ED provider.  Status is: Observation.  I certify that at the time of admission, it is my clinical judgment that the patient will require hospital care extending less  than 2 midnights.                            Dispo: The patient is from: Home              Anticipated d/c is to: Home              Patient currently is not medically stable to d/c.              Difficult to place patient: No  Madison DELENA Peaches M.D on 08/10/2024 at 7:44 AM  [facility]   From 7 PM-7 AM, contact night-coverage [email or web address]  CC: Primary care physician; Wellington Curtis DELENA, FNP

## 2024-08-10 NOTE — Assessment & Plan Note (Signed)
-   Will continue BuSpar  and will Wellbutrin XL as well as Celexa .

## 2024-08-11 DIAGNOSIS — J209 Acute bronchitis, unspecified: Secondary | ICD-10-CM | POA: Diagnosis not present

## 2024-08-11 LAB — BASIC METABOLIC PANEL WITH GFR
Anion gap: 10 (ref 5–15)
BUN: 14 mg/dL (ref 6–20)
CO2: 22 mmol/L (ref 22–32)
Calcium: 8.9 mg/dL (ref 8.9–10.3)
Chloride: 106 mmol/L (ref 98–111)
Creatinine, Ser: 0.71 mg/dL (ref 0.44–1.00)
GFR, Estimated: >60 mL/min (ref >60)
Glucose, Bld: 135 mg/dL — ABNORMAL HIGH (ref 70–99)
Potassium: 3.9 mmol/L (ref 3.5–5.1)
Sodium: 138 mmol/L (ref 135–145)

## 2024-08-11 MED ORDER — IPRATROPIUM BROMIDE 0.02 % IN SOLN
0.5000 mg | Freq: Four times a day (QID) | RESPIRATORY_TRACT | Status: DC
Start: 2024-08-11 — End: 2024-08-13
  Administered 2024-08-11 – 2024-08-12 (×3): 0.5 mg via RESPIRATORY_TRACT
  Filled 2024-08-11 (×3): qty 2.5

## 2024-08-11 MED ORDER — IPRATROPIUM-ALBUTEROL 0.5-2.5 (3) MG/3ML IN SOLN: 3.0000 mL | RESPIRATORY_TRACT | Status: DC | PRN

## 2024-08-11 MED ORDER — IPRATROPIUM BROMIDE 0.02 % IN SOLN
0.5000 mg | RESPIRATORY_TRACT | Status: DC
  Administered 2024-08-11: 0.5 mg via RESPIRATORY_TRACT
  Filled 2024-08-11: qty 2.5

## 2024-08-11 NOTE — Progress Notes (Signed)
 Progress Note   Patient: Melanie Whitaker FMW:[id number] DOB: 1986/08/30 DOA: 08/09/2024     1 DOS: the patient was seen and examined on 08/11/2024   Brief hospital course: Melanie Whitaker is a 38 y.o. Caucasian female with medical history significant for anxiety, depression, GERD, who presented to the emergency room with acute onset of worsening productive cough with associated chest congestion and generalized bodyaches over the last 4 days.  She admitted to fever and sore throat.  No nausea or vomiting or abdominal pain.  She admitted to chest pressure with her cough described as heaviness as if an elephant is sitting on her chest.  She is a non-smoker and has no history of COPD or asthma.  She denied any abdominal pain or melena or bright red blood per rectum.  No dysuria, oliguria or hematuria or flank pain.   ED Course: When she came to the ER, temperature was 99.2 and BP 120/97 with otherwise normal vital signs.  Labs revealed unremarkable CMP and CBC.  Respiratory panel came back negative.  Pharyngeal Group A strep came back negative EKG as reviewed by me :  EKG showed normal sinus rhythm with rate of 85 with right axis deviation. Imaging: 2 view chest x-ray was normal.   The patient was given 3 DuoNebs, nebulized albuterol  2.5 mcg, 2 g IV magnesium sulfate and 60 mg of p.o. prednisone .  She will be admitted to a medical telemetry observation bed for further evaluation and management.  Assessment and Plan: * Acute bronchitis with bronchospasm 2/2 Rhinovirus  Husband with viral URTI.  Will continue with ipratropium nebs, patient feels albuterol  making her mildly anxioius  Continue mucolytic therapy  Continue steroid therapy with IV Solu-Medrol . - O2 protocol will be followed.  GERD without esophagitis - Will continue PPI therapy   Anxiety and depression - Will continue bupropion, Celexa        Subjective: Continues to have dyspnea with minimal exertion.   Physical Exam: Vitals:    08/10/24 2046 08/11/24 0440 08/11/24 1142 08/11/24 1614  BP:  115/73  114/73  Pulse:  70  74  Resp:  18  18  Temp:  98.4 F (36.9 C)  98.7 F (37.1 C)  TempSrc:  Oral    SpO2: 95% 95% 95% 96%   Physical Exam  Constitutional: In no distress.  Cardiovascular: Normal rate, regular rhythm. No lower extremity edema  Pulmonary: Non labored breathing on room air, no rales. Diffuse wheezing  Abdominal: Soft. Non distended and non tender Musculoskeletal: Normal range of motion.     Neurological: Alert and oriented to person, place, and time. Non focal  Skin: Skin is warm and dry.   Data Reviewed:     Latest Ref Rng & Units 08/10/2024    4:48 AM 08/09/2024   11:36 PM 07/14/2024    1:13 PM  CBC  WBC 4.0 - 10.5 K/uL 7.2  8.6  8.6   Hemoglobin 12.0 - 15.0 g/dL 87.2  87.2  86.1   Hematocrit 36.0 - 46.0 % 39.1  37.9  42.5   Platelets 150 - 400 K/uL 268  261  291       Latest Ref Rng & Units 08/11/2024    5:31 AM 08/10/2024    4:48 AM 08/09/2024   11:36 PM  BMP  Glucose 70 - 99 mg/dL 864  861  90   BUN 6 - 20 mg/dL 14  8  10    Creatinine 0.44 - 1.00 mg/dL 9.28  9.38  0.73   Sodium 135 - 145 mmol/L 138  138  137   Potassium 3.5 - 5.1 mmol/L 3.9  4.4  4.0   Chloride 98 - 111 mmol/L 106  104  103   CO2 22 - 32 mmol/L 22  21  23    Calcium 8.9 - 10.3 mg/dL 8.9  9.0  8.9      Family Communication: None  Disposition: Status is: Inpatient Remains inpatient appropriate because: futher treatment of acute bronchitis, remains signficantly symptomatic   Planned Discharge Destination: Home    Time spent: 35 minutes  Author: Alban Pepper, MD 08/11/2024 5:36 PM  For on call review [email or web address].

## 2024-08-11 NOTE — Plan of Care (Signed)

## 2024-08-12 ENCOUNTER — Other Ambulatory Visit: Payer: Self-pay

## 2024-08-12 DIAGNOSIS — J209 Acute bronchitis, unspecified: Secondary | ICD-10-CM | POA: Diagnosis not present

## 2024-08-12 DIAGNOSIS — R062 Wheezing: Secondary | ICD-10-CM | POA: Diagnosis not present

## 2024-08-12 DIAGNOSIS — F32A Depression, unspecified: Secondary | ICD-10-CM | POA: Diagnosis not present

## 2024-08-12 DIAGNOSIS — F419 Anxiety disorder, unspecified: Secondary | ICD-10-CM | POA: Diagnosis not present

## 2024-08-12 LAB — BASIC METABOLIC PANEL WITH GFR
Anion gap: 8 (ref 5–15)
BUN: 13 mg/dL (ref 6–20)
CO2: 24 mmol/L (ref 22–32)
Calcium: 9.1 mg/dL (ref 8.9–10.3)
Chloride: 105 mmol/L (ref 98–111)
Creatinine, Ser: 0.57 mg/dL (ref 0.44–1.00)
GFR, Estimated: >60 mL/min (ref >60)
Glucose, Bld: 128 mg/dL — ABNORMAL HIGH (ref 70–99)
Potassium: 4.4 mmol/L (ref 3.5–5.1)
Sodium: 137 mmol/L (ref 135–145)

## 2024-08-12 MED ORDER — ALBUTEROL SULFATE HFA 108 (90 BASE) MCG/ACT IN AERS
2.0000 | INHALATION_SPRAY | Freq: Four times a day (QID) | RESPIRATORY_TRACT | 0 refills | Status: AC | PRN
  Filled 2024-08-12: qty 6.7, 7d supply, fill #0

## 2024-08-12 MED ORDER — AZITHROMYCIN 500 MG PO TABS
500.0000 mg | ORAL_TABLET | Freq: Every day | ORAL | 0 refills | Status: AC
  Filled 2024-08-12: qty 5, 5d supply, fill #0

## 2024-08-12 MED ORDER — PREDNISONE 10 MG PO TABS
ORAL_TABLET | ORAL | 0 refills | Status: AC
  Filled 2024-08-12: qty 21, 6d supply, fill #0

## 2024-08-12 MED ORDER — DM-GUAIFENESIN ER 30-600 MG PO TB12
1.0000 | ORAL_TABLET | Freq: Two times a day (BID) | ORAL | 0 refills | Status: AC
  Filled 2024-08-12: qty 20, 10d supply, fill #0

## 2024-08-12 NOTE — Plan of Care (Signed)

## 2024-08-12 NOTE — Plan of Care (Signed)
  Problem: Education: Goal: Knowledge of General Education information will improve Description: Including pain rating scale, medication(s)/side effects and non-pharmacologic comfort measures 08/12/2024 1609 by Collins Julietta GAILS, RN Outcome: Adequate for Discharge 08/12/2024 1519 by Collins Julietta GAILS, RN Outcome: Progressing   Problem: Health Behavior/Discharge Planning: Goal: Ability to manage health-related needs will improve 08/12/2024 1609 by Collins Julietta GAILS, RN Outcome: Adequate for Discharge 08/12/2024 1519 by Collins Julietta GAILS, RN Outcome: Progressing   Problem: Clinical Measurements: Goal: Ability to maintain clinical measurements within normal limits will improve 08/12/2024 1609 by Collins Julietta GAILS, RN Outcome: Adequate for Discharge 08/12/2024 1519 by Collins Julietta GAILS, RN Outcome: Progressing Goal: Will remain free from infection 08/12/2024 1609 by Collins Julietta GAILS, RN Outcome: Adequate for Discharge 08/12/2024 1519 by Collins Julietta GAILS, RN Outcome: Progressing Goal: Diagnostic test results will improve 08/12/2024 1609 by Collins Julietta GAILS, RN Outcome: Adequate for Discharge 08/12/2024 1519 by Collins Julietta GAILS, RN Outcome: Progressing Goal: Respiratory complications will improve 08/12/2024 1609 by Collins Julietta GAILS, RN Outcome: Adequate for Discharge 08/12/2024 1519 by Collins Julietta GAILS, RN Outcome: Progressing Goal: Cardiovascular complication will be avoided 08/12/2024 1609 by Collins Julietta GAILS, RN Outcome: Adequate for Discharge 08/12/2024 1519 by Collins Julietta GAILS, RN Outcome: Progressing   Problem: Activity: Goal: Risk for activity intolerance will decrease 08/12/2024 1609 by Collins Julietta GAILS, RN Outcome: Adequate for Discharge 08/12/2024 1519 by Collins Julietta GAILS, RN Outcome: Progressing   Problem: Nutrition: Goal: Adequate nutrition will be maintained 08/12/2024 1609 by Collins Julietta GAILS, RN Outcome:  Adequate for Discharge 08/12/2024 1519 by Collins Julietta GAILS, RN Outcome: Progressing   Problem: Coping: Goal: Level of anxiety will decrease 08/12/2024 1609 by Collins Julietta GAILS, RN Outcome: Adequate for Discharge 08/12/2024 1519 by Collins Julietta GAILS, RN Outcome: Progressing   Problem: Elimination: Goal: Will not experience complications related to bowel motility 08/12/2024 1609 by Collins Julietta GAILS, RN Outcome: Adequate for Discharge 08/12/2024 1519 by Collins Julietta GAILS, RN Outcome: Progressing Goal: Will not experience complications related to urinary retention 08/12/2024 1609 by Collins Julietta GAILS, RN Outcome: Adequate for Discharge 08/12/2024 1519 by Collins Julietta GAILS, RN Outcome: Progressing   Problem: Pain Managment: Goal: General experience of comfort will improve and/or be controlled 08/12/2024 1609 by Collins Julietta GAILS, RN Outcome: Adequate for Discharge 08/12/2024 1519 by Collins Julietta GAILS, RN Outcome: Progressing   Problem: Safety: Goal: Ability to remain free from injury will improve 08/12/2024 1609 by Collins Julietta GAILS, RN Outcome: Adequate for Discharge 08/12/2024 1519 by Collins Julietta GAILS, RN Outcome: Progressing   Problem: Skin Integrity: Goal: Risk for impaired skin integrity will decrease 08/12/2024 1609 by Collins Julietta GAILS, RN Outcome: Adequate for Discharge 08/12/2024 1519 by Collins Julietta GAILS, RN Outcome: Progressing

## 2024-08-12 NOTE — Progress Notes (Signed)
 Discharge education completed. IV removed. Patient is getting dressed. Waiting on M2[employer] and Ride.  Melanie Whitaker

## 2024-08-13 ENCOUNTER — Other Ambulatory Visit: Payer: Self-pay | Admitting: Family Medicine

## 2024-08-13 ENCOUNTER — Telehealth: Payer: Self-pay

## 2024-08-13 DIAGNOSIS — R051 Acute cough: Secondary | ICD-10-CM

## 2024-08-13 MED ORDER — HYDROCOD POLI-CHLORPHE POLI ER 10-8 MG/5ML PO SUER
5.0000 mL | Freq: Two times a day (BID) | ORAL | 0 refills | Status: DC | PRN
Start: 2024-08-13 — End: 2024-09-02

## 2024-08-13 NOTE — Telephone Encounter (Signed)
 Copied from CRM [id number]. Topic: Clinical - Medication Question >> Aug 13, 2024  3:01 PM Melanie Whitaker wrote: Reason for CRM: Patient is requesting a rx for Tussionex. She spoke with her provider and stated she will temporarily stop taking naltrexone while on requested medication. Patient's cough has not improved and does not want to go back to the ER

## 2024-08-13 NOTE — Transitions of Care (Post Inpatient/ED Visit) (Signed)
   08/13/2024  Name: Melanie Whitaker MRN: [id number] DOB: 1986-07-05  Today's TOC FU Call Status: Today's TOC FU Call Status:: Unsuccessful Call (1st Attempt) Unsuccessful Call (1st Attempt) Date: 08/13/24  Attempted to reach the patient regarding the most recent Inpatient/ED visit. Left a HIPAA approved voicemail message to phone number provided in demographics per DPR.    Follow Up Plan: Additional outreach attempts will be made to reach the patient to complete the Transitions of Care (Post Inpatient/ED visit) call.   Richerd Fish, RN, BSN, CCM [facility], [facility] Management Coordinator Direct Dial: [phone]

## 2024-08-13 NOTE — Telephone Encounter (Signed)
 Left message that prescription was sent in today.   Disp Refills Start End   chlorpheniramine-HYDROcodone (TUSSIONEX) 10-8 MG/5ML 70 mL 0 08/13/2024 --   Sig - Route: Take 5 mLs by mouth every 12 (twelve) hours as needed for cough. - Oral   Sent to pharmacy as: chlorpheniramine-HYDROcodone (TUSSIONEX) 10-8 MG/5ML   Earliest Fill Date: 08/13/2024   Notes to Pharmacy: Please instructed patient to STOP naltrexone for weight loss while on med   E-Prescribing Status: Receipt confirmed by pharmacy (08/13/2024  3:38 PM EDT)

## 2024-08-14 NOTE — Discharge Summary (Signed)
 Physician Discharge Summary   Patient: Melanie Whitaker MRN: [id number] DOB: [birth year]  Admit date:     08/09/2024  Discharge date: 08/12/2024  Discharge Physician: Cresencio Fairly   PCP: Wellington Curtis LABOR, FNP   Recommendations at discharge:    F/up with outpt providers as requested  Discharge Diagnoses: Principal Problem:   Acute bronchitis with bronchospasm Active Problems:   Acute bronchitis   Anxiety and depression   GERD without esophagitis   Wheezing  Hospital Course: Assessment and Plan:  38 y.o. Caucasian female with medical history significant for anxiety, depression, GERD, who presented to the emergency room with acute onset of worsening productive cough with associated chest congestion and generalized bodyaches over the last 4 days.  She admitted to fever and sore throat.  No nausea or vomiting or abdominal pain.  She admitted to chest pressure with her cough described as heaviness as if an elephant is sitting on her chest.  She is a non-smoker and has no history of COPD or asthma.  She denied any abdominal pain or melena or bright red blood per rectum.  No dysuria, oliguria or hematuria or flank pain.   ED Course: When she came to the ER, temperature was 99.2 and BP 120/97 with otherwise normal vital signs.  Labs revealed unremarkable CMP and CBC.  Respiratory panel came back negative.  Pharyngeal Group A strep came back negative EKG as reviewed by me :  EKG showed normal sinus rhythm with rate of 85 with right axis deviation. Imaging: 2 view chest x-ray was normal.   The patient was given 3 DuoNebs, nebulized albuterol  2.5 mcg, 2 g IV magnesium sulfate and 60 mg of p.o. prednisone .  She will be admitted to a medical telemetry observation bed for further evaluation and management.   Assessment and Plan: * Acute bronchitis with bronchospasm 2/2 Rhinovirus  Husband with viral URTI.  Respi panel -> rhinovirus + Improved with steroids and nebs   GERD without esophagitis -  continue PPI therapy    Anxiety and depression -continue bupropion, Celexa         Disposition: Home Diet recommendation:  Discharge Diet Orders (From admission, onward)     Start     Ordered   08/12/24 0000  Diet - low sodium heart healthy        08/12/24 1604           Carb modified diet DISCHARGE MEDICATION: Allergies as of 08/12/2024       Reactions   Lexapro [escitalopram Oxalate] Other (See Comments)   SI during use   Sulfa Antibiotics Anaphylaxis   Penicillins    Phenergan [promethazine Hcl] Other (See Comments)   halluciations        Medication List     TAKE these medications    albuterol  108 (90 Base) MCG/ACT inhaler Commonly known as: VENTOLIN  HFA Inhale 2 puffs into the lungs every 6 (six) hours as needed for up to 7 days for wheezing or shortness of breath.   azithromycin  500 MG tablet Commonly known as: Zithromax  Take 1 tablet (500 mg total) by mouth daily for 5 days.   buPROPion 150 MG 24 hr tablet Commonly known as: Wellbutrin XL Take 1 tablet (150 mg total) by mouth daily.   citalopram  10 MG tablet Commonly known as: CELEXA  Take 1 tablet (10 mg total) by mouth daily. Take in addition to 20 mg for total of 30 mg daily.   citalopram  20 MG tablet Commonly known as: CELEXA  Take 1  tablet (20 mg total) by mouth daily.   FT Mucus Relief DM 30-600 MG Tb12 Take 1 tablet by mouth 2 (two) times daily for 7 days.   gabapentin  100 MG capsule Commonly known as: NEURONTIN  Take 1 capsule (100 mg total) by mouth 3 (three) times daily.   naltrexone 50 MG tablet Commonly known as: DEPADE Take 0.5 tablets (25 mg total) by mouth daily.   pantoprazole  40 MG tablet Commonly known as: PROTONIX  Take 1 tablet (40 mg total) by mouth daily.   predniSONE  10 MG tablet Commonly known as: DELTASONE  Take 6 tablets (60 mg total) by mouth daily for 1 day, THEN 5 tablets (50 mg total) daily for 1 day, THEN 4 tablets (40 mg total) daily for 1 day, THEN 3  tablets (30 mg total) daily for 1 day, THEN 2 tablets (20 mg total) daily for 1 day, THEN 1 tablet (10 mg total) daily for 1 day. Start taking on: August 12, 2024   prochlorperazine  5 MG tablet Commonly known as: COMPAZINE  Take 1 tablet (5 mg total) by mouth every 6 (six) hours as needed for nausea or vomiting.   semaglutide -weight management 1.7 MG/0.75ML Soaj SQ injection Commonly known as: WEGOVY  Inject 1.7 mg into the skin once a week.        Follow-up Information     Wellington Curtis LABOR, FNP. Schedule an appointment as soon as possible for a visit in 1 week(s).   Specialty: Family Medicine Why: [facility] Discharge F/UP Contact information: [address] [place] [address] [phone]                Discharge Exam: There were no vitals filed for this visit. Constitutional: In no distress.  Cardiovascular: Normal rate, regular rhythm. No lower extremity edema  Pulmonary: Non labored breathing on room air, No wheezing, no rales  Abdominal: Soft. Non distended and non tender Musculoskeletal: Normal range of motion.     Neurological: Alert and oriented to person, place, and time. Non focal  Skin: Skin is warm and dry.   Condition at discharge: good  The results of significant diagnostics from this hospitalization (including imaging, microbiology, ancillary and laboratory) are listed below for reference.   Imaging Studies: DG Chest 2 View Result Date: 08/09/2024 EXAM: 2 VIEW(S) XRAY OF THE CHEST 08/09/2024 08:55:40 PM COMPARISON: 9 / 18 / 23 CLINICAL HISTORY: cough/sob. Cough, shortness of breath, generalized body aches FINDINGS: LUNGS AND PLEURA: No focal pulmonary opacity. No pulmonary edema. No pleural effusion. No pneumothorax. HEART AND MEDIASTINUM: No acute abnormality of the cardiac and mediastinal silhouettes. BONES AND SOFT TISSUES: No acute osseous abnormality. IMPRESSION: 1. Normal chest radiograph. No acute cardiopulmonary process.  Electronically signed by: Norman Gatlin MD 08/09/2024 08:59 PM EDT RP Workstation: [id number]    Microbiology: Results for orders placed or performed during the hospital encounter of 08/09/24  Resp panel by RT-PCR (RSV, Flu A&B, Covid) Anterior Nasal Swab     Status: None   Collection Time: 08/09/24  8:45 PM   Specimen: Anterior Nasal Swab  Result Value Ref Range Status   SARS Coronavirus 2 by RT PCR NEGATIVE NEGATIVE Final    Comment: (NOTE) SARS-CoV-2 target nucleic acids are NOT DETECTED.  The SARS-CoV-2 RNA is generally detectable in upper respiratory specimens during the acute phase of infection. The lowest concentration of SARS-CoV-2 viral copies this assay can detect is 138 copies/mL. A negative result does not preclude SARS-Cov-2 infection and should not be used as the sole  basis for treatment or other patient management decisions. A negative result may occur with  improper specimen collection/handling, submission of specimen other than nasopharyngeal swab, presence of viral mutation(s) within the areas targeted by this assay, and inadequate number of viral copies(<138 copies/mL). A negative result must be combined with clinical observations, patient history, and epidemiological information. The expected result is Negative.  Fact Sheet for Patients:  bloggercourse.com  Fact Sheet for Healthcare Providers:  seriousbroker.it  This test is no t yet approved or cleared by the [place]  FDA and  has been authorized for detection and/or diagnosis of SARS-CoV-2 by FDA under an Emergency Use Authorization (EUA). This EUA will remain  in effect (meaning this test can be used) for the duration of the COVID-19 declaration under Section 564(b)(1) of the Act, 21 U.S.C.section 360bbb-3(b)(1), unless the authorization is terminated  or revoked sooner.       Influenza A by PCR NEGATIVE NEGATIVE Final   Influenza B by PCR  NEGATIVE NEGATIVE Final    Comment: (NOTE) The Xpert Xpress SARS-CoV-2/FLU/RSV plus assay is intended as an aid in the diagnosis of influenza from Nasopharyngeal swab specimens and should not be used as a sole basis for treatment. Nasal washings and aspirates are unacceptable for Xpert Xpress SARS-CoV-2/FLU/RSV testing.  Fact Sheet for Patients: bloggercourse.com  Fact Sheet for Healthcare Providers: seriousbroker.it  This test is not yet approved or cleared by the [place]  FDA and has been authorized for detection and/or diagnosis of SARS-CoV-2 by FDA under an Emergency Use Authorization (EUA). This EUA will remain in effect (meaning this test can be used) for the duration of the COVID-19 declaration under Section 564(b)(1) of the Act, 21 U.S.C. section 360bbb-3(b)(1), unless the authorization is terminated or revoked.     Resp Syncytial Virus by PCR NEGATIVE NEGATIVE Final    Comment: (NOTE) Fact Sheet for Patients: bloggercourse.com  Fact Sheet for Healthcare Providers: seriousbroker.it  This test is not yet approved or cleared by the [place]  FDA and has been authorized for detection and/or diagnosis of SARS-CoV-2 by FDA under an Emergency Use Authorization (EUA). This EUA will remain in effect (meaning this test can be used) for the duration of the COVID-19 declaration under Section 564(b)(1) of the Act, 21 U.S.C. section 360bbb-3(b)(1), unless the authorization is terminated or revoked.  Performed at [facility], [address] [place], [place] [address]   Group A Strep by PCR if patient complains of sore throat.     Status: None   Collection Time: 08/09/24  8:45 PM   Specimen: Anterior Nasal Swab; Sterile Swab  Result Value Ref Range Status   Group A Strep by PCR NOT DETECTED NOT DETECTED Final    Comment: Performed at [facility],  [address] [place], [place] [address]  Respiratory (~20 pathogens) panel by PCR     Status: Abnormal   Collection Time: 08/10/24  7:59 AM   Specimen: Nasopharyngeal Swab; Respiratory  Result Value Ref Range Status   Adenovirus NOT DETECTED NOT DETECTED Final   Coronavirus 229E NOT DETECTED NOT DETECTED Final    Comment: (NOTE) The Coronavirus on the Respiratory Panel, DOES NOT test for the novel  Coronavirus (2019 nCoV)    Coronavirus HKU1 NOT DETECTED NOT DETECTED Final   Coronavirus NL63 NOT DETECTED NOT DETECTED Final   Coronavirus OC43 NOT DETECTED NOT DETECTED Final   Metapneumovirus NOT DETECTED NOT DETECTED Final   Rhinovirus / Enterovirus DETECTED (A) NOT DETECTED Final   Influenza  A NOT DETECTED NOT DETECTED Final   Influenza B NOT DETECTED NOT DETECTED Final   Parainfluenza Virus 1 NOT DETECTED NOT DETECTED Final   Parainfluenza Virus 2 NOT DETECTED NOT DETECTED Final   Parainfluenza Virus 3 NOT DETECTED NOT DETECTED Final   Parainfluenza Virus 4 NOT DETECTED NOT DETECTED Final   Respiratory Syncytial Virus NOT DETECTED NOT DETECTED Final   Bordetella pertussis NOT DETECTED NOT DETECTED Final   Bordetella Parapertussis NOT DETECTED NOT DETECTED Final   Chlamydophila pneumoniae NOT DETECTED NOT DETECTED Final   Mycoplasma pneumoniae NOT DETECTED NOT DETECTED Final    Comment: Performed at [facility], [address] [place], [place] [address]    Labs: CBC: Recent Labs  Lab 08/09/24 2336 08/10/24 0448  WBC 8.6 7.2  HGB 12.7 12.7  HCT 37.9 39.1  MCV 89.2 90.1  PLT 261 268   Basic Metabolic Panel: Recent Labs  Lab 08/09/24 2336 08/10/24 0448 08/11/24 0531 08/12/24 0346  NA 137 138 138 137  K 4.0 4.4 3.9 4.4  CL 103 104 106 105  CO2 23 21* 22 24  GLUCOSE 90 138* 135* 128*  BUN 10 8 14 13   CREATININE 0.73 0.61 0.71 0.57  CALCIUM 8.9 9.0 8.9 9.1   Liver Function Tests: Recent Labs  Lab 08/09/24 2336  AST 21  ALT 34  ALKPHOS 72  BILITOT  0.6  PROT 7.3  ALBUMIN 3.7   CBG: No results for input(s): GLUCAP in the last 168 hours.  Discharge time spent: greater than 30 minutes.  Signed: Cresencio Fairly, MD [facility] 08/14/2024

## 2024-08-16 ENCOUNTER — Telehealth: Payer: Self-pay

## 2024-08-16 NOTE — Transitions of Care (Post Inpatient/ED Visit) (Signed)
   08/16/2024  Name: SHANDI GODFREY MRN: [id number] DOB: Aug 30, 1986  Today's TOC FU Call Status: Today's TOC FU Call Status:: Unsuccessful Call (2nd Attempt) Unsuccessful Call (2nd Attempt) Date: 08/16/24  Attempted to reach the patient regarding the most recent Inpatient/ED visit. Left a HIPAA approved voicemail message to phone number provided in demographics per DPR.    Follow Up Plan: Additional outreach attempts will be made to reach the patient to complete the Transitions of Care (Post Inpatient/ED visit) call.   Richerd Fish, RN, BSN, CCM [facility], [facility] Management Coordinator Direct Dial: [phone]

## 2024-08-17 ENCOUNTER — Telehealth: Payer: Self-pay

## 2024-08-17 NOTE — Transitions of Care (Post Inpatient/ED Visit) (Signed)
   08/17/2024  Name: MELODIE ASHWORTH MRN: [id number] DOB: Mar 15, 1986  Today's TOC FU Call Status: Today's TOC FU Call Status:: Unsuccessful Call (3rd Attempt) Unsuccessful Call (3rd Attempt) Date: 08/17/24  Attempted to reach the patient regarding the most recent Inpatient/ED visit. Unable to leave a HIPAA approved voicemail message to phone number provided in demographics per DPR due to hang up to call.    Follow Up Plan: No further outreach attempts will be made at this time. We have been unable to contact the patient.  Richerd Fish, RN, BSN, CCM [facility], [facility] Management Coordinator Direct Dial: [phone]

## 2024-09-02 ENCOUNTER — Other Ambulatory Visit: Payer: Self-pay | Admitting: Family Medicine

## 2024-09-02 DIAGNOSIS — G629 Polyneuropathy, unspecified: Secondary | ICD-10-CM

## 2024-09-02 DIAGNOSIS — E6609 Other obesity due to excess calories: Secondary | ICD-10-CM

## 2024-09-02 DIAGNOSIS — N951 Menopausal and female climacteric states: Secondary | ICD-10-CM

## 2024-09-02 MED ORDER — NALTREXONE HCL 50 MG PO TABS: 50.0000 mg | ORAL_TABLET | Freq: Every day | ORAL | 0 refills | Status: AC

## 2024-09-02 MED ORDER — GABAPENTIN 100 MG PO CAPS: 200.0000 mg | ORAL_CAPSULE | Freq: Three times a day (TID) | ORAL | 1 refills | Status: AC

## 2024-09-08 ENCOUNTER — Other Ambulatory Visit: Payer: Self-pay | Admitting: Family Medicine

## 2024-09-08 DIAGNOSIS — E6609 Other obesity due to excess calories: Secondary | ICD-10-CM

## 2024-09-08 NOTE — Telephone Encounter (Signed)
 Copied from CRM [id number]. Topic: Clinical - Medication Refill >> Sep 08, 2024 10:22 AM Mia F wrote: Medication: buPROPion (WELLBUTRIN XL) 150 MG 24 hr tablet   Has the patient contacted their pharmacy? Yes (Agent: If no, request that the patient contact the pharmacy for the refill. If patient does not wish to contact the pharmacy document the reason why and proceed with request.) (Agent: If yes, when and what did the pharmacy advise?)  This is the patient's preferred pharmacy:  [facility] [address] ([address]), [place] - [address] [place]) [place] [address] Phone: [phone] Fax: [phone]  Is this the correct pharmacy for this prescription? Yes If no, delete pharmacy and type the correct one.   Has the prescription been filled recently? Yes  Is the patient out of the medication? Yes  Has the patient been seen for an appointment in the last year OR does the patient have an upcoming appointment? Yes  Can we respond through MyChart? Yes  Agent: Please be advised that Rx refills may take up to 3 business days. We ask that you follow-up with your pharmacy.

## 2024-09-10 NOTE — Telephone Encounter (Signed)
 Rx 08/04/24 #90- too soon Requested Prescriptions  Pending Prescriptions Disp Refills   buPROPion  (WELLBUTRIN  XL) 150 MG 24 hr tablet 90 tablet 0    Sig: Take 1 tablet (150 mg total) by mouth daily.     Psychiatry: Antidepressants - bupropion  Passed - 09/10/2024  2:03 PM      Passed - Cr in normal range and within 360 days    Creatinine  Date Value Ref Range Status  10/04/2012 0.69 0.60 - 1.30 mg/dL Final   Creatinine, Ser  Date Value Ref Range Status  08/12/2024 0.57 0.44 - 1.00 mg/dL Final         Passed - AST in normal range and within 360 days    AST  Date Value Ref Range Status  08/09/2024 21 15 - 41 U/L Final   SGOT(AST)  Date Value Ref Range Status  10/04/2012 30 15 - 37 Unit/L Final         Passed - ALT in normal range and within 360 days    ALT  Date Value Ref Range Status  08/09/2024 34 0 - 44 U/L Final   SGPT (ALT)  Date Value Ref Range Status  10/04/2012 58 12 - 78 U/L Final         Passed - Completed PHQ-2 or PHQ-9 in the last 360 days      Passed - Last BP in normal range    BP Readings from Last 1 Encounters:  08/12/24 129/85         Passed - Valid encounter within last 6 months    Recent Outpatient Visits           1 month ago Class 2 obesity due to excess calories without serious comorbidity with body mass index (BMI) of 35.0 to 35.9 in adult   [facility] [place], Curtis LABOR, FNP   3 months ago Acute cough   [facility] [place], Curtis LABOR, FNP   4 months ago History of apnea   [facility] [place], Curtis LABOR, FNP   6 months ago Numbness and tingling of both feet   [facility] [place] BRAVO, MD   9 months ago Prediabetes   [facility] [place], Curtis LABOR, [place]

## 2024-09-13 ENCOUNTER — Encounter: Payer: Medicaid Other | Admitting: Family Medicine

## 2024-09-13 ENCOUNTER — Encounter: Admitting: Family Medicine

## 2024-09-23 ENCOUNTER — Other Ambulatory Visit (HOSPITAL_COMMUNITY): Payer: Self-pay

## 2024-09-24 ENCOUNTER — Encounter: Admitting: Family Medicine

## 2024-09-29 ENCOUNTER — Other Ambulatory Visit (HOSPITAL_COMMUNITY): Payer: Self-pay

## 2024-10-05 ENCOUNTER — Encounter: Payer: Self-pay | Admitting: Family Medicine

## 2024-10-06 ENCOUNTER — Telehealth: Payer: Self-pay | Admitting: Family Medicine

## 2024-10-06 ENCOUNTER — Other Ambulatory Visit: Payer: Self-pay

## 2024-10-06 DIAGNOSIS — F411 Generalized anxiety disorder: Secondary | ICD-10-CM

## 2024-10-06 DIAGNOSIS — F339 Major depressive disorder, recurrent, unspecified: Secondary | ICD-10-CM

## 2024-10-06 NOTE — Telephone Encounter (Addendum)
 [facility] faxed refill request for the following medications:  citalopram  (CELEXA ) 10 MG tablet   citalopram  (CELEXA ) 20 MG tablet    Please advise.

## 2024-10-07 MED ORDER — CITALOPRAM HYDROBROMIDE 20 MG PO TABS: 20.0000 mg | ORAL_TABLET | Freq: Every day | ORAL | 3 refills | Status: AC

## 2024-10-07 MED ORDER — CITALOPRAM HYDROBROMIDE 10 MG PO TABS: 10.0000 mg | ORAL_TABLET | Freq: Every day | ORAL | 3 refills | Status: AC

## 2024-10-22 ENCOUNTER — Encounter: Payer: Self-pay | Admitting: Family Medicine

## 2024-10-22 ENCOUNTER — Ambulatory Visit: Admitting: Family Medicine

## 2024-10-22 VITALS — BP 120/85 | HR 111 | Resp 16 | Ht 70.0 in | Wt 239.8 lb

## 2024-10-22 DIAGNOSIS — L03114 Cellulitis of left upper limb: Secondary | ICD-10-CM

## 2024-10-22 DIAGNOSIS — F32A Depression, unspecified: Secondary | ICD-10-CM | POA: Diagnosis not present

## 2024-10-22 DIAGNOSIS — Z6830 Body mass index (BMI) 30.0-30.9, adult: Secondary | ICD-10-CM

## 2024-10-22 DIAGNOSIS — E66811 Obesity, class 1: Secondary | ICD-10-CM | POA: Diagnosis not present

## 2024-10-22 DIAGNOSIS — Z0001 Encounter for general adult medical examination with abnormal findings: Secondary | ICD-10-CM | POA: Diagnosis not present

## 2024-10-22 DIAGNOSIS — E782 Mixed hyperlipidemia: Secondary | ICD-10-CM

## 2024-10-22 DIAGNOSIS — F5104 Psychophysiologic insomnia: Secondary | ICD-10-CM

## 2024-10-22 DIAGNOSIS — K219 Gastro-esophageal reflux disease without esophagitis: Secondary | ICD-10-CM

## 2024-10-22 DIAGNOSIS — M47816 Spondylosis without myelopathy or radiculopathy, lumbar region: Secondary | ICD-10-CM

## 2024-10-22 DIAGNOSIS — Z0000 Encounter for general adult medical examination without abnormal findings: Secondary | ICD-10-CM

## 2024-10-22 DIAGNOSIS — Z23 Encounter for immunization: Secondary | ICD-10-CM | POA: Diagnosis not present

## 2024-10-22 DIAGNOSIS — F419 Anxiety disorder, unspecified: Secondary | ICD-10-CM

## 2024-10-22 DIAGNOSIS — R7303 Prediabetes: Secondary | ICD-10-CM

## 2024-10-22 DIAGNOSIS — F410 Panic disorder [episodic paroxysmal anxiety] without agoraphobia: Secondary | ICD-10-CM

## 2024-10-22 MED ORDER — CEFADROXIL 500 MG PO CAPS: 500.0000 mg | ORAL_CAPSULE | Freq: Two times a day (BID) | ORAL | 0 refills | Status: AC

## 2024-10-22 MED ORDER — TOPIRAMATE 25 MG PO TABS: ORAL_TABLET | ORAL | 2 refills | Status: AC

## 2024-10-22 MED ORDER — TRAZODONE HCL 100 MG PO TABS: 100.0000 mg | ORAL_TABLET | Freq: Every day | ORAL | 1 refills | Status: AC

## 2024-10-22 MED ORDER — OMEPRAZOLE 40 MG PO CPDR: 40.0000 mg | DELAYED_RELEASE_CAPSULE | Freq: Every day | ORAL | 1 refills | Status: AC

## 2024-10-22 NOTE — Progress Notes (Signed)
 "    Complete physical exam   Patient: Melanie Whitaker   DOB: Dec 07, 1985   39 y.o. Female  MRN: [id number] Visit Date: 10/22/2024  Today's healthcare provider: LAURAINE LOISE BUOY, DO   Chief Complaint  Patient presents with   Annual Exam    Last Exam Physical:09/12/2023 Diet:Regular Exercise: Feeling:well Sleeping:well Patient has concerns today. She has a blister that doesn't go away, left forearm. Patient would like to know if she needs to continue Wellbutrin . Immunizations: Up to date   Medication Refill    Needs refill on the Trazodone .   Subjective    Melanie Whitaker is a 40 y.o. female who presents today for a complete physical exam.   Medication Refill Associated symptoms include headaches. Pertinent negatives include no abdominal pain, arthralgias, chest pain, chills, congestion, coughing, fatigue, fever, joint swelling, nausea, numbness, rash, sore throat or weakness.   HPI     Annual Exam    Additional comments: Last Exam Physical:09/12/2023 Diet:Regular Exercise: Feeling:well Sleeping:well Patient has concerns today. She has a blister that doesn't go away, left forearm. Patient would like to know if she needs to continue Wellbutrin . Immunizations: Up to date        Medication Refill    Additional comments: Needs refill on the Trazodone .      Last edited by Rosas, Joseline E, CMA on 10/22/2024  3:24 PM.       Melanie Whitaker is a 39 year old female who presents for an annual physical exam and evaluation of a persistent sore spot following dental surgery.  She has a persistent sore spot on her arm that developed after dental surgery on October 01, 2024. Initially, it appeared as a blister and was accidentally opened in the shower. The area remains hot to the touch and has not improved despite using peroxide, triple antibiotic ointment, and A&D ointment.  She experiences severe headaches. She was previously on Wegovy  for weight loss, which was effective, but had  to stop due to insurance issues. She was then prescribed Wellbutrin  and naloxone, but has been off Wellbutrin  for about a week after running out. She also ran out of trazodone , which she was taking at 100 mg for sleep.  She has a history of HPV and is due for her second HPV vaccine. She wants to stay on top of vaccinations, especially for her children, although she has reservations about the COVID vaccine due to observed reactions in others.  She reports a family history of diabetes, with her daughter and father affected, and expresses concern about her own risk due to persistent thirst and fatigue after eating. She notes a history of thyroid  issues many years ago but no recent problems.  She experiences heart palpitations and occasional panic attacks, for which she was prescribed buspirone . She has tapered her use of buspirone  to as-needed due to side effects. She also mentions a history of seizures.  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 She has arthritis in her back, which is exacerbated by her work activities.     Past Medical History:  Diagnosis Date   Allergy    Anemia    Anxiety    Depression    GERD (gastroesophageal reflux disease)    Manic depression (HCC)    Pneumonia    covid   Past Surgical History:  Procedure Laterality Date   ABDOMINAL HYSTERECTOMY  05/05/23   APPENDECTOMY     CHOLECYSTECTOMY  LAPAROSCOPIC VAGINAL HYSTERECTOMY WITH SALPINGECTOMY Bilateral 05/05/2023   Procedure: LAPAROSCOPIC ASSISTED VAGINAL HYSTERECTOMY WITH BILATERAL SALPINGECTOMY;  Surgeon: Connell Davies, MD;  Location: [facility];  Service: Gynecology;  Laterality: Bilateral;   TONSILLECTOMY     TUBAL LIGATION     Social History   Socioeconomic History   Marital status: Single    Spouse name: Not on file   Number of children: 3   Years of education: Not on file   Highest education level: 9th grade  Occupational History    Not on file  Tobacco Use   Smoking status: Never   Smokeless tobacco: Never  Vaping Use   Vaping status: Never Used  Substance and Sexual Activity   Alcohol use: Never   Drug use: Never   Sexual activity: Yes    Birth control/protection: None    Comment: tubaligation  Other Topics Concern   Not on file  Social History Narrative   Not on file   Social Drivers of Health   Tobacco Use: Low Risk (10/22/2024)   Patient History    Smoking Tobacco Use: Never    Smokeless Tobacco Use: Never    Passive Exposure: Not on file  Financial Resource Strain: Medium Risk (08/04/2024)   Overall Financial Resource Strain (CARDIA)    Difficulty of Paying Living Expenses: Somewhat hard  Food Insecurity: No Food Insecurity (08/10/2024)   Epic    Worried About Radiation Protection Practitioner of Food in the Last Year: Never true    Ran Out of Food in the Last Year: Never true  Transportation Needs: No Transportation Needs (08/10/2024)   Epic    Lack of Transportation (Medical): No    Lack of Transportation (Non-Medical): No  Physical Activity: Sufficiently Active (08/04/2024)   Exercise Vital Sign    Days of Exercise per Week: 5 days    Minutes of Exercise per Session: 90 min  Stress: Stress Concern Present (08/04/2024)   [employer] of Occupational Health - Occupational Stress Questionnaire    Feeling of Stress: To some extent  Social Connections: Moderately Isolated (08/04/2024)   Social Connection and Isolation Panel    Frequency of Communication with Friends and Family: Three times a week    Frequency of Social Gatherings with Friends and Family: Never    Attends Religious Services: Never    Database Administrator or Organizations: No    Attends Engineer, Structural: Not on file    Marital Status: Married  Catering Manager Violence: Not At Risk (08/10/2024)   Epic    Fear of Current or Ex-Partner: No    Emotionally Abused: No    Physically Abused: No    Sexually Abused: No  Depression  (PHQ2-9): High Risk (10/22/2024)   Depression (PHQ2-9)    PHQ-2 Score: 13  Alcohol Screen: Low Risk (08/04/2024)   Alcohol Screen    Last Alcohol Screening Score (AUDIT): 0  Housing: Low Risk (08/10/2024)   Epic    Unable to Pay for Housing in the Last Year: No    Number of Times Moved in the Last Year: 0    Homeless in the Last Year: No  Recent Concern: Housing - High Risk (08/04/2024)   Epic    Unable to Pay for Housing in the Last Year: Yes    Number of Times Moved in the Last Year: Not on file    Homeless in the Last Year: No  Utilities: Not At Risk (08/10/2024)   Epic    Threatened with loss  of utilities: No  Health Literacy: Adequate Health Literacy (08/04/2024)   B1300 Health Literacy    Frequency of need for help with medical instructions: Never   Family Status  Relation Name Status   Mother Monta sink (Not Specified)   Father yareliz thorstenson Deceased   Daughter Romell love (Not Specified)   Son Veterinary Surgeon love (Not Specified)   Mat Aunt Diane phollips (Not Specified)   Mat Uncle  (Not Specified)   Bruna Nyhan  (Not Specified)   Pat Uncle Darina abbot (Not Specified)   MGM  (Not Specified)   MGF Derrill taylor (Not Specified)   PGM Sherlene Dowd (Not Specified)   PGF  (Not Specified)  No partnership data on file   Family History  Problem Relation Age of Onset   COPD Mother    Uterine cancer Mother 23   Colon cancer Mother 13       MSH2+ Lynch syndrome   Diabetes Father    COPD Father    Crohn's disease Father    Heart attack Father    Diabetes Daughter    ADD / ADHD Son    Stroke Maternal Aunt    Crohn's disease Maternal Uncle    Breast cancer Paternal Aunt    Diabetes Paternal Uncle    Crohn's disease Paternal Uncle    Colon cancer Maternal Grandmother    Diabetes Maternal Grandfather    Diabetes Paternal Grandmother    Heart failure Paternal Grandmother    Dementia Paternal Grandmother    Heart failure Paternal Grandfather    Lung cancer Paternal  Grandfather    Allergies[1]  Patient Care Team: Etrulia Zarr, Lauraine SAILOR, DO as PCP - General (Family Medicine)   Medications: Show/hide medication list[2]  Review of Systems  Constitutional:  Negative for chills, fatigue and fever.  HENT:  Negative for congestion, ear pain, rhinorrhea, sneezing and sore throat.   Eyes: Negative.  Negative for pain and redness.  Respiratory:  Negative for cough, shortness of breath and wheezing.   Cardiovascular:  Negative for chest pain and leg swelling.  Gastrointestinal:  Negative for abdominal pain, blood in stool, constipation, diarrhea and nausea.  Endocrine: Negative for polydipsia and polyphagia.  Genitourinary: Negative.  Negative for dysuria, flank pain, hematuria, pelvic pain, vaginal bleeding and vaginal discharge.  Musculoskeletal:  Negative for arthralgias, back pain, gait problem and joint swelling.  Skin:  Negative for rash.  Neurological:  Positive for headaches. Negative for dizziness, tremors, seizures, weakness, light-headedness and numbness.  Hematological:  Negative for adenopathy.  Psychiatric/Behavioral: Negative.  Negative for behavioral problems, confusion and dysphoric mood. The patient is not nervous/anxious and is not hyperactive.       Objective    BP 120/85 (BP Location: Left Arm, Patient Position: Sitting, Cuff Size: Large)   Pulse (!) 111   Resp 16   Ht 5' 10 (1.778 m)   Wt 239 lb 12.8 oz (108.8 kg)   LMP 03/28/2023   SpO2 96%   BMI 34.41 kg/m    Physical Exam Vitals and nursing note reviewed.  Constitutional:      General: She is awake.     Appearance: Normal appearance. She is morbidly obese.  HENT:     Head: Normocephalic and atraumatic.     Right Ear: Tympanic membrane, ear canal and external ear normal.     Left Ear: Tympanic membrane, ear canal and external ear normal.     Nose: Nose normal.     Mouth/Throat:  Mouth: Mucous membranes are moist.     Pharynx: Oropharynx is clear. No oropharyngeal  exudate or posterior oropharyngeal erythema.  Eyes:     General: No scleral icterus.    Extraocular Movements: Extraocular movements intact.     Conjunctiva/sclera: Conjunctivae normal.     Pupils: Pupils are equal, round, and reactive to light.  Neck:     Thyroid : No thyromegaly or thyroid  tenderness.  Cardiovascular:     Rate and Rhythm: Normal rate and regular rhythm.     Pulses: Normal pulses.     Heart sounds: Normal heart sounds.  Pulmonary:     Effort: Pulmonary effort is normal. No tachypnea, bradypnea or respiratory distress.     Breath sounds: Normal breath sounds. No stridor. No wheezing, rhonchi or rales.  Abdominal:     General: Bowel sounds are normal. There is no distension.     Palpations: Abdomen is soft. There is no mass.     Tenderness: There is no abdominal tenderness. There is no guarding.     Hernia: No hernia is present.  Musculoskeletal:     Cervical back: Normal range of motion and neck supple.     Right lower leg: No edema.     Left lower leg: No edema.  Lymphadenopathy:     Cervical: No cervical adenopathy.  Skin:    General: Skin is warm and dry.         Comments: Small scab (approx. 6mm x 3mm)  with erythema surrounding it (2-3 mm thick)  Neurological:     Mental Status: She is alert and oriented to person, place, and time. Mental status is at baseline.  Psychiatric:        Mood and Affect: Mood normal.        Behavior: Behavior normal.      Last depression screening scores    10/22/2024    3:22 PM 08/04/2024    3:53 PM 04/22/2024    8:23 AM  PHQ 2/9 Scores  PHQ - 2 Score 4 2 2   PHQ- 9 Score 13 6  9       Data saved with a previous flowsheet row definition   Last fall risk screening    10/22/2024    3:22 PM  Fall Risk   Falls in the past year? 0  Number falls in past yr: 0  Injury with Fall? 0  Risk for fall due to : No Fall Risks  Follow up Falls evaluation completed   Last Audit-C alcohol use screening    08/04/2024    3:29 PM   Alcohol Use Disorder Test (AUDIT)  1. How often do you have a drink containing alcohol? 0   2. How many drinks containing alcohol do you have on a typical day when you are drinking? 0   3. How often do you have six or more drinks on one occasion? 0   AUDIT-C Score 0      Manually entered by patient   A score of 3 or more in women, and 4 or more in men indicates increased risk for alcohol abuse, EXCEPT if all of the points are from question 1   Results for orders placed or performed in visit on 10/22/24  Lipid panel  Result Value Ref Range   Cholesterol, Total 188 100 - 199 mg/dL   Triglycerides 816 (H) 0 - 149 mg/dL   HDL 42 >60 mg/dL   VLDL Cholesterol Cal 32 5 - 40 mg/dL   LDL Chol  Calc (NIH) 114 (H) 0 - 99 mg/dL   Chol/HDL Ratio 4.5 (H) 0.0 - 4.4 ratio  Hemoglobin A1c  Result Value Ref Range   Hgb A1c MFr Bld 5.4 4.8 - 5.6 %   Est. average glucose Bld gHb Est-mCnc 108 mg/dL    Assessment & Plan    Routine Health Maintenance and Physical Exam  Exercise Activities and Dietary recommendations  Goals   None     Immunization History  Administered Date(s) Administered   H1N1 12/19/2008   HPV 9-valent 04/22/2024, 10/22/2024   Hepatitis B, PED/ADOLESCENT 08/02/1996, 09/06/1996, 02/14/1997   Influenza, Seasonal, Injecte, Preservative Fre 09/12/2023, 10/22/2024   Influenza-Unspecified 12/19/2008, 07/18/2009, 09/18/2011   Tdap 02/11/2012, 12/20/2022    Health Maintenance  Topic Date Due   HPV VACCINES (3 - Risk 3-dose series) 02/19/2025   COVID-19 Vaccine (1) 03/21/2025 (Originally 05/24/1984)   Mammogram  12/04/2025   Cervical Cancer Screening (HPV/Pap Cotest)  02/21/2028   DTaP/Tdap/Td (3 - Td or Tdap) 12/19/2032   Influenza Vaccine  Completed   Hepatitis B Vaccines 19-59 Average Risk  Completed   Hepatitis C Screening  Completed   HIV Screening  Completed   Pneumococcal Vaccine  Aged Out   Meningococcal B Vaccine  Aged Out    Discussed health benefits of physical  activity, and encouraged her to engage in regular exercise appropriate for her age and condition.   Annual physical exam  Cellulitis of left upper extremity -     Cefadroxil ; Take 1 capsule (500 mg total) by mouth 2 (two) times daily for 7 days.  Dispense: 14 capsule; Refill: 0  Psychophysiological insomnia -     traZODone  HCl; Take 1 tablet (100 mg total) by mouth at bedtime.  Dispense: 90 tablet; Refill: 1  Moderate mixed hyperlipidemia not requiring statin therapy -     Lipid panel  Prediabetes -     Hemoglobin A1c  Obesity (BMI 30.0-34.9) -     Topiramate ; Week 1: Take 1 tablet daily.  Week 2: Take 2 tablets daily.  Week 3: Take 3 tablets daily.  Week 4: Take 4 tablets daily.  If unable to tolerate higher dose, stay at maximum tolerated dose.  Dispense: 120 tablet; Refill: 2  Anxiety and depression  Panic attacks  Arthritis of lumbar spine  GERD with apnea -     Omeprazole ; Take 1 capsule (40 mg total) by mouth daily.  Dispense: 90 capsule; Refill: 1  Immunization due -     Flu vaccine trivalent PF, 6mos and older(Flulaval,Afluria,Fluarix,Fluzone) -     HPV 9-valent vaccine,Recombinat  Need for HPV vaccine -     HPV 9-valent vaccine,Recombinat      Annual physical exam Physical exam overall unremarkable except as noted above. Routine lab work ordered as noted. Due for second HPV vaccine, importance discussed given personal history of HPV. - Administered second HPV vaccine.   Cellulitis of left upper extremity Persistent lesion which developed post-dental surgery and has been unresponsive to peroxide, triple antibiotic, and A&D ointment.  Psychophysiologic insomnia Chronic insomnia previously managed with trazodone , discontinued due to pharmacy visit requirement. - Schedule visit to refill trazodone  prescription.  Moderate mixed hyperlipidemia not requiring statin therapy - Recheck cholesterol panel.  Prediabetes Family history of diabetes, increased thirst,  postprandial somnolence, concerns about diabetes diagnosis and GLP-1 agonists coverage, prefers to avoid metformin due to side effect concerns. - Recheck hemoglobin A1c today.  Obesity (BMI 30.0-34.9) Weight gain post-Wegovy  discontinuation, discussed alternative weight management options including topiramate   with caution for side effects. - Prescribe topiramate  for weight management, titrate dose to tolerance.  Anxiety and depression; panic attacks Chronic depression and anxiety managed with citalopram , bupropion  discontinued due to lack of efficacy and insurance issues.  Intermittent panic attacks featuring intermittent palpitations and tachycardia with visual disturbances managed with buspirone  as needed. - Continue citalopram  30 mg daily. - Continue buspirone  as needed for palpitations and panic attacks.  Arthritis of lumbar spine Chronic lumbar arthritis with right-sided pain, limited physical activity due to work. - Recommended core strengthening exercises to support lumbar spine. - Provided printed handout and went over with patient.  GERD with apnea Chronic, well-managed with omeprazole .  Refill omeprazole  today.  No changes today.    Return in about 3 months (around 01/20/2025) for Weight, Chronic f/u w/Antwain Caliendo or next provider, per pt preference.     I discussed the assessment and treatment plan with the patient  The patient was provided an opportunity to ask questions and all were answered. The patient agreed with the plan and demonstrated an understanding of the instructions.   The patient was advised to call back or seek an in-person evaluation if the symptoms worsen or if the condition fails to improve as anticipated.    LAURAINE LOISE BUOY, DO  [facility] [phone] (phone) [phone] (fax)  [facility]     [1]  Allergies Allergen Reactions   Lexapro [Escitalopram Oxalate] Other (See Comments)    SI during use    Penicillins Anaphylaxis    Hives and throat was closing - went to ER as a child.   Sulfa Antibiotics Anaphylaxis   Phenergan [Promethazine Hcl] Other (See Comments)    halluciations   [2]  Outpatient Medications Prior to Visit  Medication Sig Note   albuterol  (VENTOLIN  HFA) 108 (90 Base) MCG/ACT inhaler Inhale 2 puffs into the lungs every 6 (six) hours as needed for up to 7 days for wheezing or shortness of breath.    citalopram  (CELEXA ) 10 MG tablet Take 1 tablet (10 mg total) by mouth daily. Take in addition to 20 mg for total of 30 mg daily.    citalopram  (CELEXA ) 20 MG tablet Take 1 tablet (20 mg total) by mouth daily.    gabapentin  (NEURONTIN ) 100 MG capsule Take 2 capsules (200 mg total) by mouth 3 (three) times daily.    naltrexone  (DEPADE) 50 MG tablet Take 1 tablet (50 mg total) by mouth daily.    prochlorperazine  (COMPAZINE ) 5 MG tablet Take 1 tablet (5 mg total) by mouth every 6 (six) hours as needed for nausea or vomiting.    [DISCONTINUED] pantoprazole  (PROTONIX ) 40 MG tablet Take 1 tablet (40 mg total) by mouth daily. 10/22/2024: gas   buPROPion  (WELLBUTRIN  XL) 150 MG 24 hr tablet Take 1 tablet (150 mg total) by mouth daily.    [DISCONTINUED] semaglutide -weight management (WEGOVY ) 1.7 MG/0.75ML SOAJ SQ injection Inject 1.7 mg into the skin once a week.    No facility-administered medications prior to visit.   "

## 2024-10-23 LAB — LIPID PANEL
Chol/HDL Ratio: 4.5 ratio — ABNORMAL HIGH (ref 0.0–4.4)
Cholesterol, Total: 188 mg/dL (ref 100–199)
HDL: 42 mg/dL
LDL Chol Calc (NIH): 114 mg/dL — ABNORMAL HIGH (ref 0–99)
Triglycerides: 183 mg/dL — ABNORMAL HIGH (ref 0–149)
VLDL Cholesterol Cal: 32 mg/dL (ref 5–40)

## 2024-10-23 LAB — HEMOGLOBIN A1C
Est. average glucose Bld gHb Est-mCnc: 108 mg/dL
Hgb A1c MFr Bld: 5.4 % (ref 4.8–5.6)

## 2024-10-30 ENCOUNTER — Ambulatory Visit: Payer: Self-pay | Admitting: Family Medicine

## 2025-01-21 ENCOUNTER — Encounter
